# Patient Record
Sex: Female | Born: 1935 | Hispanic: No | Smoking: Former smoker
Health system: Southern US, Community
[De-identification: ages and names within clinical notes are randomized; demographics above are authoritative.]

## PROBLEM LIST (undated history)

## (undated) DIAGNOSIS — R410 Disorientation, unspecified: Secondary | ICD-10-CM

## (undated) DIAGNOSIS — I10 Essential (primary) hypertension: Secondary | ICD-10-CM

## (undated) DIAGNOSIS — R5383 Other fatigue: Secondary | ICD-10-CM

## (undated) DIAGNOSIS — R413 Other amnesia: Secondary | ICD-10-CM

## (undated) DIAGNOSIS — F0390 Unspecified dementia without behavioral disturbance: Secondary | ICD-10-CM

## (undated) DIAGNOSIS — K219 Gastro-esophageal reflux disease without esophagitis: Secondary | ICD-10-CM

## (undated) DIAGNOSIS — M722 Plantar fascial fibromatosis: Secondary | ICD-10-CM

## (undated) DIAGNOSIS — M7918 Myalgia, other site: Secondary | ICD-10-CM

## (undated) DIAGNOSIS — N6009 Solitary cyst of unspecified breast: Secondary | ICD-10-CM

## (undated) DIAGNOSIS — R739 Hyperglycemia, unspecified: Secondary | ICD-10-CM

## (undated) DIAGNOSIS — G43909 Migraine, unspecified, not intractable, without status migrainosus: Secondary | ICD-10-CM

## (undated) DIAGNOSIS — IMO0002 Reserved for concepts with insufficient information to code with codable children: Secondary | ICD-10-CM

## (undated) DIAGNOSIS — K635 Polyp of colon: Secondary | ICD-10-CM

## (undated) DIAGNOSIS — D649 Anemia, unspecified: Secondary | ICD-10-CM

## (undated) DIAGNOSIS — R58 Hemorrhage, not elsewhere classified: Secondary | ICD-10-CM

## (undated) DIAGNOSIS — R232 Flushing: Secondary | ICD-10-CM

## (undated) DIAGNOSIS — F32A Depression, unspecified: Secondary | ICD-10-CM

## (undated) DIAGNOSIS — Z853 Personal history of malignant neoplasm of breast: Secondary | ICD-10-CM

## (undated) DIAGNOSIS — K279 Peptic ulcer, site unspecified, unspecified as acute or chronic, without hemorrhage or perforation: Secondary | ICD-10-CM

## (undated) DIAGNOSIS — F329 Major depressive disorder, single episode, unspecified: Secondary | ICD-10-CM

## (undated) DIAGNOSIS — M8580 Other specified disorders of bone density and structure, unspecified site: Secondary | ICD-10-CM

## (undated) DIAGNOSIS — J302 Other seasonal allergic rhinitis: Secondary | ICD-10-CM

## (undated) DIAGNOSIS — N390 Urinary tract infection, site not specified: Secondary | ICD-10-CM

## (undated) HISTORY — PX: OTHER SURGICAL HISTORY: SHX169

## (undated) HISTORY — DX: Unspecified dementia, unspecified severity, without behavioral disturbance, psychotic disturbance, mood disturbance, and anxiety: F03.90

## (undated) HISTORY — DX: Gastro-esophageal reflux disease without esophagitis: K21.9

## (undated) HISTORY — DX: Essential (primary) hypertension: I10

## (undated) HISTORY — DX: Flushing: R23.2

## (undated) HISTORY — DX: Other specified disorders of bone density and structure, unspecified site: M85.80

## (undated) HISTORY — DX: Plantar fascial fibromatosis: M72.2

## (undated) HISTORY — DX: Anemia, unspecified: D64.9

## (undated) HISTORY — DX: Myalgia, other site: M79.18

## (undated) HISTORY — DX: Urinary tract infection, site not specified: N39.0

## (undated) HISTORY — DX: Migraine, unspecified, not intractable, without status migrainosus: G43.909

## (undated) HISTORY — DX: Hyperglycemia, unspecified: R73.9

## (undated) HISTORY — DX: Depression, unspecified: F32.A

## (undated) HISTORY — DX: Other seasonal allergic rhinitis: J30.2

## (undated) HISTORY — DX: Personal history of malignant neoplasm of breast: Z85.3

## (undated) HISTORY — DX: Reserved for concepts with insufficient information to code with codable children: IMO0002

## (undated) HISTORY — DX: Polyp of colon: K63.5

## (undated) HISTORY — DX: Peptic ulcer, site unspecified, unspecified as acute or chronic, without hemorrhage or perforation: K27.9

## (undated) HISTORY — DX: Major depressive disorder, single episode, unspecified: F32.9

## (undated) HISTORY — DX: Solitary cyst of unspecified breast: N60.09

## (undated) HISTORY — DX: Hemorrhage, not elsewhere classified: R58

## (undated) HISTORY — PX: DILATION AND CURETTAGE OF UTERUS: SHX78

## (undated) LAB — HEPATIC FUNCTION PANEL
ALT: 9 U/L
AST: 18 U/L
Albumin: 3.8 g/dL (ref 3.5–5.0)
Alkaline Phosphatase: 100 U/L
Total Bilirubin: 0.4 mg/dL (ref 0.1–1.4)
Total Protein: 6.8 g/dL (ref 6.4–8.2)

## (undated) LAB — CBC
Hematocrit: 38.3 % (ref 36–46)
Hemoglobin: 12.4 g/dL (ref 12.0–16.0)
MCV: 94.8 fL (ref 82.0–108.0)
Platelets: 218
RBC: 4.04 10*6/uL (ref 4.00–5.20)
WBC: 8.48 10^3/mL

## (undated) LAB — RENAL FUNCTION PANEL W/EGFR
Anion Gap: 12 mmol/L (ref ?–30)
BUN: 15 mg/dL (ref 4–21)
CO2: 24 mmol/L (ref 13–22)
Calcium: 9.4 mg/dL (ref 8.7–10.7)
Chloride: 101 mmol/L (ref 99–108)
Creatinine: 0.9 mg/dL (ref 0.5–1.1)
Potassium: 4.1 mmol/L (ref 3.4–5.3)
Sodium: 137 mmol/L (ref 137–147)
eGFR NONAA CKD-EPI: 58.9

## (undated) LAB — TSH: TSH: 2.06 u[IU]/mL (ref 0.41–5.90)

## (undated) LAB — GLUCOSE, RANDOM: Glucose: 130 mg/dL (ref 60–200)

---

## 1984-01-31 HISTORY — PX: DILATION AND CURETTAGE OF UTERUS: SHX78

## 1991-01-31 DIAGNOSIS — R58 Hemorrhage, not elsewhere classified: Secondary | ICD-10-CM

## 1991-01-31 HISTORY — DX: Hemorrhage, not elsewhere classified: R58

## 1992-08-30 DIAGNOSIS — K279 Peptic ulcer, site unspecified, unspecified as acute or chronic, without hemorrhage or perforation: Secondary | ICD-10-CM

## 1992-08-30 HISTORY — DX: Peptic ulcer, site unspecified, unspecified as acute or chronic, without hemorrhage or perforation: K27.9

## 1993-12-30 DIAGNOSIS — N6009 Solitary cyst of unspecified breast: Secondary | ICD-10-CM

## 1993-12-30 HISTORY — DX: Solitary cyst of unspecified breast: N60.09

## 1999-03-29 ENCOUNTER — Encounter: Payer: Self-pay | Admitting: *Deleted

## 1999-03-29 ENCOUNTER — Encounter: Admission: RE | Admit: 1999-03-29 | Discharge: 1999-03-29 | Payer: Self-pay | Admitting: *Deleted

## 2000-03-29 ENCOUNTER — Encounter: Payer: Self-pay | Admitting: *Deleted

## 2000-03-29 ENCOUNTER — Encounter: Admission: RE | Admit: 2000-03-29 | Discharge: 2000-03-29 | Payer: Self-pay | Admitting: *Deleted

## 2000-10-12 ENCOUNTER — Encounter: Admission: RE | Admit: 2000-10-12 | Discharge: 2000-10-12 | Payer: Self-pay | Admitting: *Deleted

## 2001-04-02 ENCOUNTER — Encounter: Payer: Self-pay | Admitting: *Deleted

## 2001-04-02 ENCOUNTER — Encounter: Admission: RE | Admit: 2001-04-02 | Discharge: 2001-04-02 | Payer: Self-pay | Admitting: *Deleted

## 2001-04-15 ENCOUNTER — Other Ambulatory Visit: Admission: RE | Admit: 2001-04-15 | Discharge: 2001-04-15 | Payer: Self-pay | Admitting: *Deleted

## 2002-04-04 ENCOUNTER — Encounter: Admission: RE | Admit: 2002-04-04 | Discharge: 2002-04-04 | Payer: Self-pay | Admitting: Internal Medicine

## 2002-04-04 ENCOUNTER — Encounter: Payer: Self-pay | Admitting: *Deleted

## 2002-04-18 ENCOUNTER — Other Ambulatory Visit: Admission: RE | Admit: 2002-04-18 | Discharge: 2002-04-18 | Payer: Self-pay | Admitting: *Deleted

## 2003-03-26 ENCOUNTER — Encounter (INDEPENDENT_AMBULATORY_CARE_PROVIDER_SITE_OTHER): Payer: Self-pay | Admitting: *Deleted

## 2003-03-26 ENCOUNTER — Ambulatory Visit (HOSPITAL_COMMUNITY): Admission: RE | Admit: 2003-03-26 | Discharge: 2003-03-26 | Payer: Self-pay | Admitting: Gastroenterology

## 2003-04-08 ENCOUNTER — Ambulatory Visit (HOSPITAL_COMMUNITY): Admission: RE | Admit: 2003-04-08 | Discharge: 2003-04-08 | Payer: Self-pay | Admitting: Internal Medicine

## 2003-04-20 ENCOUNTER — Other Ambulatory Visit: Admission: RE | Admit: 2003-04-20 | Discharge: 2003-04-20 | Payer: Self-pay | Admitting: *Deleted

## 2003-07-13 ENCOUNTER — Encounter: Admission: RE | Admit: 2003-07-13 | Discharge: 2003-07-13 | Payer: Self-pay | Admitting: Internal Medicine

## 2004-04-22 ENCOUNTER — Ambulatory Visit (HOSPITAL_COMMUNITY): Admission: RE | Admit: 2004-04-22 | Discharge: 2004-04-22 | Payer: Self-pay | Admitting: *Deleted

## 2004-04-28 ENCOUNTER — Other Ambulatory Visit: Admission: RE | Admit: 2004-04-28 | Discharge: 2004-04-28 | Payer: Self-pay | Admitting: *Deleted

## 2005-01-15 ENCOUNTER — Encounter: Admission: RE | Admit: 2005-01-15 | Discharge: 2005-01-15 | Payer: Self-pay | Admitting: Internal Medicine

## 2005-04-25 ENCOUNTER — Ambulatory Visit (HOSPITAL_COMMUNITY): Admission: RE | Admit: 2005-04-25 | Discharge: 2005-04-25 | Payer: Self-pay | Admitting: Obstetrics and Gynecology

## 2005-05-01 ENCOUNTER — Other Ambulatory Visit: Admission: RE | Admit: 2005-05-01 | Discharge: 2005-05-01 | Payer: Self-pay | Admitting: Obstetrics and Gynecology

## 2006-01-30 DIAGNOSIS — Z853 Personal history of malignant neoplasm of breast: Secondary | ICD-10-CM

## 2006-01-30 HISTORY — DX: Personal history of malignant neoplasm of breast: Z85.3

## 2006-01-30 HISTORY — PX: BREAST EXCISIONAL BIOPSY: SUR124

## 2006-04-27 ENCOUNTER — Ambulatory Visit (HOSPITAL_COMMUNITY): Admission: RE | Admit: 2006-04-27 | Discharge: 2006-04-27 | Payer: Self-pay | Admitting: Obstetrics & Gynecology

## 2006-05-08 ENCOUNTER — Encounter (INDEPENDENT_AMBULATORY_CARE_PROVIDER_SITE_OTHER): Payer: Self-pay | Admitting: Diagnostic Radiology

## 2006-05-08 ENCOUNTER — Encounter (INDEPENDENT_AMBULATORY_CARE_PROVIDER_SITE_OTHER): Payer: Self-pay | Admitting: Specialist

## 2006-05-08 ENCOUNTER — Encounter: Admission: RE | Admit: 2006-05-08 | Discharge: 2006-05-08 | Payer: Self-pay | Admitting: Obstetrics & Gynecology

## 2006-05-10 ENCOUNTER — Other Ambulatory Visit: Admission: RE | Admit: 2006-05-10 | Discharge: 2006-05-10 | Payer: Self-pay | Admitting: Obstetrics & Gynecology

## 2006-05-14 ENCOUNTER — Encounter: Admission: RE | Admit: 2006-05-14 | Discharge: 2006-05-14 | Payer: Self-pay | Admitting: Obstetrics & Gynecology

## 2006-06-04 ENCOUNTER — Encounter: Admission: RE | Admit: 2006-06-04 | Discharge: 2006-06-04 | Payer: Self-pay | Admitting: Surgery

## 2006-06-06 ENCOUNTER — Ambulatory Visit (HOSPITAL_BASED_OUTPATIENT_CLINIC_OR_DEPARTMENT_OTHER): Admission: RE | Admit: 2006-06-06 | Discharge: 2006-06-06 | Payer: Self-pay | Admitting: Surgery

## 2006-06-06 ENCOUNTER — Encounter (INDEPENDENT_AMBULATORY_CARE_PROVIDER_SITE_OTHER): Payer: Self-pay | Admitting: Specialist

## 2006-06-06 ENCOUNTER — Encounter: Payer: Self-pay | Admitting: Surgery

## 2006-06-06 ENCOUNTER — Encounter: Admission: RE | Admit: 2006-06-06 | Discharge: 2006-06-06 | Payer: Self-pay | Admitting: Surgery

## 2006-06-06 HISTORY — PX: BREAST LUMPECTOMY: SHX2

## 2006-06-08 ENCOUNTER — Ambulatory Visit: Payer: Self-pay | Admitting: Oncology

## 2006-06-11 ENCOUNTER — Ambulatory Visit: Admission: RE | Admit: 2006-06-11 | Discharge: 2006-08-15 | Payer: Self-pay | Admitting: Radiation Oncology

## 2006-10-05 ENCOUNTER — Encounter: Admission: RE | Admit: 2006-10-05 | Discharge: 2006-10-05 | Payer: Self-pay | Admitting: General Surgery

## 2006-10-08 ENCOUNTER — Ambulatory Visit: Payer: Self-pay | Admitting: Oncology

## 2007-04-29 ENCOUNTER — Encounter: Admission: RE | Admit: 2007-04-29 | Discharge: 2007-04-29 | Payer: Self-pay | Admitting: Internal Medicine

## 2007-06-12 ENCOUNTER — Other Ambulatory Visit: Admission: RE | Admit: 2007-06-12 | Discharge: 2007-06-12 | Payer: Self-pay | Admitting: Obstetrics & Gynecology

## 2007-09-30 ENCOUNTER — Ambulatory Visit: Payer: Self-pay | Admitting: Oncology

## 2007-10-03 LAB — CBC WITH DIFFERENTIAL/PLATELET
BASO%: 0.2 % (ref 0.0–2.0)
Eosinophils Absolute: 0.1 10*3/uL (ref 0.0–0.5)
MCHC: 34.4 g/dL (ref 32.0–36.0)
MONO#: 0.4 10*3/uL (ref 0.1–0.9)
NEUT#: 3.8 10*3/uL (ref 1.5–6.5)
RBC: 4.02 10*6/uL (ref 3.70–5.32)
WBC: 6.1 10*3/uL (ref 3.9–10.0)
lymph#: 1.8 10*3/uL (ref 0.9–3.3)

## 2007-10-04 LAB — COMPREHENSIVE METABOLIC PANEL
ALT: <8 U/L (ref 0–35)
Albumin: 4.1 g/dL (ref 3.5–5.2)
CO2: 29 mEq/L (ref 19–32)
Calcium: 9.3 mg/dL (ref 8.4–10.5)
Chloride: 99 mEq/L (ref 96–112)
Potassium: 3.8 mEq/L (ref 3.5–5.3)
Sodium: 137 mEq/L (ref 135–145)
Total Protein: 6.9 g/dL (ref 6.0–8.3)

## 2008-04-30 ENCOUNTER — Encounter: Admission: RE | Admit: 2008-04-30 | Discharge: 2008-04-30 | Payer: Self-pay | Admitting: Internal Medicine

## 2008-11-16 ENCOUNTER — Ambulatory Visit: Payer: Self-pay | Admitting: Oncology

## 2008-11-18 LAB — COMPREHENSIVE METABOLIC PANEL
Albumin: 3.7 g/dL (ref 3.5–5.2)
Alkaline Phosphatase: 83 U/L (ref 39–117)
BUN: 15 mg/dL (ref 6–23)
Calcium: 9.2 mg/dL (ref 8.4–10.5)
Creatinine, Ser: 1.04 mg/dL (ref 0.40–1.20)
Glucose, Bld: 90 mg/dL (ref 70–99)
Potassium: 3.6 mEq/L (ref 3.5–5.3)

## 2008-11-18 LAB — CBC WITH DIFFERENTIAL/PLATELET
Basophils Absolute: 0 10*3/uL (ref 0.0–0.1)
EOS%: 1.4 % (ref 0.0–7.0)
Eosinophils Absolute: 0.1 10*3/uL (ref 0.0–0.5)
HCT: 34.2 % — ABNORMAL LOW (ref 34.8–46.6)
HGB: 11.6 g/dL (ref 11.6–15.9)
MCH: 30.8 pg (ref 25.1–34.0)
MCV: 90.9 fL (ref 79.5–101.0)
MONO%: 13.1 % (ref 0.0–14.0)
NEUT#: 3.1 10*3/uL (ref 1.5–6.5)
NEUT%: 54.7 % (ref 38.4–76.8)
Platelets: 238 10*3/uL (ref 145–400)
RDW: 13.6 % (ref 11.2–14.5)

## 2008-11-19 LAB — VITAMIN D 25 HYDROXY (VIT D DEFICIENCY, FRACTURES): Vit D, 25-Hydroxy: 25 ng/mL — ABNORMAL LOW (ref 30–89)

## 2009-05-03 ENCOUNTER — Encounter: Admission: RE | Admit: 2009-05-03 | Discharge: 2009-05-03 | Payer: Self-pay | Admitting: Surgery

## 2010-03-10 ENCOUNTER — Other Ambulatory Visit: Payer: Self-pay | Admitting: Internal Medicine

## 2010-03-10 DIAGNOSIS — N6315 Unspecified lump in the right breast, overlapping quadrants: Secondary | ICD-10-CM

## 2010-03-15 ENCOUNTER — Other Ambulatory Visit: Payer: Self-pay

## 2010-03-17 ENCOUNTER — Ambulatory Visit
Admission: RE | Admit: 2010-03-17 | Discharge: 2010-03-17 | Disposition: A | Discharge status: Home / Self Care | Payer: Medicare Other | Source: Ambulatory Visit | Attending: Internal Medicine | Admitting: Internal Medicine

## 2010-03-17 ENCOUNTER — Other Ambulatory Visit: Payer: Self-pay | Admitting: Internal Medicine

## 2010-03-17 DIAGNOSIS — N6315 Unspecified lump in the right breast, overlapping quadrants: Secondary | ICD-10-CM

## 2010-04-21 ENCOUNTER — Other Ambulatory Visit: Payer: Self-pay | Admitting: Surgery

## 2010-04-21 DIAGNOSIS — Z9889 Other specified postprocedural states: Secondary | ICD-10-CM

## 2010-05-09 ENCOUNTER — Ambulatory Visit
Admission: RE | Admit: 2010-05-09 | Discharge: 2010-05-09 | Disposition: A | Discharge status: Home / Self Care | Payer: Medicare Other | Source: Ambulatory Visit | Attending: Surgery | Admitting: Surgery

## 2010-05-09 DIAGNOSIS — Z9889 Other specified postprocedural states: Secondary | ICD-10-CM

## 2010-06-14 NOTE — Op Note (Signed)
[name], [name]            ACCOUNT NO.:  [id number]   MEDICAL RECORD NO.:  [id number]          PATIENT TYPE:  AMB   LOCATION:  DSC                          FACILITY:  [facility]   PHYSICIAN:  Christine Maldonado, M.D.DATE OF BIRTH:  09-30-1928   DATE OF PROCEDURE:  06/06/2006  DATE OF DISCHARGE:                               OPERATIVE REPORT   OFFICE MEDICAL RECORD NUMBER [phone].   PREOPERATIVE DIAGNOSIS:  Carcinoma, right breast upper inner quadrant.   POSTOPERATIVE DIAGNOSIS:  Carcinoma, right breast upper inner quadrant.   OPERATION:  Needle-guided partial mastectomy with blue dye injection and  axillary sentinel lymph node biopsy (right side).   SURGEON:  Christine Maldonado, M.D.   ANESTHESIA:  General.   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  After a lengthy discussion with the  patient, she elected to proceed to needle-localized excision with plans  for sentinel lymph node evaluation.   DESCRIPTION OF PROCEDURE:  The patient was seen in the holding area and  she had no further questions.  She was already injected with her  radioisotope at the time I saw her.  Her guidewires had already been  placed and I reviewed the films.  Both guidewires entered fairly  medially and tracked towards the areola and were both about the 3  o'clock radial.  In reviewing the films, it looked like the area in  question was directly underneath the entry point of the more lateral of  the two guidewires.   The patient was then taken to the operating room and after satisfactory  general anesthesia had been obtained, the time-out occurred.  The breast  was prepped some alcohol and 5 mL of dilute methylene blue was injected  subareolarly and massaged in.   The breast was then prepped and draped.  I made a transverse elliptical  incision starting medially  at the guidewire entry site and then going  all the way to almost the areolar edge so that I was well beyond the  entry site of the more lateral of the two guidewires.  I raised a little  flap on the medial aspect of the incision superiorly inferiorly and  divided down to the chest wall so that I could start taking this area  out.  I then increased the length of the excision going from medial to  lateral down to the chest wall in all directions around the guidewire.  Once I had it freed up medially, superiorly and inferiorly, I was able  with some traction on it and divide the final-most more lateral or  subareolar aspects of this and I actually took tissue from underneath  the areola so that I was beyond or at the tip of the more lateral of the  two guidewires.  During the manipulation, this guidewire slid through  the tissue and it almost exited out the deep end.   Everything appeared to be dry.  I injected some Marcaine to help with  postop analgesia.  I placed a moist pack and turned my attention to the  lymph nodes.   Using the NeoProbe I identified a hot area and made a transverse  incision.  I placed a self-retaining retractor and divided some the  subcutaneous tissues and almost immediately identified a small blue  lymphatic, which I traced to a blue lymph node.  This was grasped with a  Babcock and excised and had counts going up to about 135.  Using the  NeoProbe, I saw another hot area that was just a little bit more  posterior to the first and a little dissection showed another little  blue lymphatic leading to what looked like a tiny blue lymph node.  I  grasped that with a Babcock and as I was manipulating that out, I could  see another blue lymphatic leading from this area further into the  higher axilla.  Using the NeoProbe, there was a hot area in that  vicinity so I then dissected a little further and found another blue  lymph node, which was grasped with a Babcock and  these second and third  nodes were then removed.  This was all done with cautery.   Upon removing the third node, all background counts dropped to about 0-  1.  There were no palpable lymph nodes that were enlarged and no other  blue lymph nodes or blue lymphatics noted.   I placed some Marcaine and I made sure everything was dry and closed in  layers with 3-0 Vicryl and 4-0 Monocryl subcuticular.   Attention was turned back to the breast incision.  It had remained dry  while we were working on the axilla, and it was closed in layers with  some 3-0 Vicryl to try to close the subcutaneous tissues and 4-0  Monocryl on the skin.  I tried to leave a cavity in case the patient was  a candidate for MammoSite radiation.   Pathology reported that the lymph nodes were negative for metastatic  disease, although the small second node was just a bit of lymphatic  tissue and it was not clear if this was actually a lymph node.  Radiology reported that the mass calcifications appeared to be in the  specimen mammogram.   The incisions were then covered with Dermabond.  Sterile dressings were  applied.  The patient tolerated the procedure well.  There were no  operative complications.  All counts were correct.      Christine Maldonado, M.D.  Electronically Signed     CJS/MEDQ  D:  06/06/2006  T:  06/06/2006  Job:  [id number]   cc:   Juline Patch, M.D.

## 2010-07-07 ENCOUNTER — Encounter (INDEPENDENT_AMBULATORY_CARE_PROVIDER_SITE_OTHER): Payer: Self-pay | Admitting: Surgery

## 2011-04-05 ENCOUNTER — Other Ambulatory Visit: Payer: Self-pay | Admitting: Obstetrics & Gynecology

## 2011-04-05 DIAGNOSIS — Z853 Personal history of malignant neoplasm of breast: Secondary | ICD-10-CM

## 2011-05-10 ENCOUNTER — Ambulatory Visit
Admission: RE | Admit: 2011-05-10 | Discharge: 2011-05-10 | Disposition: A | Discharge status: Home / Self Care | Payer: Medicare Other | Source: Ambulatory Visit | Attending: Obstetrics & Gynecology | Admitting: Obstetrics & Gynecology

## 2011-05-10 DIAGNOSIS — Z853 Personal history of malignant neoplasm of breast: Secondary | ICD-10-CM

## 2011-06-27 ENCOUNTER — Other Ambulatory Visit: Payer: Self-pay | Admitting: Internal Medicine

## 2011-06-27 ENCOUNTER — Encounter (INDEPENDENT_AMBULATORY_CARE_PROVIDER_SITE_OTHER): Payer: Self-pay | Admitting: General Surgery

## 2011-06-27 DIAGNOSIS — Z853 Personal history of malignant neoplasm of breast: Secondary | ICD-10-CM | POA: Insufficient documentation

## 2011-06-27 DIAGNOSIS — M7989 Other specified soft tissue disorders: Secondary | ICD-10-CM

## 2011-06-28 ENCOUNTER — Ambulatory Visit (INDEPENDENT_AMBULATORY_CARE_PROVIDER_SITE_OTHER): Payer: Self-pay | Admitting: Surgery

## 2011-06-28 ENCOUNTER — Ambulatory Visit (INDEPENDENT_AMBULATORY_CARE_PROVIDER_SITE_OTHER): Payer: Medicare Other | Admitting: Surgery

## 2011-06-28 ENCOUNTER — Encounter (INDEPENDENT_AMBULATORY_CARE_PROVIDER_SITE_OTHER): Payer: Self-pay | Admitting: Surgery

## 2011-06-28 VITALS — BP 130/76 | HR 100 | Resp 20 | Ht 68.0 in | Wt 200.0 lb

## 2011-06-28 DIAGNOSIS — Z853 Personal history of malignant neoplasm of breast: Secondary | ICD-10-CM

## 2011-06-28 NOTE — Progress Notes (Signed)
NAME: Christine Maldonado       DOB: 03-15-1928           DATE: 06/28/2011       MRN: [id number]   Christine Maldonado is a 78 y.o.Marland Kitchenfemale who presents for routine followup of her Right breast cancer, IDC, receptor +diagnosed in 2008 and treated with lumpectomy and tamoxifen. She stopped the tamoxifen due to side effects. She has no problems or concerns on either side.  PFSH: She has had no significant changes since the last visit here.  ROS: There have been no significant changes since the last visit here  EXAM: General: The patient is alert, oriented, generally healty appearing, NAD. Mood and affect are normal.  Breasts:  Mild loss of tissue at the lumpectomy site but no other abnormalities  Lymphatics: She has no axillary or supraclavicular adenopathy on either side.  Extremities: Full ROM of the surgical side with no lymphedema noted.  Data Reviewed: Mammogram in March was OK  Impression: Doing well, with no evidence of recurrent cancer or new cancer  Plan: RTC PRN

## 2011-06-29 ENCOUNTER — Ambulatory Visit
Admission: RE | Admit: 2011-06-29 | Discharge: 2011-06-29 | Disposition: A | Discharge status: Home / Self Care | Payer: Medicare Other | Source: Ambulatory Visit | Attending: Internal Medicine | Admitting: Internal Medicine

## 2011-06-29 DIAGNOSIS — M7989 Other specified soft tissue disorders: Secondary | ICD-10-CM

## 2011-09-13 ENCOUNTER — Other Ambulatory Visit: Payer: Self-pay | Admitting: Internal Medicine

## 2011-09-13 DIAGNOSIS — R519 Headache, unspecified: Secondary | ICD-10-CM

## 2011-09-16 ENCOUNTER — Ambulatory Visit
Admission: RE | Admit: 2011-09-16 | Discharge: 2011-09-16 | Disposition: A | Discharge status: Home / Self Care | Payer: Medicare Other | Source: Ambulatory Visit | Attending: Internal Medicine | Admitting: Internal Medicine

## 2011-09-16 DIAGNOSIS — R519 Headache, unspecified: Secondary | ICD-10-CM

## 2012-06-04 ENCOUNTER — Other Ambulatory Visit: Payer: Self-pay | Admitting: Registered Nurse

## 2012-06-25 ENCOUNTER — Other Ambulatory Visit: Payer: Self-pay | Admitting: Internal Medicine

## 2012-06-25 ENCOUNTER — Other Ambulatory Visit: Payer: Self-pay | Admitting: Obstetrics & Gynecology

## 2012-06-25 DIAGNOSIS — Z853 Personal history of malignant neoplasm of breast: Secondary | ICD-10-CM

## 2012-06-30 LAB — HM PAP SMEAR: HM Pap smear: NORMAL

## 2012-06-30 LAB — HM MAMMOGRAPHY: HM Mammogram: NORMAL

## 2012-07-19 ENCOUNTER — Ambulatory Visit
Admission: RE | Admit: 2012-07-19 | Discharge: 2012-07-19 | Disposition: A | Discharge status: Home / Self Care | Payer: Medicare Other | Source: Ambulatory Visit | Attending: Internal Medicine | Admitting: Internal Medicine

## 2012-07-19 DIAGNOSIS — Z853 Personal history of malignant neoplasm of breast: Secondary | ICD-10-CM

## 2012-11-11 ENCOUNTER — Encounter: Payer: Self-pay | Admitting: Obstetrics & Gynecology

## 2012-11-11 ENCOUNTER — Ambulatory Visit (INDEPENDENT_AMBULATORY_CARE_PROVIDER_SITE_OTHER): Payer: Medicare Other | Admitting: Obstetrics & Gynecology

## 2012-11-11 VITALS — BP 138/82 | HR 68 | Resp 12 | Ht 66.75 in | Wt 199.6 lb

## 2012-11-11 DIAGNOSIS — Z01419 Encounter for gynecological examination (general) (routine) without abnormal findings: Secondary | ICD-10-CM

## 2012-11-11 DIAGNOSIS — Z124 Encounter for screening for malignant neoplasm of cervix: Secondary | ICD-10-CM

## 2012-11-11 NOTE — Progress Notes (Signed)
79 y.o. G2P2 WidowedCaucasianF here for annual exam.  No vaginal bleeding.  Big issues for patient this past year are knee pain, piriformis spasms, and plantar fascitiis.  Exercising at the [facility] with machines.  Dr. Ricki  is PCP.  Sees every six months.    Patient's last menstrual period was 01/30/1993.          Sexually active: no  The current method of family planning is none.    Exercising: yes  walking and machines at [place] Smoker:  no  Health Maintenance: Pap:  08/18/10 WNL History of abnormal Pap:  no MMG:  07/19/12 diag in one year Colonoscopy:  2014-no repeat, Dr. Ewing Schlein BMD:   2006 Dr Ricki  (0.3/-0.7) TDaP:  7/07  Screening Labs: PCP, Hb today: PCP, Urine today: PCP   reports that she quit smoking about 47 years ago. She has never used smokeless tobacco. She reports that she does not drink alcohol or use illicit drugs.  Past Medical History  Diagnosis Date  . History of breast cancer 2008    DCIS/invasive ductal cancer right breast ER+/PR+  . Hypertension   . Anemia   . PUD (peptic ulcer disease) 8/94    Dr Magod-endoscopy  . Breast cyst 12/95    right breast  . Colon polyps   . Osteopenia   . Squamous cell carcinoma 6/11 7/11    on leg-removed  . Bleeding 1993    BTB on HRT-endo biopsy proe with focal breakdown  . Bleeding 1995    BTB on HRT-benign endo biopsy  . Muscle pain     in legs    Past Surgical History  Procedure Laterality Date  . Dilation and curettage of uterus  50+ years ago    for retained placenta  . Breast lumpectomy  06/06/2006    right and sentinel node biopsy  . Dilation and curettage of uterus  1986    Current Outpatient Prescriptions  Medication Sig Dispense Refill  . Ascorbic Acid (VITAMIN C PO) Take by mouth.        Marland Kitchen CALCIUM PO Take by mouth daily.       Marland Kitchen Fexofenadine HCl (ALLEGRA PO) Take by mouth. 1/2 tablet daily      . fish oil-omega-3 fatty acids 1000 MG capsule Take 2 g by mouth daily.      . hydrochlorothiazide 25 MG tablet Take 25  mg by mouth daily.        . meloxicam (MOBIC) 15 MG tablet 15 mg as needed.      . Multiple Vitamin (MULTIVITAMIN) capsule Take 1 capsule by mouth daily.        . Venlafaxine HCl (EFFEXOR PO) Take 75 mg by mouth 2 (two) times daily.       Marland Kitchen VITAMIN D, CHOLECALCIFEROL, PO Take 4,000 Int'l Units by mouth.        No current facility-administered medications for this visit.    Family History  Problem Relation Age of Onset  . Heart attack Brother   . Diabetes Other     granddaughter  . Kidney failure Sister     ROS:  Pertinent items are noted in HPI.  Otherwise, a comprehensive ROS was negative.  Exam:   BP 138/82  Pulse 68  Resp 12  Ht 5' 6.75" (1.695 m)  Wt 199 lb 9.6 oz (90.538 kg)  BMI 31.51 kg/m2  LMP 01/30/1993  Weight change: +1lb   Height: 5' 6.75" (169.5 cm)  Ht Readings from Last 3 Encounters:  11/11/12 5' 6.75" (1.695 m)  06/28/11 5\' 8"  (1.727 m)    General appearance: alert, cooperative and appears stated age Head: Normocephalic, without obvious abnormality, atraumatic Neck: no adenopathy, supple, symmetrical, trachea midline and thyroid normal to inspection and palpation Lungs: clear to auscultation bilaterally Breasts: normal appearance, no masses or tenderness Heart: regular rate and rhythm Abdomen: soft, non-tender; bowel sounds normal; no masses,  no organomegaly Extremities: extremities normal, atraumatic, no cyanosis or edema Skin: Skin color, texture, turgor normal. No rashes or lesions Lymph nodes: Cervical, supraclavicular, and axillary nodes normal. No abnormal inguinal nodes palpated Neurologic: Grossly normal   Pelvic: External genitalia:  no lesions              Urethra:  normal appearing urethra with no masses, tenderness or lesions              Bartholins and Skenes: normal                 Vagina: normal appearing vagina with normal color and discharge, no lesions              Cervix: no lesions              Pap taken: yes Bimanual Exam:   Uterus:  normal size, contour, position, consistency, mobility, non-tender              Adnexa: normal adnexa and no mass, fullness, tenderness               Rectovaginal: Confirms               Anus:  normal sphincter tone, no lesions  A:  Well Woman with normal exam PMP, no HRT H/o breast cancer 2008 s/p lumpectomy and neg sentinel node biopsy Squamous cell carcinoma, sees derm every six months  P:   Mammogram yearly.  Does diagnostic. pap smear today Dr. Ricki  checks all lab work Release of colonoscopy signed from Dr. Ewing Schlein return annually or prn  An After Visit Summary was printed and given to the patient.

## 2012-11-11 NOTE — Patient Instructions (Signed)

## 2012-11-18 ENCOUNTER — Encounter: Payer: Self-pay | Admitting: Obstetrics & Gynecology

## 2012-11-18 ENCOUNTER — Ambulatory Visit (INDEPENDENT_AMBULATORY_CARE_PROVIDER_SITE_OTHER): Payer: Medicare Other | Admitting: Obstetrics & Gynecology

## 2012-11-18 ENCOUNTER — Telehealth: Payer: Self-pay | Admitting: Obstetrics & Gynecology

## 2012-11-18 VITALS — BP 124/78 | HR 64 | Resp 16 | Ht 66.75 in | Wt 203.8 lb

## 2012-11-18 DIAGNOSIS — N390 Urinary tract infection, site not specified: Secondary | ICD-10-CM

## 2012-11-18 DIAGNOSIS — B373 Candidiasis of vulva and vagina: Secondary | ICD-10-CM

## 2012-11-18 LAB — POCT URINALYSIS DIPSTICK
Glucose, UA: NEGATIVE
Nitrite, UA: NEGATIVE
Protein, UA: NEGATIVE
Urobilinogen, UA: NEGATIVE

## 2012-11-18 MED ORDER — TERCONAZOLE 0.4 % VA CREA: 1.0000 | TOPICAL_CREAM | Freq: Every day | VAGINAL | Status: DC

## 2012-11-18 NOTE — Patient Instructions (Signed)
We will call with the urine culture results. 

## 2012-11-18 NOTE — Telephone Encounter (Signed)
Spoke with patient. She states that she is having strong smelling, cloudy urine with vaginal itching "way up inside" x 6 days. No vaginal discharge, no fevers, no flank pain. Patient agreeable to office visit today. Scheduled.

## 2012-11-18 NOTE — Telephone Encounter (Signed)
Patient came in last Monday for aex and ever since she feels like she amy have a yeast infection very itchy and urine is cloudy. No pain at all.

## 2012-11-18 NOTE — Progress Notes (Signed)
Subjective:     Patient ID: TOLA MEAS, female   DOB: 10/19/1928, 79 y.o.   MRN: [id number]  HPI 79 yo WF here due to vaginal irritation, mild vaginal discharge, and some urinary urgency.  This is not sever and she reports she is going on a trip within a week and was afraid to wait and see what happens.  No hematuria.  No fever.  Does use some olive oil for vaginal dryness and last used this yesterday.    Review of Systems  Constitutional: Negative for fever.  Genitourinary: Positive for urgency and frequency. Negative for dysuria, hematuria, flank pain, menstrual problem and pelvic pain.       Objective:   Physical Exam  Constitutional: She is oriented to person, place, and time. She appears well-developed and well-nourished.  Abdominal: Soft. Bowel sounds are normal. She exhibits no distension and no mass. There is no tenderness. There is no rebound and no guarding.  Genitourinary: Vaginal discharge found.  Neurological: She is alert and oriented to person, place, and time.  Skin: Skin is warm and dry.  Psychiatric: She has a normal mood and affect.   Wet smear: Ph 5.0, saline with atrohpic cells, koh with occasional yeast, negative trich, neg whiff    Assessment:     Yeast vaginitis  Possible early UTI   Plan:     Urine culture Terazol 7 one applicator full qhs x 7 days.

## 2012-11-20 ENCOUNTER — Telehealth: Payer: Self-pay | Admitting: Emergency Medicine

## 2012-11-20 MED ORDER — CIPROFLOXACIN HCL 500 MG PO TABS: 500.0000 mg | ORAL_TABLET | Freq: Two times a day (BID) | ORAL | Status: DC

## 2012-11-20 NOTE — Telephone Encounter (Signed)
Spoke with patient. Message from Dr. Hyacinth Meeker given.  Patient verbalized understanding. Will be out of town for one week. TOC appointment scheduled for 11/3. Patient agreeable to plan.

## 2012-11-20 NOTE — Addendum Note (Signed)
Addended by: Jerene Bears on: 11/20/2012 09:10 AM   Modules accepted: Orders

## 2012-11-20 NOTE — Telephone Encounter (Signed)
Message copied by Elwin Tsou, Marc Morgans on Wed Nov 20, 2012 10:00 AM ------      Message from: Jerene Bears      Created: Wed Nov 20, 2012  9:09 AM       Please call pt.  Urine culture positive.  Needs treatment.  Allergic to amoxicillin and erythromycin.  Ciprofloxin 500mg  BID x 7 days.  Needs TOC urine culture and recheck with me in about 10 days.  Thanks. ------

## 2012-11-21 LAB — URINE CULTURE

## 2012-12-02 ENCOUNTER — Encounter: Payer: Self-pay | Admitting: Obstetrics & Gynecology

## 2012-12-02 ENCOUNTER — Ambulatory Visit (INDEPENDENT_AMBULATORY_CARE_PROVIDER_SITE_OTHER): Payer: Medicare Other | Admitting: Obstetrics & Gynecology

## 2012-12-02 VITALS — BP 160/88 | HR 68 | Resp 16 | Ht 66.75 in | Wt 204.8 lb

## 2012-12-02 DIAGNOSIS — N390 Urinary tract infection, site not specified: Secondary | ICD-10-CM

## 2012-12-02 NOTE — Patient Instructions (Signed)
Please call if you have any changes to your symptoms.

## 2012-12-02 NOTE — Progress Notes (Signed)
Subjective:     Patient ID: JENNIGER FIGIEL, female   DOB: 10/06/1928, 79 y.o.   MRN: [id number]  HPI 79 yo WF here for f/u after being seen on 11/18/12 with vaginitis and urinary urgency symptoms.  Urine culture was positive for E. Coli.  Pt treated for 7 days with cipro 500mg  bid but pt didn't take but 5 full days as she ended up needing to go to [place] for a funeral.  Her symptoms were completely resolved at this point so she felt she didn't need to take all 7 days.  No fevers.  No back pain.  No vaginal discharge or bleeding.    Review of Systems  All other systems reviewed and are negative.       Objective:   Physical Exam  Constitutional: She appears well-developed and well-nourished.  Abdominal: Soft. Bowel sounds are normal. She exhibits no distension. There is no tenderness.       Assessment:     UTI     Plan:     Urine culture pending.  Pt finished 5 days of antibiotics.

## 2012-12-03 LAB — URINE CULTURE
Colony Count: NO GROWTH
Organism ID, Bacteria: NO GROWTH

## 2012-12-05 ENCOUNTER — Telehealth: Payer: Self-pay | Admitting: Emergency Medicine

## 2012-12-05 NOTE — Telephone Encounter (Signed)
Message copied by Joeseph Amor on Thu Dec 05, 2012 10:54 AM ------      Message from: Jerene Bears      Created: Wed Dec 04, 2012  5:58 AM       Please inform pt urine culture is negative.  Pt did not finish all of antibiotics but this is fine as culture is negative. ------

## 2012-12-05 NOTE — Telephone Encounter (Signed)
Message left to return call to [place] at [phone].   Detailed message left to advise of message from Dr. Hyacinth Meeker on voicemail with name.

## 2012-12-12 ENCOUNTER — Telehealth: Payer: Self-pay | Admitting: Obstetrics & Gynecology

## 2012-12-12 NOTE — Telephone Encounter (Signed)
Patient is calling to what all we will accept on [employer] she just has some questions about coverage.

## 2013-03-05 ENCOUNTER — Telehealth: Payer: Self-pay | Admitting: Family Medicine

## 2013-03-05 ENCOUNTER — Ambulatory Visit (INDEPENDENT_AMBULATORY_CARE_PROVIDER_SITE_OTHER): Payer: Medicare HMO | Admitting: Family Medicine

## 2013-03-05 ENCOUNTER — Encounter: Payer: Self-pay | Admitting: Family Medicine

## 2013-03-05 VITALS — BP 120/80 | Temp 97.8°F | Ht 67.0 in | Wt 204.0 lb

## 2013-03-05 DIAGNOSIS — I10 Essential (primary) hypertension: Secondary | ICD-10-CM

## 2013-03-05 DIAGNOSIS — F329 Major depressive disorder, single episode, unspecified: Secondary | ICD-10-CM

## 2013-03-05 DIAGNOSIS — F3289 Other specified depressive episodes: Secondary | ICD-10-CM

## 2013-03-05 DIAGNOSIS — Z85828 Personal history of other malignant neoplasm of skin: Secondary | ICD-10-CM | POA: Insufficient documentation

## 2013-03-05 DIAGNOSIS — F32A Depression, unspecified: Secondary | ICD-10-CM | POA: Insufficient documentation

## 2013-03-05 NOTE — Telephone Encounter (Signed)
Pt has been sch

## 2013-03-05 NOTE — Telephone Encounter (Signed)
Please call patient and schedule an appointment for a medicare wellness visit

## 2013-03-05 NOTE — Telephone Encounter (Signed)
Pt stated her last cpx was on 08-07-2011 with dr Minna Antis.

## 2013-03-05 NOTE — Patient Instructions (Signed)
We recommend the following healthy lifestyle measures: - eat a healthy diet consisting of lots of vegetables, fruits, beans, nuts, seeds, healthy meats such as white chicken and fish and whole grains.  - avoid fried foods, fast food, processed foods, sodas, red meet and other fattening foods.  - get a least 150 minutes of aerobic exercise per week.   -follow up for medicare wellness exam

## 2013-03-05 NOTE — Progress Notes (Signed)
Chief Complaint  Patient presents with  . Establish Care    HPI:  MIKKA KISSNER is here to establish care. Prior PCP stopped taking her insurance. Last PCP and physical: sees Contractor in gyn, still gets paps? Sees Dr. Margot Chimes for long term follow up of breast Ca  Has the following chronic problems and concerns today:  Patient Active Problem List   Diagnosis Date Noted  . Essential hypertension, benign 03/05/2013  . Hx of nonmelanoma skin cancer - followed by skin surgery center 03/05/2013  . Depression 03/05/2013  . hx: breast cancer, right IDC receptor + her 2 - 06/27/2011   HTN: -stable on HCTZ  Depression: -on many different meds in the past and seem effexor has worked well for her -stable  Health Maintenance: -unsure of vaccine  ROS: See pertinent positives and negatives per HPI.  Past Medical History  Diagnosis Date  . History of breast cancer 2008    DCIS/invasive ductal cancer right breast ER+/PR+  . Hypertension   . Anemia   . PUD (peptic ulcer disease) 8/94    Dr Magod-endoscopy  . Breast cyst 12/95    right breast  . Colon polyps   . Osteopenia   . Squamous cell carcinoma 6/11 7/11    on leg-removed  . Bleeding 1993    BTB on HRT-endo biopsy proe with focal breakdown  . Bleeding 1995    BTB on HRT-benign endo biopsy  . Piriformis muscle pain   . Plantar fasciitis   . Depression   . Ulcer   . Seasonal allergies   . UTI (urinary tract infection)   . Migraine     Family History  Problem Relation Age of Onset  . Heart attack Brother   . Diabetes Other     granddaughter  . Kidney failure Sister   . Arthritis Father   . Ovarian cancer Mother   . Hypertension Mother     History   Social History  . Marital Status: Widowed    Spouse Name: N/A    Number of Children: N/A  . Years of Education: N/A   Social History Main Topics  . Smoking status: Former Smoker    Quit date: 06/27/1965  . Smokeless tobacco: Never Used  . Alcohol  Use: No  . Drug Use: No  . Sexual Activity: No   Other Topics Concern  . 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 , Disp: , Rfl: ;  CALCIUM PO, Take 600 mg by mouth daily. , Disp: , Rfl: ;  Coenzyme Q10 (CO Q 10 PO), Take by mouth daily., Disp: , Rfl: ;  CRANBERRY PO, Take 84 mg by mouth daily. , Disp: , Rfl: ;  docusate sodium (COLACE) 100 MG capsule, Take 100 mg by mouth 2 (two) times daily., Disp: , Rfl:  Fexofenadine HCl (ALLEGRA PO), Take by mouth. 1/2 tablet daily, Disp: , Rfl: ;  fish oil-omega-3 fatty acids 1000 MG capsule, Take 2 g by mouth daily., Disp: , Rfl: ;  Garlic 2703 MG CAPS, Take 1,000 mg by mouth daily., Disp: , Rfl: ;  hydrochlorothiazide 25 MG tablet, Take 25 mg by mouth daily.  , Disp: , Rfl: ;  Multiple Vitamin (MULTIVITAMIN) capsule, Take 1 capsule by mouth daily.  , Disp: , Rfl:  Probiotic Product (PROBIOTIC DAILY PO), Take by mouth daily., Disp: , Rfl: ;  Venlafaxine HCl (EFFEXOR PO), Take 75 mg by mouth 2 (two) times daily. , Disp: , Rfl: ;  VITAMIN D, CHOLECALCIFEROL, PO, Take 4,000 Int'l Units by mouth. , Disp: , Rfl:   EXAM:  Filed Vitals:   03/05/13 1111  BP: 120/80  Temp: 97.8 F (36.6 C)    Body mass index is 31.94 kg/(m^2).  GENERAL: vitals reviewed and listed above, alert, oriented, appears well hydrated and in no acute distress  HEENT: atraumatic, conjunttiva clear, no obvious abnormalities on inspection of external nose and ears  NECK: no obvious masses on inspection  LUNGS: clear to auscultation bilaterally, no wheezes, rales or rhonchi, good air movement  CV: HRRR, no peripheral edema  MS: moves all extremities without noticeable abnormality  PSYCH:  pleasant and cooperative, no obvious depression or anxiety  ASSESSMENT AND PLAN:  Discussed the following assessment and plan:  Essential hypertension, benign  Hx of nonmelanoma skin cancer - followed by skin surgery center  Depression -We reviewed the PMH, PSH, FH, SH, Meds and Allergies. -We provided refills for any medications we will prescribe as needed. -We addressed current concerns per orders and patient instructions. -We have asked for records for pertinent exams, studies, vaccines and notes from previous providers. -We have advised patient to follow up per instructions below. -discussed risks with purity and contamination with supplements -follow up for medicare wellness exam   -Patient advised to return or notify a doctor immediately if symptoms worsen or persist or new concerns arise.  Patient Instructions  We recommend the following healthy lifestyle measures: - eat a healthy diet consisting of lots of vegetables, fruits, beans, nuts, seeds, healthy meats such as white chicken and fish and whole grains.  - avoid fried foods, fast food, processed foods, sodas, red meet and other fattening foods.  - get a least 150 minutes of aerobic exercise per week.   -follow up for medicare wellness exam     Lucretia Kern.

## 2013-03-07 ENCOUNTER — Telehealth: Payer: Self-pay | Admitting: Family Medicine

## 2013-03-07 NOTE — Telephone Encounter (Signed)
Relevant patient education assigned to patient using Emmi. ° °

## 2013-03-20 ENCOUNTER — Encounter: Payer: Self-pay | Admitting: Family Medicine

## 2013-03-25 ENCOUNTER — Encounter: Payer: Medicare HMO | Admitting: Family Medicine

## 2013-03-25 NOTE — Progress Notes (Signed)
error    This encounter was created in error - please disregard.

## 2013-04-17 ENCOUNTER — Encounter: Payer: Self-pay | Admitting: Family Medicine

## 2013-04-17 ENCOUNTER — Ambulatory Visit (INDEPENDENT_AMBULATORY_CARE_PROVIDER_SITE_OTHER): Payer: Medicare HMO | Admitting: Family Medicine

## 2013-04-17 ENCOUNTER — Ambulatory Visit: Payer: Medicare HMO

## 2013-04-17 VITALS — BP 130/78 | Temp 98.4°F | Wt 206.0 lb

## 2013-04-17 DIAGNOSIS — Z0000 Encounter for general adult medical examination without abnormal findings: Secondary | ICD-10-CM

## 2013-04-17 DIAGNOSIS — I10 Essential (primary) hypertension: Secondary | ICD-10-CM

## 2013-04-17 DIAGNOSIS — J309 Allergic rhinitis, unspecified: Secondary | ICD-10-CM

## 2013-04-17 DIAGNOSIS — F32A Depression, unspecified: Secondary | ICD-10-CM

## 2013-04-17 DIAGNOSIS — Z23 Encounter for immunization: Secondary | ICD-10-CM

## 2013-04-17 DIAGNOSIS — F3289 Other specified depressive episodes: Secondary | ICD-10-CM

## 2013-04-17 DIAGNOSIS — F329 Major depressive disorder, single episode, unspecified: Secondary | ICD-10-CM

## 2013-04-17 MED ORDER — FLUTICASONE PROPIONATE 50 MCG/ACT NA SUSP: 2.0000 | Freq: Every day | NASAL | Status: DC

## 2013-04-17 NOTE — Progress Notes (Addendum)
Medicare Annual Preventive Care Visit  (initial annual wellness or annual wellness exam)  PND: -worse in the spring -takes allegra -symptoms: PND, nasal congestion, drainage in throat -denies: fevers, SOB, wheezing, sinus pain, HA   1.) Patient-completed health risk assessment  - completed and reviewed, see scanned documentation  2.) Review of Medical History: -PMH, PSH, Family History and current specialty and care providers reviewed and updated and listed below  - see chart and below  3.) Review of functional ability and level of safety:  Any difficulty hearing? NO  History of falling? YES - seem scanned form  Any trouble with IADLs - using a phone, using transportation, grocery shopping, preparing meals, doing housework, doing laundry, taking medications and managing money? NO  Advance Directives? YES  See summary of recommendations in Patient Instructions below.  4.) Physical Exam Filed Vitals:   04/17/13 0832  BP: 130/78  Temp: 98.4 F (36.9 C)   Estimated body mass index is 32.26 kg/(m^2) as calculated from the following:   Height as of 03/05/13: 5\' 7"  (1.702 m).   Weight as of this encounter: 206 lb (93.441 kg).  HEENT: clear nasal congestion with PND, otherwise unremarkable  Mini Cog: 1. Patient instructed to listen carefully and repeat the following: Elmo  2. Clock drawing test was administered: NORMAL       3. Recall of three words 3/3  Scoring:  Number of words recalled?  Patient Score: NEG  POS  See patient instructions for recommendations.  4)The following written screening schedule of preventive measures were reviewed with assessment and plan made per below, orders and patient instructions:          Alcohol screening:neg     Obesity Screening and counseling:done     STI screening:declined     Tobacco Screening: neg       Pneumococcal (PPSV23 -one dose after 64, one before if risk factors), influenza yearly and hepatitis  B vaccines (if high risk - end stage renal disease, IV drugs, homosexual men, live in home for mentally retarded, hemophilia receiving factors) ASSESSMENT/PLAN: can't remember last pneumonia      Screening mammograph (yearly if >40) ASSESSMENT/PLAN: last mammogram 03/2013      Screening Pap smear/pelvic exam (q2 years) ASSESSMENT/PLAN: N/A       Colorectal cancer screening (FOBT yearly or flex sig q4y or colonoscopy q10y or barium enema q4y) ASSESSMENT/PLAN: done in 2014 and reports aged out      Bone mass measurements(covered q2y if indicated - estrogen def, osteoporosis, hyperparathyroid, vertebral abnormalities, osteoporosis or steroids) ASSESSMENT/PLAN: reports done in the past with osteopenia - she does not want to do treatment so declined, taking vit and and calcium      Screening for glaucoma(q1y if high risk - diabetes, FH, AA and > 50 or hispanic and > 65) ASSESSMENT/PLAN: sees eye doctor yearly      Cardiovascular screening blood tests (lipids q5y) ASSESSMENT/PLAN: done in 2014      Diabetes screening tests ASSESSMENT/PLAN: done in 2014   7.) Summary: -risk factors and conditions per above assessment were discussed and treatment, recommendations and referrals were offered per documentation above and orders and patient instructions.

## 2013-04-17 NOTE — Addendum Note (Signed)
Addended by: Colleen Can on: 04/17/2013 04:35 PM   Modules accepted: Orders

## 2013-04-17 NOTE — Patient Instructions (Addendum)
Please see a lawyer and/or go to this website to help you with advanced directives and designating a health care power of attorney so that your wishes will be followed should you become too ill to make your own medical decisions.  [email or web address]  Start the flonase 2 sprays daily for 1 month then 1 spray daily thereafter  Follow up yearly and as needed

## 2013-04-22 ENCOUNTER — Telehealth: Payer: Self-pay | Admitting: Family Medicine

## 2013-04-22 NOTE — Telephone Encounter (Signed)
Mychart updated

## 2013-04-22 NOTE — Telephone Encounter (Signed)
alisha please confirm that pt had flu shot last yr and an pneumonia vaccine this year. Pt my chart this request

## 2013-04-22 NOTE — Telephone Encounter (Signed)
Pt aware.

## 2013-05-13 ENCOUNTER — Telehealth: Payer: Self-pay | Admitting: Family Medicine

## 2013-05-13 ENCOUNTER — Encounter: Payer: Self-pay | Admitting: Family Medicine

## 2013-05-13 NOTE — Telephone Encounter (Signed)
Error/gd °

## 2013-05-20 ENCOUNTER — Telehealth: Payer: Self-pay | Admitting: Family Medicine

## 2013-05-20 NOTE — Telephone Encounter (Signed)
[facility] [place] is requesting re-fills on the following: Venlafaxine HCl (EFFEXOR PO) hydrochlorothiazide 25 MG tablet

## 2013-05-21 MED ORDER — HYDROCHLOROTHIAZIDE 25 MG PO TABS: 25.0000 mg | ORAL_TABLET | Freq: Every day | ORAL | Status: DC

## 2013-05-21 MED ORDER — VENLAFAXINE HCL 75 MG PO TABS: 75.0000 mg | ORAL_TABLET | Freq: Two times a day (BID) | ORAL | Status: DC

## 2013-05-21 NOTE — Telephone Encounter (Signed)
rx for effexor 75 mg bid qty 180 2 rf, and hydrochlorathiazide 25 mg qty 90 2 rf sent to [facility]

## 2013-06-18 ENCOUNTER — Other Ambulatory Visit: Payer: Self-pay | Admitting: Family Medicine

## 2013-06-18 DIAGNOSIS — Z853 Personal history of malignant neoplasm of breast: Secondary | ICD-10-CM

## 2013-07-23 ENCOUNTER — Ambulatory Visit
Admission: RE | Admit: 2013-07-23 | Discharge: 2013-07-23 | Disposition: A | Discharge status: Home / Self Care | Payer: Medicare HMO | Source: Ambulatory Visit | Attending: Family Medicine | Admitting: Family Medicine

## 2013-07-23 DIAGNOSIS — Z853 Personal history of malignant neoplasm of breast: Secondary | ICD-10-CM

## 2013-10-20 ENCOUNTER — Ambulatory Visit (INDEPENDENT_AMBULATORY_CARE_PROVIDER_SITE_OTHER): Payer: Medicare HMO | Admitting: *Deleted

## 2013-10-20 DIAGNOSIS — Z23 Encounter for immunization: Secondary | ICD-10-CM

## 2013-10-29 ENCOUNTER — Ambulatory Visit (INDEPENDENT_AMBULATORY_CARE_PROVIDER_SITE_OTHER): Payer: Medicare HMO | Admitting: Family Medicine

## 2013-10-29 ENCOUNTER — Encounter: Payer: Self-pay | Admitting: Family Medicine

## 2013-10-29 VITALS — BP 126/82 | HR 90 | Temp 97.8°F | Ht 67.0 in | Wt 198.5 lb

## 2013-10-29 DIAGNOSIS — F32A Depression, unspecified: Secondary | ICD-10-CM

## 2013-10-29 DIAGNOSIS — M545 Low back pain, unspecified: Secondary | ICD-10-CM

## 2013-10-29 DIAGNOSIS — F329 Major depressive disorder, single episode, unspecified: Secondary | ICD-10-CM

## 2013-10-29 DIAGNOSIS — F3289 Other specified depressive episodes: Secondary | ICD-10-CM

## 2013-10-29 MED ORDER — SERTRALINE HCL 50 MG PO TABS: 50.0000 mg | ORAL_TABLET | Freq: Every day | ORAL | Status: DC

## 2013-10-29 NOTE — Patient Instructions (Addendum)
-  call and get counseling set up  -start on a regular exercise program   -taper off of the Effexor: 75mg  daily for 4 days, then 1/2 dose daily for 4 days, then 1/2 dose every other day for 4 days, then stop  -in 2 weeks, after tapering off effexor - start the zoloft 50 mg daily  FOR BACK: Heat for 15 minutes twice daily Exercises provided at least 4 days per week Topical over the counter sports creams (with menthol or capsaicin), tylenol 500-1000mg  up to 3 times daily or naproxen twice daily as needed for pain  -follow up in 2 weeks

## 2013-10-29 NOTE — Progress Notes (Signed)
No chief complaint on file.   HPI:  Acute visit for:  1)Back Pain: -twisted wrong today and tweaked back -pain in R lower back -denies: weakness, numbness, radiation, fevers  2)Depression: -has struggled with this for a long time -son usually calls daily - but he is out of the country and  With the rainy weather the last 2 weeks she has been doing worse -reports she took lexapro in the past and it worked well -depressed mood daily Sleep disorder: yes, more sleep Interest deficit/anhedonia: doesn't have a lot of activities Guilt (worthlessness, hopelessness, regret): yes Energy deficit: yes Concentration deficit: no Appetite disorder: no Psychomotor retardation or agitation: yes, lethargic Suicidality: no  ROS: See pertinent positives and negatives per HPI.  Past Medical History  Diagnosis Date  . History of breast cancer 2008    DCIS/invasive ductal cancer right breast ER+/PR+  . Hypertension   . Anemia   . PUD (peptic ulcer disease) 8/94    Dr Magod-endoscopy  . Breast cyst 12/95    right breast  . Colon polyps   . Osteopenia   . Squamous cell carcinoma 6/11 7/11    on leg-removed  . Bleeding 1993    BTB on HRT-endo biopsy proe with focal breakdown  . Bleeding 1995    BTB on HRT-benign endo biopsy  . Piriformis muscle pain   . Plantar fasciitis   . Depression   . Ulcer   . Seasonal allergies   . UTI (urinary tract infection)   . Migraine     Past Surgical History  Procedure Laterality Date  . Dilation and curettage of uterus  50+ years ago    for retained placenta  . Breast lumpectomy  06/06/2006    right and sentinel node biopsy  . Dilation and curettage of uterus  1986    Family History  Problem Relation Age of Onset  . Heart attack Brother   . Diabetes Other     granddaughter  . Kidney failure Sister   . Arthritis Father   . Ovarian cancer Mother   . Hypertension Mother     History   Social History  . Marital Status: Widowed    Spouse  Name: N/A    Number of Children: N/A  . Years of Education: N/A   Social History Main Topics  . Smoking status: Former Smoker    Quit date: 06/27/1965  . Smokeless tobacco: Never Used  . Alcohol Use: No  . Drug Use: No  . Sexual Activity: No   Other Topics Concern  . 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 , Disp: , Rfl: ;  CALCIUM PO, Take 600 mg by mouth daily. , Disp: , Rfl: ;  Coenzyme Q10 (CO Q 10 PO), Take by mouth daily., Disp: , Rfl: ;  CRANBERRY PO, Take 84 mg by mouth daily. , Disp: , Rfl: ;  docusate sodium (COLACE) 100 MG capsule, Take 100 mg by mouth 2 (two) times daily., Disp: , Rfl:  Fexofenadine HCl (ALLEGRA PO), Take by mouth. 1/2 tablet daily, Disp: , Rfl: ;  fish oil-omega-3  fatty acids 1000 MG capsule, Take 2 g by mouth daily., Disp: , Rfl: ;  fluticasone (FLONASE) 50 MCG/ACT nasal spray, Place 2 sprays into both nostrils daily., Disp: 16 g, Rfl: 6;  Garlic 2263 MG CAPS, Take 1,000 mg by mouth daily., Disp: , Rfl:  hydrochlorothiazide (HYDRODIURIL) 25 MG tablet, Take 1 tablet (25 mg total) by mouth daily., Disp: 90 tablet, Rfl: 2;  Multiple Vitamin (MULTIVITAMIN) capsule, Take 1 capsule by mouth daily.  , Disp: , Rfl: ;  Probiotic Product (PROBIOTIC DAILY PO), Take by mouth daily., Disp: , Rfl: ;  venlafaxine (EFFEXOR) 75 MG tablet, Take 1 tablet (75 mg total) by mouth 2 (two) times daily., Disp: 180 tablet, Rfl: 2 VITAMIN D, CHOLECALCIFEROL, PO, Take 4,000 Int'l Units by mouth. , Disp: , Rfl: ;  sertraline (ZOLOFT) 50 MG tablet, Take 1 tablet (50 mg total) by mouth daily., Disp: 30 tablet, Rfl: 3  EXAM:  Filed Vitals:   10/29/13 1000  BP: 126/82  Pulse: 90  Temp: 97.8 F  (36.6 C)    Body mass index is 31.08 kg/(m^2).  GENERAL: vitals reviewed and listed above, alert, oriented, appears well hydrated and in no acute distress  HEENT: atraumatic, conjunttiva clear, no obvious abnormalities on inspection of external nose and ears  NECK: no obvious masses on inspection  LUNGS: clear to auscultation bilaterally, no wheezes, rales or rhonchi, good air movement  CV: HRRR, no peripheral edema  Normal Gait Normal inspection of back, no obvious scoliosis or leg length descrepancy No bony TTP Soft tissue TTP at: -/+ tests: neg trendelenburg,-facet loading, -SLRT, -CLRT, -FABER, -FADIR Normal muscle strength, sensation to light touch and DTRs in LEs bilaterally  MS: moves all extremities without noticeable abnormality  PSYCH: pleasant and cooperative, depressed mood  ASSESSMENT AND PLAN:  Discussed the following assessment and plan: >40 minutes spent face to face with this patient in cousnleing on conditions listed below, treatments, risks, exercise, activities, follow up  Depression - Plan: sertraline (ZOLOFT) 50 MG tablet -advised increased scheduled activities, gentle exercise, counseling  -she is afraid to try a counselor as is afraid wont like counselor -she wants to stop effexor and go back to zoloft which really helped in the past   Right-sided low back pain without sciatica -benign exam, likely muscle strain -tx per below  -Patient advised to return or notify a doctor immediately if symptoms worsen or persist or new concerns arise.  Patient Instructions  -call and get counseling set up  -start on a regular exercise program   -taper off of the Effexor: 75mg  daily for 4 days, then 1/2 dose daily for 4 days, then 1/2 dose every other day for 4 days, then stop  -in 2 weeks, after tapering off effexor - start the zoloft 50 mg daily  FOR BACK: Heat for 15 minutes twice daily Exercises provided at least 4 days per week Topical over the  counter sports creams (with menthol or capsaicin), tylenol 500-1000mg  up to 3 times daily or naproxen twice daily as needed for pain  -follow up in 2 weeks       Milyn Stapleton R.

## 2013-10-29 NOTE — Progress Notes (Signed)
Pre visit review using our clinic review tool, if applicable. No additional management support is needed unless otherwise documented below in the visit note. 

## 2013-11-12 ENCOUNTER — Ambulatory Visit: Payer: Medicare HMO | Admitting: Family Medicine

## 2013-11-12 ENCOUNTER — Encounter: Payer: Self-pay | Admitting: Family Medicine

## 2013-11-12 ENCOUNTER — Ambulatory Visit (INDEPENDENT_AMBULATORY_CARE_PROVIDER_SITE_OTHER): Payer: Medicare HMO | Admitting: Family Medicine

## 2013-11-12 VITALS — BP 124/80 | HR 88 | Temp 97.8°F | Ht 67.0 in | Wt 199.0 lb

## 2013-11-12 DIAGNOSIS — F32A Depression, unspecified: Secondary | ICD-10-CM

## 2013-11-12 DIAGNOSIS — F329 Major depressive disorder, single episode, unspecified: Secondary | ICD-10-CM

## 2013-11-12 DIAGNOSIS — M549 Dorsalgia, unspecified: Secondary | ICD-10-CM

## 2013-11-12 DIAGNOSIS — B353 Tinea pedis: Secondary | ICD-10-CM

## 2013-11-12 NOTE — Patient Instructions (Signed)
-  can hold off on starting zoloft if you wish - if you decide to start - take it every day, do not stop suddenly and follow up with me 1 month after starting  -for the feet use over the counter athlete foot cream twice daily for 1 month

## 2013-11-12 NOTE — Progress Notes (Unsigned)
No chief complaint on file.   HPI:  Follow up:  1)Back Pain:  -twisted wrong 10/30/23 and tweaked back and treated for R low back pain with HEP and conservative tx -reports: -denies: weakness, numbness, radiation, fevers   2)Depression:  -has struggled with this for a long time  -son usually calls daily - but he is out of the country  -10/29/13 she wanted to come off Effexor and do Zoloft which had worked well for her -reports:  Symptoms: -depressed mood daily  Sleep disorder: yes, more sleep  Interest deficit/anhedonia: doesn't have a lot of activities  Guilt (worthlessness, hopelessness, regret): yes  Energy deficit: yes  Concentration deficit: no  Appetite disorder: no  Psychomotor retardation or agitation: yes, lethargic  Suicidality: no  ROS: See pertinent positives and negatives per HPI.  Past Medical History  Diagnosis Date  . History of breast cancer 2008    DCIS/invasive ductal cancer right breast ER+/PR+  . Hypertension   . Anemia   . PUD (peptic ulcer disease) 8/94    Dr Magod-endoscopy  . Breast cyst 12/95    right breast  . Colon polyps   . Osteopenia   . Squamous cell carcinoma 6/11 7/11    on leg-removed  . Bleeding 1993    BTB on HRT-endo biopsy proe with focal breakdown  . Bleeding 1995    BTB on HRT-benign endo biopsy  . Piriformis muscle pain   . Plantar fasciitis   . Depression   . Ulcer   . Seasonal allergies   . UTI (urinary tract infection)   . Migraine     Past Surgical History  Procedure Laterality Date  . Dilation and curettage of uterus  50+ years ago    for retained placenta  . Breast lumpectomy  06/06/2006    right and sentinel node biopsy  . Dilation and curettage of uterus  1986    Family History  Problem Relation Age of Onset  . Heart attack Brother   . Diabetes Other     granddaughter  . Kidney failure Sister   . Arthritis Father   . Ovarian cancer Mother   . Hypertension Mother     History   Social History   . Marital Status: Widowed    Spouse Name: N/A    Number of Children: N/A  . Years of Education: N/A   Social History Main Topics  . Smoking status: Former Smoker    Quit date: 06/27/1965  . Smokeless tobacco: Never Used  . Alcohol Use: No  . Drug Use: No  . Sexual Activity: No   Other Topics Concern  . 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 , Disp: , Rfl: ;  CALCIUM PO, Take 600 mg by mouth daily. , Disp: , Rfl: ;  Coenzyme Q10 (CO Q 10 PO), Take by mouth daily., Disp: , Rfl: ;  CRANBERRY PO, Take 84 mg by mouth daily. , Disp: , Rfl: ;  docusate sodium (COLACE) 100 MG capsule, Take 100 mg by mouth 2 (two) times daily., Disp: , Rfl:  Fexofenadine HCl (  ALLEGRA PO), Take by mouth. 1/2 tablet daily, Disp: , Rfl: ;  fish oil-omega-3 fatty acids 1000 MG capsule, Take 2 g by mouth daily., Disp: , Rfl: ;  fluticasone (FLONASE) 50 MCG/ACT nasal spray, Place 2 sprays into both nostrils daily., Disp: 16 g, Rfl: 6;  Garlic 2035 MG CAPS, Take 1,000 mg by mouth daily., Disp: , Rfl:  hydrochlorothiazide (HYDRODIURIL) 25 MG tablet, Take 1 tablet (25 mg total) by mouth daily., Disp: 90 tablet, Rfl: 2;  Multiple Vitamin (MULTIVITAMIN) capsule, Take 1 capsule by mouth daily.  , Disp: , Rfl: ;  Probiotic Product (PROBIOTIC DAILY PO), Take by mouth daily., Disp: , Rfl: ;  sertraline (ZOLOFT) 50 MG tablet, Take 1 tablet (50 mg total) by mouth daily., Disp: 30 tablet, Rfl: 3 venlafaxine (EFFEXOR) 75 MG tablet, Take 1 tablet (75 mg total) by mouth 2 (two) times daily., Disp: 180 tablet, Rfl: 2;  VITAMIN D, CHOLECALCIFEROL, PO, Take 4,000 Int'l Units by mouth. , Disp: , Rfl:   EXAM:  There were no vitals filed for  this visit.  There is no weight on file to calculate BMI.  GENERAL: vitals reviewed and listed above, alert, oriented, appears well hydrated and in no acute distress  HEENT: atraumatic, conjunttiva clear, no obvious abnormalities on inspection of external nose and ears  NECK: no obvious masses on inspection  LUNGS: clear to auscultation bilaterally, no wheezes, rales or rhonchi, good air movement  CV: HRRR, no peripheral edema  MS: moves all extremities without noticeable abnormality  PSYCH: pleasant and cooperative, no obvious depression or anxiety  ASSESSMENT AND PLAN:  Discussed the following assessment and plan:  No diagnosis found.  -Patient advised to return or notify a doctor immediately if symptoms worsen or persist or new concerns arise.  There are no Patient Instructions on file for this visit.   Christine Benton R.

## 2013-11-12 NOTE — Progress Notes (Signed)
No chief complaint on file.   HPI:  Depression: -she has tapered off the effexor -wonders if needs to start the zoloft as the weather has improved, back has improved and she feels much better -denies: depressed mood, SI, thoughts of self harm -she may still start it as sometimes gets worse in the winter  Back Pain: -resolved  Itchy skin in toe creases for a few days, feels a little burning sensation   ROS: See pertinent positives and negatives per HPI.  Past Medical History  Diagnosis Date  . History of breast cancer 2008    DCIS/invasive ductal cancer right breast ER+/PR+  . Hypertension   . Anemia   . PUD (peptic ulcer disease) 8/94    Dr Magod-endoscopy  . Breast cyst 12/95    right breast  . Colon polyps   . Osteopenia   . Squamous cell carcinoma 6/11 7/11    on leg-removed  . Bleeding 1993    BTB on HRT-endo biopsy proe with focal breakdown  . Bleeding 1995    BTB on HRT-benign endo biopsy  . Piriformis muscle pain   . Plantar fasciitis   . Depression   . Ulcer   . Seasonal allergies   . UTI (urinary tract infection)   . Migraine     Past Surgical History  Procedure Laterality Date  . Dilation and curettage of uterus  50+ years ago    for retained placenta  . Breast lumpectomy  06/06/2006    right and sentinel node biopsy  . Dilation and curettage of uterus  1986    Family History  Problem Relation Age of Onset  . Heart attack Brother   . Diabetes Other     granddaughter  . Kidney failure Sister   . Arthritis Father   . Ovarian cancer Mother   . Hypertension Mother     History   Social History  . Marital Status: Widowed    Spouse Name: N/A    Number of Children: N/A  . Years of Education: N/A   Social History Main Topics  . Smoking status: Former Smoker    Quit date: 06/27/1965  . Smokeless tobacco: Never Used  . Alcohol Use: No  . Drug Use: No  . Sexual Activity: No   Other Topics Concern  . 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 , Disp: , Rfl: ;  CALCIUM PO, Take 600 mg by mouth daily. , Disp: , Rfl: ;  Coenzyme Q10 (CO Q 10 PO), Take by mouth daily., Disp: , Rfl: ;  CRANBERRY PO, Take 84 mg by mouth daily. , Disp: , Rfl: ;  docusate sodium (COLACE) 100 MG capsule, Take 100 mg by mouth 2 (two) times daily., Disp: , Rfl:  Fexofenadine HCl (ALLEGRA PO), Take by mouth. 1/2 tablet daily, Disp: , Rfl: ;  fish oil-omega-3 fatty acids 1000 MG capsule, Take 2 g by mouth daily., Disp: , Rfl: ;  fluticasone (FLONASE) 50 MCG/ACT nasal spray, Place 2 sprays into both nostrils daily., Disp: 16 g, Rfl: 6;  Garlic 4174 MG CAPS, Take 1,000 mg  by mouth daily., Disp: , Rfl:  hydrochlorothiazide (HYDRODIURIL) 25 MG tablet, Take 1 tablet (25 mg total) by mouth daily., Disp: 90 tablet, Rfl: 2;  Multiple Vitamin (MULTIVITAMIN) capsule, Take 1 capsule by mouth daily.  , Disp: , Rfl: ;  Probiotic Product (PROBIOTIC DAILY PO), Take by mouth daily., Disp: , Rfl: ;  sertraline (ZOLOFT) 50 MG tablet, Take 1 tablet (50 mg total) by mouth daily., Disp: 30 tablet, Rfl: 3 VITAMIN D, CHOLECALCIFEROL, PO, Take 4,000 Int'l Units by mouth. , Disp: , Rfl:   EXAM:  Filed Vitals:   11/12/13 0934  BP: 124/80  Pulse: 88  Temp: 97.8 F (36.6 C)    Body mass index is 31.16 kg/(m^2).  GENERAL: vitals reviewed and listed above, alert, oriented, appears well hydrated and in no acute distress  HEENT: atraumatic, conjunttiva clear, no obvious abnormalities on inspection of external nose and ears  NECK: no obvious masses on inspection  LUNGS: clear to auscultation bilaterally, no wheezes, rales or rhonchi, good air movement  CV: HRRR, no peripheral edema  MS:  moves all extremities without noticeable abnormality  PSYCH: pleasant and cooperative, no obvious depression or anxiety  ASSESSMENT AND PLAN:  Discussed the following assessment and plan:  Depression  Back pain, unspecified location  Tinea pedis, unspecified laterality  -exercise, counseling, fun activities +/- zoloft -back exercises to prevent reinjury -topical antifungal -follow up 1 month after starting, 3 months or if/when needed -Patient advised to return or notify a doctor immediately if symptoms worsen or persist or new concerns arise.  Patient Instructions  -can hold off on starting zoloft if you wish - if you decide to start - take it every day, do not stop suddenly and follow up with me 1 month after starting  -for the feet use over the counter athlete foot cream twice daily for 1 month     Audery Wassenaar R.

## 2013-11-12 NOTE — Progress Notes (Signed)
Pre visit review using our clinic review tool, if applicable. No additional management support is needed unless otherwise documented below in the visit note. 

## 2013-11-20 ENCOUNTER — Ambulatory Visit (INDEPENDENT_AMBULATORY_CARE_PROVIDER_SITE_OTHER): Payer: Medicare HMO | Admitting: Family Medicine

## 2013-11-20 ENCOUNTER — Encounter: Payer: Self-pay | Admitting: Family Medicine

## 2013-11-20 VITALS — BP 124/74 | HR 83 | Temp 97.3°F | Ht 67.0 in | Wt 200.0 lb

## 2013-11-20 DIAGNOSIS — N951 Menopausal and female climacteric states: Secondary | ICD-10-CM

## 2013-11-20 DIAGNOSIS — N760 Acute vaginitis: Secondary | ICD-10-CM

## 2013-11-20 DIAGNOSIS — F329 Major depressive disorder, single episode, unspecified: Secondary | ICD-10-CM

## 2013-11-20 DIAGNOSIS — F32A Depression, unspecified: Secondary | ICD-10-CM

## 2013-11-20 DIAGNOSIS — R232 Flushing: Secondary | ICD-10-CM

## 2013-11-20 MED ORDER — FLUCONAZOLE 150 MG PO TABS: 150.0000 mg | ORAL_TABLET | Freq: Once | ORAL | Status: DC

## 2013-11-20 NOTE — Progress Notes (Signed)
No chief complaint on file.   HPI:  Christine Maldonado scheduled an acute visit for:  Vaginitis: -has had this before -started a few days -volvovaginal pruritis -denies: discharge, pain, dysuria -she thinks is yeast, as has had before  Depression: -she had tapered off effexor because had been on it so many years and thought not likely needed for hot flashes and maybenot working for mood -but since taper off hot flashes recurred and depression not currently an issues -she has not started the zoloft -wants to restart effexor   ROS: See pertinent positives and negatives per HPI.  Past Medical History  Diagnosis Date  . History of breast cancer 2008    DCIS/invasive ductal cancer right breast ER+/PR+  . Hypertension   . Anemia   . PUD (peptic ulcer disease) 8/94    Dr Magod-endoscopy  . Breast cyst 12/95    right breast  . Colon polyps   . Osteopenia   . Squamous cell carcinoma 6/11 7/11    on leg-removed  . Bleeding 1993    BTB on HRT-endo biopsy proe with focal breakdown  . Bleeding 1995    BTB on HRT-benign endo biopsy  . Piriformis muscle pain   . Plantar fasciitis   . Depression   . Ulcer   . Seasonal allergies   . UTI (urinary tract infection)   . Migraine     Past Surgical History  Procedure Laterality Date  . Dilation and curettage of uterus  50+ years ago    for retained placenta  . Breast lumpectomy  06/06/2006    right and sentinel node biopsy  . Dilation and curettage of uterus  1986    Family History  Problem Relation Age of Onset  . Heart attack Brother   . Diabetes Other     granddaughter  . Kidney failure Sister   . Arthritis Father   . Ovarian cancer Mother   . Hypertension Mother     History   Social History  . Marital Status: Widowed    Spouse Name: N/A    Number of Children: N/A  . Years of Education: N/A   Social History Main Topics  . Smoking status: Former Smoker    Quit date: 06/27/1965  . Smokeless tobacco: Never Used   . Alcohol Use: No  . Drug Use: No  . Sexual Activity: No   Other Topics Concern  . 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 , Disp: , Rfl: ;  CALCIUM PO, Take 600 mg by mouth daily. , Disp: , Rfl: ;  Coenzyme Q10 (CO Q 10 PO), Take by mouth daily., Disp: , Rfl: ;  CRANBERRY PO, Take 84 mg by mouth daily. , Disp: , Rfl: ;  docusate sodium (COLACE) 100 MG capsule, Take 100 mg by mouth 2 (two) times daily., Disp: , Rfl:  Fexofenadine HCl (ALLEGRA PO), Take by mouth. 1/2 tablet daily, Disp: , Rfl: ;  fish oil-omega-3 fatty acids 1000 MG capsule, Take 2 g by mouth daily., Disp: , Rfl: ;  fluticasone (FLONASE) 50 MCG/ACT nasal spray, Place 2  sprays into both nostrils daily., Disp: 16 g, Rfl: 6;  Garlic 6433 MG CAPS, Take 1,000 mg by mouth daily., Disp: , Rfl:  hydrochlorothiazide (HYDRODIURIL) 25 MG tablet, Take 1 tablet (25 mg total) by mouth daily., Disp: 90 tablet, Rfl: 2;  Multiple Vitamin (MULTIVITAMIN) capsule, Take 1 capsule by mouth daily.  , Disp: , Rfl: ;  Probiotic Product (PROBIOTIC DAILY PO), Take by mouth daily., Disp: , Rfl: ;  VITAMIN D, CHOLECALCIFEROL, PO, Take 4,000 Int'l Units by mouth. , Disp: , Rfl:  fluconazole (DIFLUCAN) 150 MG tablet, Take 1 tablet (150 mg total) by mouth once., Disp: 1 tablet, Rfl: 1  EXAM:  Filed Vitals:   11/20/13 1354  BP: 124/74  Pulse: 83  Temp: 97.3 F (36.3 C)    Body mass index is 31.32 kg/(m^2).  GENERAL: vitals reviewed and listed above, alert, oriented, appears well hydrated and in no acute distress  HEENT: atraumatic, conjunttiva clear, no obvious abnormalities on inspection of external nose and ears  NECK: no obvious masses on  inspection  LUNGS: clear to auscultation bilaterally, no wheezes, rales or rhonchi, good air movement  CV: HRRR, no peripheral edema  GU: mild volvovag erythema/irritation  MS: moves all extremities without noticeable abnormality  PSYCH: pleasant and cooperative, no obvious depression or anxiety  ASSESSMENT AND PLAN:  Discussed the following assessment and plan:  Vaginitis and vulvovaginitis - Plan: fluconazole (DIFLUCAN) 150 MG tablet -follow up if persists  Depression Hot flashes - back to taper up on Effexor per her desires  -Patient advised to return or notify a doctor immediately if symptoms worsen or persist or new concerns arise.  Patient Instructions  effexor 75mg  daily  Diflucan once today then can repeat in 1 week  Follow up in 1 month     KIM, HANNAH R.

## 2013-11-20 NOTE — Patient Instructions (Signed)
effexor 75mg  daily  Diflucan once today then can repeat in 1 week  Follow up in 1 month

## 2013-11-20 NOTE — Progress Notes (Signed)
Pre visit review using our clinic review tool, if applicable. No additional management support is needed unless otherwise documented below in the visit note. 

## 2013-11-21 ENCOUNTER — Encounter: Payer: Self-pay | Admitting: Family Medicine

## 2013-11-21 ENCOUNTER — Ambulatory Visit (INDEPENDENT_AMBULATORY_CARE_PROVIDER_SITE_OTHER): Payer: Medicare HMO | Admitting: Family Medicine

## 2013-11-21 ENCOUNTER — Telehealth: Payer: Self-pay | Admitting: Obstetrics & Gynecology

## 2013-11-21 ENCOUNTER — Telehealth: Payer: Self-pay | Admitting: Family Medicine

## 2013-11-21 VITALS — BP 120/70 | HR 90 | Temp 97.5°F | Ht 67.0 in

## 2013-11-21 DIAGNOSIS — T50905A Adverse effect of unspecified drugs, medicaments and biological substances, initial encounter: Secondary | ICD-10-CM

## 2013-11-21 DIAGNOSIS — T887XXA Unspecified adverse effect of drug or medicament, initial encounter: Secondary | ICD-10-CM

## 2013-11-21 NOTE — Telephone Encounter (Signed)
Patient informed and per Dr Maudie Mercury the pt can come in today at 3:15pm.

## 2013-11-21 NOTE — Progress Notes (Signed)
No chief complaint on file.   HPI:  Drug Allergy: -broke out with hives on arms and legs after taking diflucan -reports now she remembers this happened one other time when this happened -denies: fevers, malaise, SOB, swelling of mouth, toungue or face, nausea, vomiting  ROS: See pertinent positives and negatives per HPI.  Past Medical History  Diagnosis Date  . History of breast cancer 2008    DCIS/invasive ductal cancer right breast ER+/PR+  . Hypertension   . Anemia   . PUD (peptic ulcer disease) 8/94    Dr Magod-endoscopy  . Breast cyst 12/95    right breast  . Colon polyps   . Osteopenia   . Squamous cell carcinoma 6/11 7/11    on leg-removed  . Bleeding 1993    BTB on HRT-endo biopsy proe with focal breakdown  . Bleeding 1995    BTB on HRT-benign endo biopsy  . Piriformis muscle pain   . Plantar fasciitis   . Depression   . Ulcer   . Seasonal allergies   . UTI (urinary tract infection)   . Migraine     Past Surgical History  Procedure Laterality Date  . Dilation and curettage of uterus  50+ years ago    for retained placenta  . Breast lumpectomy  06/06/2006    right and sentinel node biopsy  . Dilation and curettage of uterus  1986    Family History  Problem Relation Age of Onset  . Heart attack Brother   . Diabetes Other     granddaughter  . Kidney failure Sister   . Arthritis Father   . Ovarian cancer Mother   . Hypertension Mother     History   Social History  . Marital Status: Widowed    Spouse Name: N/A    Number of Children: N/A  . Years of Education: N/A   Social History Main Topics  . Smoking status: Former Smoker    Quit date: 06/27/1965  . Smokeless tobacco: Never Used  . Alcohol Use: No  . Drug Use: No  . Sexual Activity: No   Other Topics Concern  . 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 , Disp: , Rfl: ;  CALCIUM PO, Take 600 mg by mouth daily. , Disp: , Rfl: ;  Coenzyme Q10 (CO Q 10 PO), Take by mouth daily., Disp: , Rfl: ;  CRANBERRY PO, Take 84 mg by mouth daily. , Disp: , Rfl: ;  docusate sodium (COLACE) 100 MG capsule, Take 100 mg by mouth 2 (two) times daily., Disp: , Rfl:  Fexofenadine HCl (ALLEGRA PO), Take by mouth. 1/2 tablet daily, Disp: , Rfl: ;  fish oil-omega-3 fatty acids 1000 MG capsule, Take 2 g by mouth daily., Disp: , Rfl: ;  fluticasone (FLONASE) 50 MCG/ACT nasal spray, Place 2 sprays into both nostrils daily., Disp: 16 g, Rfl: 6;  Garlic 1610 MG CAPS, Take 1,000 mg by mouth daily., Disp: , Rfl:  hydrochlorothiazide (HYDRODIURIL) 25 MG tablet, Take 1 tablet (25 mg total) by mouth daily., Disp: 90 tablet, Rfl: 2;  Multiple Vitamin (  MULTIVITAMIN) capsule, Take 1 capsule by mouth daily.  , Disp: , Rfl: ;  Probiotic Product (PROBIOTIC DAILY PO), Take by mouth daily., Disp: , Rfl: ;  VITAMIN D, CHOLECALCIFEROL, PO, Take 4,000 Int'l Units by mouth. , Disp: , Rfl:   EXAM:  Filed Vitals:   11/21/13 1524  BP: 120/70  Pulse: 90  Temp: 97.5 F (36.4 C)    Body mass index is 0.00 kg/(m^2).  GENERAL: vitals reviewed and listed above, alert, oriented, appears well hydrated and in no acute distress  HEENT: atraumatic, conjunttiva clear, no obvious abnormalities on inspection of external nose and ears  NECK: no obvious masses on inspection  LUNGS: clear to auscultation bilaterally, no wheezes, rales or rhonchi, good air movement  CV: HRRR, no peripheral edema  SKIN: hives on legs and a few on arms  MS: moves all extremities without noticeable abnormality  PSYCH: pleasant and cooperative, no obvious depression or anxiety  ASSESSMENT AND PLAN:  Discussed the following assessment and plan:  Drug  reaction, initial encounter  -discussed treatment options - she does not want to take prednisone, opted for antihistamine and close observation -warned of signs of severe allergic reaction and advised emergency precautions  and of Saturday clinic if any mild concerns -Patient advised to return or notify a doctor immediately if symptoms worsen or persist or new concerns arise.  Patient Instructions  pepcid or zyrtec daily  Seek emergency care immediately if trouble breathing, swelling of face, neck or mouth, nausea and vomiting, getting worse   Drug Allergy Allergic reactions to medicines are common. Some allergic reactions are mild. A delayed type of drug allergy that occurs 1 week or more after exposure to a medicine or vaccine is called serum sickness. A life-threatening, sudden (acute) allergic reaction that involves the whole body is called anaphylaxis. CAUSES  "True" drug allergies occur when there is an allergic reaction to a medicine. This is caused by overactivity of the immune system. First, the body becomes sensitized. The immune system is triggered by your first exposure to the medicine. Following this first exposure, future exposure to the same medicine may be life-threatening. Almost any medicine can cause an allergic reaction. Common ones are:  Penicillin.  Sulfonamides (sulfa drugs).  Local anesthetics.  X-ray dyes that contain iodine. SYMPTOMS  Common symptoms of a minor allergic reaction are:  Swelling around the mouth.  An itchy red rash or hives.  Vomiting or diarrhea. Anaphylaxis can cause swelling of the mouth and throat. This makes it difficult to breathe and swallow. Severe reactions can be fatal within seconds, even after exposure to only a trace amount of the drug that causes the reaction. HOME CARE INSTRUCTIONS   If you are unsure of what caused your reaction, keep a diary of foods and medicines used. Include the symptoms that followed. Avoid anything that  causes reactions.  You may want to follow up with an allergy specialist after the reaction has cleared in order to be tested to confirm the allergy. It is important to confirm that your reaction is an allergy, not just a side effect to the medicine. If you have a true allergy to a medicine, this may prevent that medicine and related medicines from being given to you when you are very ill.  If you have hives or a rash:  Take medicines as directed by your caregiver.  You may use an over-the-counter antihistamine (diphenhydramine) as needed.  Apply cold compresses to the skin or take baths in cool  water. Avoid hot baths or showers.  If you are severely allergic:  Continuous observation after a severe reaction may be needed. Hospitalization is often required.  Wear a medical alert bracelet or necklace stating your allergy.  You and your family must learn how to use an anaphylaxis kit or give an epinephrine injection to temporarily treat an emergency allergic reaction. If you have had a severe reaction, always carry your epinephrine injection or anaphylaxis kit with you. This can be lifesaving if you have a severe reaction.  Do not drive or perform tasks after treatment until the medicines used to treat your reaction have worn off, or until your caregiver says it is okay. SEEK MEDICAL CARE IF:   You think you had an allergic reaction. Symptoms usually start within 30 minutes after exposure.  Symptoms are getting worse rather than better.  You develop new symptoms.  The symptoms that brought you to your caregiver return. SEEK IMMEDIATE MEDICAL CARE IF:   You have swelling of the mouth, difficulty breathing, or wheezing.  You have a tight feeling in your chest or throat.  You develop hives, swelling, or itching all over your body.  You develop severe vomiting or diarrhea.  You feel faint or pass out. This is an emergency. Use your epinephrine injection or anaphylaxis kit as you have  been instructed. Call for emergency medical help. Even if you improve after the injection, you need to be examined at a hospital emergency department. MAKE SURE YOU:   Understand these instructions.  Will watch your condition.  Will get help right away if you are not doing well or get worse. Document Released: 01/16/2005 Document Revised: 04/10/2011 Document Reviewed: 06/22/2010 [facility] Patient Information 2015 [place], [place]. This information is not intended to replace advice given to you by your health care provider. Make sure you discuss any questions you have with your health care provider.      Colin Benton R.

## 2013-11-21 NOTE — Telephone Encounter (Signed)
If this is new and significant - needs appt here of urgent care. Would not ever take that medication again.

## 2013-11-21 NOTE — Patient Instructions (Addendum)
pepcid or zyrtec daily  Seek emergency care and call 911 if any signs of severe reaction - immediately if trouble breathing, swelling of face, neck or mouth, nausea and vomiting, getting worse   Drug Allergy Allergic reactions to medicines are common. Some allergic reactions are mild. A delayed type of drug allergy that occurs 1 week or more after exposure to a medicine or vaccine is called serum sickness. A life-threatening, sudden (acute) allergic reaction that involves the whole body is called anaphylaxis. CAUSES  "True" drug allergies occur when there is an allergic reaction to a medicine. This is caused by overactivity of the immune system. First, the body becomes sensitized. The immune system is triggered by your first exposure to the medicine. Following this first exposure, future exposure to the same medicine may be life-threatening. Almost any medicine can cause an allergic reaction. Common ones are:  Penicillin.  Sulfonamides (sulfa drugs).  Local anesthetics.  X-ray dyes that contain iodine. SYMPTOMS  Common symptoms of a minor allergic reaction are:  Swelling around the mouth.  An itchy red rash or hives.  Vomiting or diarrhea. Anaphylaxis can cause swelling of the mouth and throat. This makes it difficult to breathe and swallow. Severe reactions can be fatal within seconds, even after exposure to only a trace amount of the drug that causes the reaction. HOME CARE INSTRUCTIONS   If you are unsure of what caused your reaction, keep a diary of foods and medicines used. Include the symptoms that followed. Avoid anything that causes reactions.  You may want to follow up with an allergy specialist after the reaction has cleared in order to be tested to confirm the allergy. It is important to confirm that your reaction is an allergy, not just a side effect to the medicine. If you have a true allergy to a medicine, this may prevent that medicine and related medicines from being  given to you when you are very ill.  If you have hives or a rash:  Take medicines as directed by your caregiver.  You may use an over-the-counter antihistamine (diphenhydramine) as needed.  Apply cold compresses to the skin or take baths in cool water. Avoid hot baths or showers.  If you are severely allergic:  Continuous observation after a severe reaction may be needed. Hospitalization is often required.  Wear a medical alert bracelet or necklace stating your allergy.  You and your family must learn how to use an anaphylaxis kit or give an epinephrine injection to temporarily treat an emergency allergic reaction. If you have had a severe reaction, always carry your epinephrine injection or anaphylaxis kit with you. This can be lifesaving if you have a severe reaction.  Do not drive or perform tasks after treatment until the medicines used to treat your reaction have worn off, or until your caregiver says it is okay. SEEK MEDICAL CARE IF:   You think you had an allergic reaction. Symptoms usually start within 30 minutes after exposure.  Symptoms are getting worse rather than better.  You develop new symptoms.  The symptoms that brought you to your caregiver return. SEEK IMMEDIATE MEDICAL CARE IF:   You have swelling of the mouth, difficulty breathing, or wheezing.  You have a tight feeling in your chest or throat.  You develop hives, swelling, or itching all over your body.  You develop severe vomiting or diarrhea.  You feel faint or pass out. This is an emergency. Use your epinephrine injection or anaphylaxis kit as you have  been instructed. Call for emergency medical help. Even if you improve after the injection, you need to be examined at a hospital emergency department. MAKE SURE YOU:   Understand these instructions.  Will watch your condition.  Will get help right away if you are not doing well or get worse. Document Released: 01/16/2005 Document Revised:  04/10/2011 Document Reviewed: 06/22/2010 [facility] Patient Information 2015 [place], [place]. This information is not intended to replace advice given to you by your health care provider. Make sure you discuss any questions you have with your health care provider.

## 2013-11-21 NOTE — Telephone Encounter (Signed)
Left message appointment has been canceled and needs to reschedule.

## 2013-11-21 NOTE — Telephone Encounter (Signed)
Pt called to say that she took fluconazole (DIFLUCAN) 150 MG tablet and this morning when she woke up she has red spots all over her legs and arms. She wanted Dr Maudie Mercury to know about this

## 2013-11-21 NOTE — Telephone Encounter (Signed)
No answer at the pts home number. 

## 2013-11-21 NOTE — Progress Notes (Signed)
Pre visit review using our clinic review tool, if applicable. No additional management support is needed unless otherwise documented below in the visit note. 

## 2013-11-24 NOTE — Telephone Encounter (Signed)
Patient left a message on answering machine over the weekend, aware appointment has been canceled. Patient will be out of town until 11/30/13 and will call to reschedule when she returns.

## 2013-12-01 ENCOUNTER — Encounter: Payer: Self-pay | Admitting: Family Medicine

## 2013-12-01 ENCOUNTER — Ambulatory Visit: Payer: Medicare Other | Admitting: Obstetrics & Gynecology

## 2013-12-22 ENCOUNTER — Encounter: Payer: Self-pay | Admitting: Nurse Practitioner

## 2013-12-22 ENCOUNTER — Ambulatory Visit (INDEPENDENT_AMBULATORY_CARE_PROVIDER_SITE_OTHER): Payer: Medicare HMO | Admitting: Nurse Practitioner

## 2013-12-22 VITALS — BP 132/80 | HR 64 | Ht 67.0 in | Wt 198.0 lb

## 2013-12-22 DIAGNOSIS — Z01419 Encounter for gynecological examination (general) (routine) without abnormal findings: Secondary | ICD-10-CM

## 2013-12-22 NOTE — Progress Notes (Signed)
Patient ID: Christine Maldonado, female   DOB: May 26, 1928, 80 y.o.   MRN: [id number] 80 y.o. G2P2 Widowed Caucasian Fe here for annual exam.  No new health problems.  She did have a yeast infection in august and took Diflucan X 1 dose and broke out with a rash lower extremities.  Patient's last menstrual period was 01/30/1993.          Sexually active: No.  The current method of family planning is abstinence.    Exercising: No.  The patient does not participate in regular exercise at present.  Pt is active daily.. Smoker:  no  Health Maintenance: Pap:  11/11/12, negative  MMG:  07/23/13, Bi-Rads 2:  Benign findings Colonoscopy:  2014, Dr. Watt Climes, no repeat BMD:   2006, Dr. Minna Antis (0.3/-0.7) TDaP:  2013  Labs: PCP     reports that she quit smoking about 48 years ago. She has never used smokeless tobacco. She reports that she does not drink alcohol or use illicit drugs.  Past Medical History  Diagnosis Date  . History of breast cancer 2008    DCIS/invasive ductal cancer right breast ER+/PR+  . Hypertension   . Anemia   . PUD (peptic ulcer disease) 8/94    Dr Magod-endoscopy  . Breast cyst 12/95    right breast  . Colon polyps   . Osteopenia   . Squamous cell carcinoma 6/11 7/11    on leg-removed  . Bleeding 1993    BTB on HRT-endo biopsy proe with focal breakdown  . Bleeding 1995    BTB on HRT-benign endo biopsy  . Piriformis muscle pain   . Plantar fasciitis   . Depression   . Ulcer   . Seasonal allergies   . UTI (urinary tract infection)   . Migraine     Past Surgical History  Procedure Laterality Date  . Dilation and curettage of uterus  50+ years ago    for retained placenta  . Breast lumpectomy  06/06/2006    right and sentinel node biopsy  . Dilation and curettage of uterus  1986    Current Outpatient Prescriptions  Medication Sig Dispense Refill  . Ascorbic Acid (VITAMIN C PO) Take 1,000 mg by mouth daily.     Marland Kitchen CALCIUM PO Take 600 mg by mouth daily.     .  Coenzyme Q10 (CO Q 10 PO) Take by mouth daily.    Marland Kitchen CRANBERRY PO Take 84 mg by mouth daily.     Marland Kitchen docusate sodium (COLACE) 100 MG capsule Take 100 mg by mouth 2 (two) times daily.    Marland Kitchen Fexofenadine HCl (ALLEGRA PO) Take 0.5 tablets by mouth 2 (two) times daily.     . fish oil-omega-3 fatty acids 1000 MG capsule Take 2 g by mouth daily.    . fluticasone (FLONASE) 50 MCG/ACT nasal spray Place 2 sprays into both nostrils daily. (Patient taking differently: Place 2 sprays into both nostrils daily as needed. ) 16 g 6  . Garlic 9163 MG CAPS Take 1,000 mg by mouth daily.    . hydrochlorothiazide (HYDRODIURIL) 25 MG tablet Take 1 tablet (25 mg total) by mouth daily. 90 tablet 2  . Multiple Vitamin (MULTIVITAMIN) capsule Take 1 capsule by mouth daily.      . Probiotic Product (PROBIOTIC DAILY PO) Take by mouth daily.    Marland Kitchen venlafaxine (EFFEXOR) 75 MG tablet Take 37.5 mg by mouth 2 (two) times daily.    Marland Kitchen VITAMIN D, CHOLECALCIFEROL, PO Take 4,000  Int'l Units by mouth.      No current facility-administered medications for this visit.    Family History  Problem Relation Age of Onset  . Heart attack Brother   . Diabetes Other     granddaughter  . Kidney failure Sister   . Arthritis Father   . Ovarian cancer Mother   . Hypertension Mother     ROS:  Pertinent items are noted in HPI.  Otherwise, a comprehensive ROS was negative.  Exam:   BP 132/80 mmHg  Pulse 64  Ht 5\' 7"  (1.702 m)  Wt 198 lb (89.812 kg)  BMI 31.00 kg/m2  LMP 01/30/1993 Height: 5\' 7"  (170.2 cm)  Ht Readings from Last 3 Encounters:  12/22/13 5\' 7"  (1.702 m)  11/21/13 5\' 7"  (1.702 m)  11/20/13 5\' 7"  (1.702 m)    General appearance: alert, cooperative and appears stated age Head: Normocephalic, without obvious abnormality, atraumatic Neck: no adenopathy, supple, symmetrical, trachea midline and thyroid normal to inspection and palpation Lungs: clear to auscultation bilaterally Breasts: normal appearance, no masses or  tenderness, surgical changes on the right Heart: regular rate and rhythm Abdomen: soft, non-tender; no masses,  no organomegaly Extremities: extremities normal, atraumatic, no cyanosis or edema Skin: Skin color, texture, turgor normal. No rashes or lesions Lymph nodes: Cervical, supraclavicular, and axillary nodes normal. No abnormal inguinal nodes palpated Neurologic: Grossly normal   Pelvic: External genitalia:  no lesions              Urethra:  normal appearing urethra with no masses, tenderness or lesions              Bartholin's and Skene's: normal                 Vagina: normal appearing vagina with normal color and discharge, no lesions              Cervix: anteverted              Pap taken: No. Bimanual Exam:  Uterus:  normal size, contour, position, consistency, mobility, non-tender              Adnexa: no mass, fullness, tenderness               Rectovaginal: Confirms               Anus:  normal sphincter tone, no lesions  A:  Well Woman with normal exam  Postmenopausal took  HRT 7/93- 05/2006  H/O Right breast cancer 2008 ER+,PR+, Her 2 neu neg.   S/P lumpectomy and neg sentinel node biopsy 05/2006  Squamous cell carcinoma, sees derm every six months  Osteopenia stable   P:   Reviewed health and wellness pertinent to exam  Pap smear not taken today  Mammogram is due 6/16  Counseled on breast self exam, mammography screening, adequate intake of calcium and vitamin D, diet and exercise return annually or prn  An After Visit Summary was printed and given to the patient.

## 2013-12-22 NOTE — Patient Instructions (Signed)

## 2013-12-24 NOTE — Progress Notes (Signed)
Reviewed personally.  M. Suzanne Kire Ferg, MD.  

## 2014-01-05 ENCOUNTER — Telehealth: Payer: Self-pay | Admitting: Family Medicine

## 2014-01-05 MED ORDER — HYDROCHLOROTHIAZIDE 25 MG PO TABS: 25.0000 mg | ORAL_TABLET | Freq: Every day | ORAL | Status: DC

## 2014-01-05 NOTE — Telephone Encounter (Signed)
ok 

## 2014-01-05 NOTE — Telephone Encounter (Signed)
Pt needs hctz 25 mg #90 w/refills sent to [facility]

## 2014-01-05 NOTE — Telephone Encounter (Signed)
Rx done. 

## 2014-01-30 HISTORY — PX: KNEE ARTHROSCOPY: SUR90

## 2014-02-09 ENCOUNTER — Ambulatory Visit (INDEPENDENT_AMBULATORY_CARE_PROVIDER_SITE_OTHER): Payer: Medicare HMO | Admitting: Family Medicine

## 2014-02-09 ENCOUNTER — Encounter: Payer: Self-pay | Admitting: Family Medicine

## 2014-02-09 VITALS — BP 120/74 | HR 89 | Temp 97.9°F | Ht 67.0 in | Wt 200.6 lb

## 2014-02-09 DIAGNOSIS — R232 Flushing: Secondary | ICD-10-CM

## 2014-02-09 DIAGNOSIS — J309 Allergic rhinitis, unspecified: Secondary | ICD-10-CM

## 2014-02-09 DIAGNOSIS — E669 Obesity, unspecified: Secondary | ICD-10-CM

## 2014-02-09 DIAGNOSIS — R739 Hyperglycemia, unspecified: Secondary | ICD-10-CM

## 2014-02-09 DIAGNOSIS — R300 Dysuria: Secondary | ICD-10-CM

## 2014-02-09 DIAGNOSIS — F329 Major depressive disorder, single episode, unspecified: Secondary | ICD-10-CM

## 2014-02-09 DIAGNOSIS — M25562 Pain in left knee: Secondary | ICD-10-CM

## 2014-02-09 DIAGNOSIS — I10 Essential (primary) hypertension: Secondary | ICD-10-CM

## 2014-02-09 DIAGNOSIS — F32A Depression, unspecified: Secondary | ICD-10-CM

## 2014-02-09 DIAGNOSIS — N951 Menopausal and female climacteric states: Secondary | ICD-10-CM

## 2014-02-09 HISTORY — DX: Flushing: R23.2

## 2014-02-09 HISTORY — DX: Hyperglycemia, unspecified: R73.9

## 2014-02-09 LAB — POCT URINALYSIS DIPSTICK
BILIRUBIN UA: NEGATIVE
GLUCOSE UA: NEGATIVE
Ketones, UA: NEGATIVE
NITRITE UA: NEGATIVE
Protein, UA: NEGATIVE
Spec Grav, UA: 1.015
UROBILINOGEN UA: 0.2
pH, UA: 6

## 2014-02-09 NOTE — Addendum Note (Signed)
Addended by: Agnes Lawrence on: 02/09/2014 01:46 PM   Modules accepted: Orders

## 2014-02-09 NOTE — Progress Notes (Signed)
Pre visit review using our clinic review tool, if applicable. No additional management support is needed unless otherwise documented below in the visit note. 

## 2014-02-09 NOTE — Progress Notes (Signed)
HPI:  Follow up:  Depression/hot flashes: -she had tapered off effexor in 2015 as thought not needed then with worsening hot flashes -sees gyn for women's health and breast healthy -reports:  Doing ok -denies: SI worsening depression  HTN/Prediabetes: -on hctz 25 mg daily  -denies: CP, SOB, swelling, palpitations -[facility] - reviewed  AR: -meds: flonase and allegra prn -reports: stable  Dysuria: -mild starting a few weeks ago -cloudy urine occ, mild dysuria, mild vulvovag infection -denies: hematuria, rash, nausea, vomiting, fevers  L knee Pain: -reports had acute pain after twisting a few years ago -reports saw orthopedic doctor (Dr. Rhona Raider at [facility]) then and injection done -reports mild discomfort sometimes and swelling after a lot of walking/steps on recent travels -denies: weakness, clicking popping   ROS: See pertinent positives and negatives per HPI.  Past Medical History  Diagnosis Date  . History of breast cancer 2008    DCIS/invasive ductal cancer right breast ER+/PR+  . Hypertension   . Anemia   . PUD (peptic ulcer disease) 8/94    Dr Magod-endoscopy  . Breast cyst 12/95    right breast  . Colon polyps   . Osteopenia   . Squamous cell carcinoma 6/11 7/11    on leg-removed  . Bleeding 1993    BTB on HRT-endo biopsy proe with focal breakdown  . Bleeding 1995    BTB on HRT-benign endo biopsy  . Piriformis muscle pain   . Plantar fasciitis   . Depression   . Ulcer   . Seasonal allergies   . UTI (urinary tract infection)   . Migraine   . Hot flashes 02/09/2014  . Hyperglycemia 02/09/2014    Pred[facility] labs 03/2013 at [facility]     Past Surgical History  Procedure Laterality Date  . Dilation and curettage of uterus  50+ years ago    for retained placenta  . Breast lumpectomy  06/06/2006    right and sentinel node biopsy  . Dilation and curettage of uterus  1986    Family History  Problem Relation Age of Onset  . Heart  attack Brother   . Diabetes Other     granddaughter  . Kidney failure Sister   . Arthritis Father   . Ovarian cancer Mother   . Hypertension Mother     History   Social History  . Marital Status: Widowed    Spouse Name: N/A    Number of Children: N/A  . Years of Education: N/A   Social History Main Topics  . Smoking status: Former Smoker    Quit date: 06/27/1965  . Smokeless tobacco: Never Used  . Alcohol Use: No  . Drug Use: No  . Sexual Activity: No   Other Topics Concern  . 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 , Disp: , Rfl: ;  CALCIUM PO, Take 600 mg by mouth daily. , Disp: , Rfl: ;  Coenzyme Q10 (CO Q 10 PO), Take by mouth daily., Disp: , Rfl: ;  CRANBERRY PO, Take 84 mg by mouth daily. , Disp: , Rfl: ;  docusate sodium (COLACE) 100 MG capsule, Take 100 mg by mouth 2 (two) times daily., Disp: , Rfl:  Fexofenadine HCl (ALLEGRA PO), Take 0.5 tablets by mouth 2 (two) times daily. , Disp: , Rfl: ;  fish oil-omega-3 fatty acids 1000 MG capsule, Take 2 g by mouth daily., Disp: , Rfl: ;  Garlic 4782 MG CAPS, Take 1,000 mg by mouth daily., Disp: , Rfl: ;  hydrochlorothiazide (HYDRODIURIL) 25 MG tablet, Take 1 tablet (25 mg total) by mouth daily., Disp: 90 tablet, Rfl: 0 Multiple Vitamin (MULTIVITAMIN) capsule, Take 1 capsule by mouth daily.  , Disp: , Rfl: ;  Probiotic Product (PROBIOTIC DAILY PO), Take by mouth daily., Disp: , Rfl: ;  venlafaxine (EFFEXOR) 75 MG tablet, Take 37.5 mg by mouth 2 (two) times daily., Disp: , Rfl: ;  VITAMIN D, CHOLECALCIFEROL, PO, Take 4,000 Int'l Units by mouth. , Disp: , Rfl:   EXAM:  Filed Vitals:   02/09/14 1259  BP: 120/74  Pulse: 89  Temp: 97.9 F  (36.6 C)    Body mass index is 31.41 kg/(m^2).  GENERAL: vitals reviewed and listed above, alert, oriented, appears well hydrated and in no acute distress  HEENT: atraumatic, conjunttiva clear, no obvious abnormalities on inspection of external nose and ears  NECK: no obvious masses on inspection  LUNGS: clear to auscultation bilaterally, no wheezes, rales or rhonchi, good air movement  CV: HRRR, no peripheral edema  MS: moves all extremities without noticeable abnormality -normal gait -mild effusion on inspection L knee -, neg val/var stress, no pat crepitus, neg ant/post drawer, neg lachman, Some pain lat jt line with mcmurry, no j sign or sif vmo atrophy  -NV intact distal PSYCH: pleasant and cooperative, no obvious depression or anxiety  ASSESSMENT AND PLAN:  Discussed the following assessment and plan:  Hyperglycemia - Plan: Hemoglobin A1c  Allergic rhinitis, unspecified allergic rhinitis type  Hot flashes - stbale  Left knee pain - discussed potential etiologies, she prefers to observe and see her orthopedic doc if persist - very little pain anymore  Depression - cont current tx  Essential hypertension, benign - Plan: Basic metabolic panel  Obesity - Plan: Lipid Panel  Dysuria: udip and cx, advised if neg and persist see gyn as she is concerned for another yeast infection and had sig rx to diflucan   -Patient advised to return or notify a doctor immediately if symptoms worsen or persist or new concerns arise.  There are no Patient Instructions on file for this visit.   Colin Benton R.

## 2014-02-09 NOTE — Patient Instructions (Signed)
BEFORE YOU LEAVE: -urine dip and culture -schedule fasting lab appointment -schedule follow up with me in 3 months  We recommend the following healthy lifestyle measures: - eat a healthy diet consisting of lots of vegetables, fruits, beans, nuts, seeds, healthy meats such as white chicken and fish and whole grains.  - avoid fried foods, fast food, processed foods, sodas, red meet and other fattening foods.  - get a least 150 minutes of aerobic exercise per week.

## 2014-02-10 ENCOUNTER — Other Ambulatory Visit (INDEPENDENT_AMBULATORY_CARE_PROVIDER_SITE_OTHER): Payer: Medicare HMO

## 2014-02-10 DIAGNOSIS — R739 Hyperglycemia, unspecified: Secondary | ICD-10-CM

## 2014-02-10 DIAGNOSIS — E669 Obesity, unspecified: Secondary | ICD-10-CM

## 2014-02-10 DIAGNOSIS — I10 Essential (primary) hypertension: Secondary | ICD-10-CM

## 2014-02-10 LAB — LIPID PANEL
Cholesterol: 203 mg/dL — ABNORMAL HIGH (ref 0–200)
HDL: 69.6 mg/dL (ref >39.00)
LDL Cholesterol: 114 mg/dL — ABNORMAL HIGH (ref 0–99)
NonHDL: 133.4
TRIGLYCERIDES: 98 mg/dL (ref 0.0–149.0)
Total CHOL/HDL Ratio: 3
VLDL: 19.6 mg/dL (ref 0.0–40.0)

## 2014-02-10 LAB — BASIC METABOLIC PANEL
BUN: 21 mg/dL (ref 6–23)
CO2: 34 mEq/L — ABNORMAL HIGH (ref 19–32)
Calcium: 9.4 mg/dL (ref 8.4–10.5)
Chloride: 97 mEq/L (ref 96–112)
Creatinine, Ser: 0.8 mg/dL (ref 0.4–1.2)
GFR: 68.74 mL/min (ref >60.00)
GLUCOSE: 93 mg/dL (ref 70–99)
POTASSIUM: 3.8 meq/L (ref 3.5–5.1)
Sodium: 135 mEq/L (ref 135–145)

## 2014-02-10 LAB — HEMOGLOBIN A1C: Hgb A1c MFr Bld: 6.3 % (ref 4.6–6.5)

## 2014-02-11 ENCOUNTER — Telehealth: Payer: Self-pay | Admitting: Family Medicine

## 2014-02-11 ENCOUNTER — Encounter: Payer: Self-pay | Admitting: *Deleted

## 2014-02-11 ENCOUNTER — Other Ambulatory Visit: Payer: Self-pay | Admitting: Family Medicine

## 2014-02-11 LAB — URINE CULTURE: Colony Count: >=100000

## 2014-02-11 MED ORDER — NITROFURANTOIN MONOHYD MACRO 100 MG PO CAPS: 100.0000 mg | ORAL_CAPSULE | Freq: Two times a day (BID) | ORAL | Status: DC

## 2014-02-11 NOTE — Telephone Encounter (Signed)
I left a message for the pt to return my call. 

## 2014-02-11 NOTE — Telephone Encounter (Signed)
Pt would like a print out of her test results from Monday and tues mailed to her home.  Pt states mychart too complicated to figure out and she wants something she can look at.  Also results of urine lab done as well. Verified pt's address.

## 2014-02-11 NOTE — Telephone Encounter (Signed)
Patient informed and letters were mailed to her home address with the results.

## 2014-02-13 ENCOUNTER — Telehealth: Payer: Self-pay | Admitting: *Deleted

## 2014-02-13 NOTE — Telephone Encounter (Signed)
Patient called stating she is very anxious about her lab test results and questioned what she can and cannot eat and was awaiting information from me.  I advised the pt I mailed this 2 days ago and it may not have went out that day so she should get this tomorrow or Monday.  I advised her again per Dr Maudie Mercury the labs reveal mild findings and in the meantime I put some paperwork together from our patient info board on carbs, ideas for carbs counting and 1200-1800 calorie diet information for her and this was left at the front desk for her to pick up.

## 2014-03-16 ENCOUNTER — Telehealth: Payer: Self-pay | Admitting: Family Medicine

## 2014-03-16 NOTE — Telephone Encounter (Signed)
Pt is having arthroscopic surgery on left knee  2/25 . Just wanted you to know.  Dr Latanya Maudlin is doing surgery

## 2014-03-16 NOTE — Telephone Encounter (Signed)
Ok. Tell her I will be thinking about her and hope it goes well! If needs preop exam needs to schedule this week...but does not look like she requested that so per haps her ortho office does this. Thanks.

## 2014-03-17 NOTE — Telephone Encounter (Signed)
Patient informed and states she does not need a pre-op exam.

## 2014-04-18 ENCOUNTER — Other Ambulatory Visit: Payer: Self-pay | Admitting: Family Medicine

## 2014-04-20 ENCOUNTER — Telehealth: Payer: Self-pay | Admitting: Family Medicine

## 2014-04-20 NOTE — Telephone Encounter (Signed)
Pt request refill of the following: venlafaxine (EFFEXOR) 75 MG tablet, hydrochlorothiazide (HYDRODIURIL) 25 MG tablet  90 day supply Pt is asking for a 1 year rx    Phamacy:  [facility]

## 2014-04-21 MED ORDER — HYDROCHLOROTHIAZIDE 25 MG PO TABS: 25.0000 mg | ORAL_TABLET | Freq: Every day | ORAL | Status: DC

## 2014-04-21 MED ORDER — VENLAFAXINE HCL 75 MG PO TABS: 37.5000 mg | ORAL_TABLET | Freq: Two times a day (BID) | ORAL | Status: DC

## 2014-05-11 ENCOUNTER — Ambulatory Visit: Payer: Medicare HMO | Admitting: Family Medicine

## 2014-05-18 ENCOUNTER — Ambulatory Visit (INDEPENDENT_AMBULATORY_CARE_PROVIDER_SITE_OTHER): Payer: Medicare HMO | Admitting: Family Medicine

## 2014-05-18 ENCOUNTER — Encounter: Payer: Self-pay | Admitting: Family Medicine

## 2014-05-18 VITALS — BP 124/72 | HR 97 | Temp 98.0°F | Ht 67.0 in | Wt 196.0 lb

## 2014-05-18 DIAGNOSIS — Z23 Encounter for immunization: Secondary | ICD-10-CM | POA: Diagnosis not present

## 2014-05-18 DIAGNOSIS — F329 Major depressive disorder, single episode, unspecified: Secondary | ICD-10-CM | POA: Diagnosis not present

## 2014-05-18 DIAGNOSIS — I10 Essential (primary) hypertension: Secondary | ICD-10-CM | POA: Diagnosis not present

## 2014-05-18 DIAGNOSIS — E2839 Other primary ovarian failure: Secondary | ICD-10-CM

## 2014-05-18 DIAGNOSIS — R232 Flushing: Secondary | ICD-10-CM

## 2014-05-18 DIAGNOSIS — F32A Depression, unspecified: Secondary | ICD-10-CM

## 2014-05-18 DIAGNOSIS — R739 Hyperglycemia, unspecified: Secondary | ICD-10-CM

## 2014-05-18 DIAGNOSIS — N951 Menopausal and female climacteric states: Secondary | ICD-10-CM | POA: Diagnosis not present

## 2014-05-18 MED ORDER — VENLAFAXINE HCL 75 MG PO TABS: 75.0000 mg | ORAL_TABLET | Freq: Two times a day (BID) | ORAL | Status: DC

## 2014-05-18 NOTE — Patient Instructions (Addendum)
BEFORE YOU LEAVE: -pneumococcal 23 vaccine -schedule follow up in 3-4 months and come fasting so that we can do your labs then  See if you can take one half tab of the effexor twice daily. If this works for you - please let us know so that we can change to a lower dose for you.  -We placed a referral for you as discussed for the bone density test. It usually takes about 1-2 weeks to process and schedule this referral. If you have not heard from [place] regarding this appointment in 2 weeks please contact our office.  We recommend the following healthy lifestyle measures: - eat a healthy diet consisting of lots of vegetables, fruits, beans, nuts, seeds, healthy meats such as white chicken and fish - avoid fried foods, fast food, processed foods, sodas, red meet and other fattening foods.  - get a least 150 minutes of aerobic exercise per week.

## 2014-05-18 NOTE — Addendum Note (Signed)
Addended by: Agnes Lawrence on: 05/18/2014 04:51 PM   Modules accepted: Orders

## 2014-05-18 NOTE — Progress Notes (Signed)
Pre visit review using our clinic review tool, if applicable. No additional management support is needed unless otherwise documented below in the visit note. 

## 2014-05-18 NOTE — Progress Notes (Signed)
HPI:  Depression/hot flashes: -she had tapered off effexor in 2015 as thought not needed then with worsening hot flashes she restarted and is take 75 mg  Bid (her rx said 37.5 bid) -sees gyn for women's health and breast health -reports: Doing ok -denies: SI worsening depression  HTN/Prediabetes/Mild Hyperlipidemia: -on hctz 25 mg daily and lifestyle recs advised -denies: CP, SOB, swelling, palpitations  AR: -meds: flonase and allegra prn -reports: stable  Knee Pain: -seeing Dr. Latanya Maudlin for this - s/p arthroscopic knee surgery 03/2014 -reports:  HM: -dexa- sees gyn -PPSV23  ROS: See pertinent positives and negatives per HPI.  Past Medical History  Diagnosis Date  . History of breast cancer 2008    DCIS/invasive ductal cancer right breast ER+/PR+  . Hypertension   . Anemia   . PUD (peptic ulcer disease) 8/94    Dr Magod-endoscopy  . Breast cyst 12/95    right breast  . Colon polyps   . Osteopenia   . Squamous cell carcinoma 6/11 7/11    on leg-removed  . Bleeding 1993    BTB on HRT-endo biopsy proe with focal breakdown  . Bleeding 1995    BTB on HRT-benign endo biopsy  . Piriformis muscle pain   . Plantar fasciitis   . Depression   . Ulcer   . Seasonal allergies   . UTI (urinary tract infection)   . Migraine   . Hot flashes 02/09/2014  . Hyperglycemia 02/09/2014    Prediabetes labs 03/2013 at [facility]     Past Surgical History  Procedure Laterality Date  . Dilation and curettage of uterus  50+ years ago    for retained placenta  . Breast lumpectomy  06/06/2006    right and sentinel node biopsy  . Dilation and curettage of uterus  1986    Family History  Problem Relation Age of Onset  . Heart attack Brother   . Diabetes Other     granddaughter  . Kidney failure Sister   . Arthritis Father   . Ovarian cancer Mother   . Hypertension Mother     History   Social History  . Marital Status: Widowed    Spouse Name: N/A  . Number of Children: N/A  .  Years of Education: N/A   Social History Main Topics  . Smoking status: Former Smoker    Quit date: 06/27/1965  . Smokeless tobacco: Never Used  . Alcohol Use: No  . Drug Use: No  . Sexual Activity: No   Other Topics Concern  . 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  Ascorbic Acid (VITAMIN C PO), Take 1,000 mg by mouth daily. , Disp: , Rfl:  .  BIOTIN PO, Take by mouth., Disp: , Rfl:  .  CALCIUM PO, Take 600 mg by mouth daily. , Disp: , Rfl:  .  Coenzyme Q10 (CO Q 10 PO), Take by mouth daily., Disp: , Rfl:  .  CRANBERRY PO, Take 84 mg by mouth daily. , Disp: , Rfl:  .  docusate sodium (COLACE) 100 MG capsule, Take 100 mg by mouth 2 (two) times daily., Disp: , Rfl:  .  Fexofenadine HCl (ALLEGRA PO), Take 0.5 tablets by mouth 2 (two) times daily. , Disp: , Rfl:  .  fish oil-omega-3 fatty acids 1000 MG capsule, Take 2 g by mouth daily., Disp: , Rfl:  .  Garlic 9038 MG CAPS, Take 1,000 mg by mouth daily., Disp: , Rfl:  .  hydrochlorothiazide (HYDRODIURIL) 25 MG tablet, Take 1 tablet (25 mg total) by mouth daily., Disp: 90 tablet, Rfl: 3 .  Multiple Vitamin (MULTIVITAMIN) capsule, Take 1 capsule by mouth daily.  , Disp: , Rfl:  .  Probiotic Product (PROBIOTIC DAILY PO), Take by mouth daily., Disp: , Rfl:  .  venlafaxine (EFFEXOR) 75 MG tablet, Take 1 tablet (75 mg total) by mouth 2 (two) times daily with a meal., Disp: 180 tablet, Rfl: 3 .  VITAMIN D, CHOLECALCIFEROL, PO, Take 4,000 Int'l Units by mouth. , Disp: , Rfl:   EXAM:  Filed Vitals:   05/18/14 1612  BP: 124/72  Pulse: 97  Temp: 98 F (36.7 C)    Body mass index is 30.69 kg/(m^2).  GENERAL: vitals reviewed and listed above, alert, oriented, appears well hydrated and in no acute  distress  HEENT: atraumatic, conjunttiva clear, no obvious abnormalities on inspection of external nose and ears  NECK: no obvious masses on inspection  LUNGS: clear to auscultation bilaterally, no wheezes, rales or rhonchi, good air movement  CV: HRRR, no peripheral edema  MS: moves all extremities without noticeable abnormality  PSYCH: pleasant and cooperative, no obvious depression or anxiety  ASSESSMENT AND PLAN:  Discussed the following assessment and plan:  Depression Hot flashes -she is going to try half dose effexor bid and see if this works  Essential hypertension, benign -stable  Hyperglycemia -lifestyle recs, does not want to do labs today, wants to do neck time  Estrogen deficiency - Plan: DG Bone Density   -Patient advised to return or notify a doctor immediately if symptoms worsen or persist or new concerns arise.  Patient Instructions  BEFORE YOU LEAVE: -pneumococcal 23 vaccine -schedule follow up in 3-4 months and come fasting so that we can do your labs then  See if you can take one half tab of the effexor twice daily. If this works for you - please let us know so that we can change to a lower dose for you.  -We placed a referral for you as discussed for the bone density test. It usually takes about 1-2 weeks to process and schedule this referral. If you have not heard from [place] regarding this appointment in 2 weeks please contact our office.  We recommend the following healthy lifestyle measures: - eat a healthy diet consisting of lots of vegetables, fruits, beans, nuts, seeds, healthy meats such as white chicken and fish - avoid fried foods, fast food, processed foods, sodas, red meet and other fattening foods.  - get a least 150 minutes of aerobic exercise per week.       Colin Benton R.

## 2014-05-25 ENCOUNTER — Ambulatory Visit (INDEPENDENT_AMBULATORY_CARE_PROVIDER_SITE_OTHER)
Admission: RE | Admit: 2014-05-25 | Discharge: 2014-05-25 | Disposition: A | Discharge status: Home / Self Care | Payer: Medicare HMO | Source: Ambulatory Visit | Attending: Family Medicine | Admitting: Family Medicine

## 2014-05-25 DIAGNOSIS — E2839 Other primary ovarian failure: Secondary | ICD-10-CM | POA: Diagnosis not present

## 2014-06-02 ENCOUNTER — Telehealth: Payer: Self-pay | Admitting: *Deleted

## 2014-06-02 NOTE — Telephone Encounter (Signed)
Patient was informed of the results and states she is already taking 4000IU of vitamin D-what should she do? States she is taking 600mg  oc calcium and will increase this to 1200mg .  Message forwarded to Dr Maudie Mercury.

## 2014-06-02 NOTE — Telephone Encounter (Signed)
Advise continuing this and recheck in 2 years. Thanks.

## 2014-06-02 NOTE — Telephone Encounter (Signed)
-----   Message from Lucretia Kern, DO sent at 06/01/2014 12:06 PM EDT ----- Dexa shows low bone density borderline - advise Vit D [phone] IU daily in a supplement and adequate dietary intake of calcium (1200mg  daily) along with regular weight bearing exercise. Repeat in 2 years.

## 2014-06-02 NOTE — Telephone Encounter (Signed)
Patient informed. 

## 2014-06-05 ENCOUNTER — Encounter: Payer: Self-pay | Admitting: Family Medicine

## 2014-08-19 ENCOUNTER — Other Ambulatory Visit: Payer: Self-pay

## 2014-08-19 DIAGNOSIS — Z9889 Other specified postprocedural states: Secondary | ICD-10-CM

## 2014-08-19 DIAGNOSIS — Z853 Personal history of malignant neoplasm of breast: Secondary | ICD-10-CM

## 2014-08-19 DIAGNOSIS — Z1231 Encounter for screening mammogram for malignant neoplasm of breast: Secondary | ICD-10-CM

## 2014-08-24 ENCOUNTER — Encounter: Payer: Self-pay | Admitting: Family Medicine

## 2014-08-24 ENCOUNTER — Ambulatory Visit (INDEPENDENT_AMBULATORY_CARE_PROVIDER_SITE_OTHER): Payer: Medicare HMO | Admitting: Family Medicine

## 2014-08-24 VITALS — BP 128/80 | HR 86 | Temp 97.6°F | Ht 67.0 in | Wt 190.4 lb

## 2014-08-24 DIAGNOSIS — R739 Hyperglycemia, unspecified: Secondary | ICD-10-CM | POA: Diagnosis not present

## 2014-08-24 DIAGNOSIS — E785 Hyperlipidemia, unspecified: Secondary | ICD-10-CM | POA: Diagnosis not present

## 2014-08-24 DIAGNOSIS — F39 Unspecified mood [affective] disorder: Secondary | ICD-10-CM

## 2014-08-24 DIAGNOSIS — I10 Essential (primary) hypertension: Secondary | ICD-10-CM | POA: Diagnosis not present

## 2014-08-24 DIAGNOSIS — N951 Menopausal and female climacteric states: Secondary | ICD-10-CM

## 2014-08-24 DIAGNOSIS — R232 Flushing: Secondary | ICD-10-CM

## 2014-08-24 LAB — LIPID PANEL
Cholesterol: 206 mg/dL — ABNORMAL HIGH (ref 0–200)
HDL: 74.5 mg/dL (ref >39.00)
LDL CALC: 112 mg/dL — AB (ref 0–99)
NonHDL: 131.5
Total CHOL/HDL Ratio: 3
Triglycerides: 96 mg/dL (ref 0.0–149.0)
VLDL: 19.2 mg/dL (ref 0.0–40.0)

## 2014-08-24 LAB — BASIC METABOLIC PANEL
BUN: 17 mg/dL (ref 6–23)
CO2: 32 meq/L (ref 19–32)
Calcium: 9.5 mg/dL (ref 8.4–10.5)
Chloride: 102 mEq/L (ref 96–112)
Creatinine, Ser: 0.78 mg/dL (ref 0.40–1.20)
GFR: 74.78 mL/min (ref >60.00)
Glucose, Bld: 89 mg/dL (ref 70–99)
Potassium: 4.3 mEq/L (ref 3.5–5.1)
Sodium: 141 mEq/L (ref 135–145)

## 2014-08-24 LAB — HEMOGLOBIN A1C: Hgb A1c MFr Bld: 5.6 % (ref 4.6–6.5)

## 2014-08-24 NOTE — Patient Instructions (Signed)
Please set up you annual exam in abut 4-6 months  We recommend the following healthy lifestyle measures: - eat a healthy diet consisting of lots of vegetables, fruits, beans, nuts, seeds, healthy meats such as white chicken and fish and whole grains.  - avoid fried foods, fast food, processed foods, sodas, red meet and other fattening foods.  - get a least 150 minutes of aerobic exercise per week.   Please get you mammogram

## 2014-08-24 NOTE — Progress Notes (Signed)
Pre visit review using our clinic review tool, if applicable. No additional management support is needed unless otherwise documented below in the visit note. 

## 2014-08-24 NOTE — Progress Notes (Signed)
HPI:   Depression/hot flashes: -she had tapered off effexor in 2015 as thought not needed then with worsening hot flashes she restarted and is taing 75 mgin the am and 37.5 in the pm -sees gyn for women's health and breast health -reports: Doing ok -denies: SI worsening depression  HTN/Prediabetes/Mild Hyperlipidemia: -on hctz 25 mg daily and lifestyle recs advised -working on diet -denies: CP, SOB, swelling, palpitations  AR: -meds: flonase and allegra prn -reports: stable  Knee Pain: -seeing Dr. Latanya Maudlin for this - s/p arthroscopic knee surgery 03/2014  HM: -mammo: she has already scheduled this  ROS: See pertinent positives and negatives per HPI.  Past Medical History  Diagnosis Date  . History of breast cancer 2008    DCIS/invasive ductal cancer right breast ER+/PR+  . Hypertension   . Anemia   . PUD (peptic ulcer disease) 8/94    Dr Magod-endoscopy  . Breast cyst 12/95    right breast  . Colon polyps   . Osteopenia   . Squamous cell carcinoma 6/11 7/11    on leg-removed  . Bleeding 1993    BTB on HRT-endo biopsy proe with focal breakdown  . Bleeding 1995    BTB on HRT-benign endo biopsy  . Piriformis muscle pain   . Plantar fasciitis   . Depression   . Ulcer   . Seasonal allergies   . UTI (urinary tract infection)   . Migraine   . Hot flashes 02/09/2014  . Hyperglycemia 02/09/2014    Prediabetes labs 03/2013 at [facility]     Past Surgical History  Procedure Laterality Date  . Dilation and curettage of uterus  50+ years ago    for retained placenta  . Breast lumpectomy  06/06/2006    right and sentinel node biopsy  . Dilation and curettage of uterus  1986    Family History  Problem Relation Age of Onset  . Heart attack Brother   . Diabetes Other     granddaughter  . Kidney failure Sister   . Arthritis Father   . Ovarian cancer Mother   . Hypertension Mother     History   Social History  . Marital Status: Widowed    Spouse Name: N/A  . Number  of Children: N/A  . Years of Education: N/A   Social History Main Topics  . Smoking status: Former Smoker    Quit date: 06/27/1965  . Smokeless tobacco: Never Used  . Alcohol Use: No  . Drug Use: No  . Sexual Activity: No   Other Topics Concern  . 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  Ascorbic Acid (VITAMIN C PO), Take 1,000 mg by mouth daily. , Disp: , Rfl:  .  CALCIUM PO, Take 600 mg by mouth daily. , Disp: , Rfl:  .  Coenzyme Q10 (CO Q 10 PO), Take by mouth daily., Disp: , Rfl:  .  CRANBERRY PO, Take 84 mg by mouth daily. , Disp: , Rfl:  .  docusate sodium (COLACE) 100 MG capsule, Take 100 mg by mouth 2 (two) times daily., Disp: , Rfl:  .  Fexofenadine HCl (ALLEGRA PO), Take  0.5 tablets by mouth 2 (two) times daily. , Disp: , Rfl:  .  fish oil-omega-3 fatty acids 1000 MG capsule, Take 2 g by mouth daily., Disp: , Rfl:  .  Garlic 4982 MG CAPS, Take 1,000 mg by mouth daily., Disp: , Rfl:  .  hydrochlorothiazide (HYDRODIURIL) 25 MG tablet, Take 1 tablet (25 mg total) by mouth daily., Disp: 90 tablet, Rfl: 3 .  Multiple Vitamin (MULTIVITAMIN) capsule, Take 1 capsule by mouth daily.  , Disp: , Rfl:  .  NON FORMULARY, Tumeric, Disp: , Rfl:  .  Probiotic Product (PROBIOTIC DAILY PO), Take by mouth daily., Disp: , Rfl:  .  venlafaxine (EFFEXOR) 75 MG tablet, Take 1 tablet (75 mg total) by mouth 2 (two) times daily with a meal. (Patient taking differently: Take 75 mg by mouth 2 (two) times daily with a meal. Takes 1 in the morning and 1/2 in the evening), Disp: 180 tablet, Rfl: 3 .  VITAMIN D, CHOLECALCIFEROL, PO, Take 4,000 Int'l Units by mouth. , Disp: , Rfl:   EXAM:  Filed Vitals:   08/24/14 0759  BP: 128/80  Pulse: 86  Temp: 97.6 F (36.4  C)    Body mass index is 29.81 kg/(m^2).  GENERAL: vitals reviewed and listed above, alert, oriented, appears well hydrated and in no acute distress  HEENT: atraumatic, conjunttiva clear, no obvious abnormalities on inspection of external nose and ears  NECK: no obvious masses on inspection  LUNGS: clear to auscultation bilaterally, no wheezes, rales or rhonchi, good air movement  CV: HRRR, no peripheral edema  MS: moves all extremities without noticeable abnormality  PSYCH: pleasant and cooperative, no obvious depression or anxiety  ASSESSMENT AND PLAN:  Discussed the following assessment and plan:  Essential hypertension, benign - Plan: Basic metabolic panel  Hyperglycemia - Plan: Hemoglobin A1c  Hot flashes  Depression  Hyperlipemia - Plan: Lipid panel  -Patient advised to return or notify a doctor immediately if symptoms worsen or persist or new concerns arise.  Patient Instructions  Please set up you annual exam in abut 4-6 months  We recommend the following healthy lifestyle measures: - eat a healthy diet consisting of lots of vegetables, fruits, beans, nuts, seeds, healthy meats such as white chicken and fish and whole grains.  - avoid fried foods, fast food, processed foods, sodas, red meet and other fattening foods.  - get a least 150 minutes of aerobic exercise per week.   Please get you mammogram     Colin Benton R.

## 2014-10-07 ENCOUNTER — Ambulatory Visit
Admission: RE | Admit: 2014-10-07 | Discharge: 2014-10-07 | Disposition: A | Discharge status: Home / Self Care | Payer: Medicare HMO | Source: Ambulatory Visit

## 2014-10-07 DIAGNOSIS — Z9889 Other specified postprocedural states: Secondary | ICD-10-CM

## 2014-10-07 DIAGNOSIS — Z1231 Encounter for screening mammogram for malignant neoplasm of breast: Secondary | ICD-10-CM

## 2014-10-07 DIAGNOSIS — Z853 Personal history of malignant neoplasm of breast: Secondary | ICD-10-CM

## 2014-10-21 ENCOUNTER — Telehealth: Payer: Self-pay | Admitting: *Deleted

## 2014-10-21 ENCOUNTER — Ambulatory Visit (INDEPENDENT_AMBULATORY_CARE_PROVIDER_SITE_OTHER): Payer: Medicare HMO | Admitting: Family Medicine

## 2014-10-21 ENCOUNTER — Encounter: Payer: Self-pay | Admitting: Family Medicine

## 2014-10-21 VITALS — BP 140/78 | HR 92 | Temp 98.0°F | Ht 67.0 in | Wt 189.5 lb

## 2014-10-21 DIAGNOSIS — Z23 Encounter for immunization: Secondary | ICD-10-CM | POA: Diagnosis not present

## 2014-10-21 DIAGNOSIS — R300 Dysuria: Secondary | ICD-10-CM | POA: Diagnosis not present

## 2014-10-21 LAB — POCT URINALYSIS DIPSTICK
Bilirubin, UA: NEGATIVE
Blood, UA: POSITIVE
GLUCOSE UA: NEGATIVE
Ketones, UA: NEGATIVE
NITRITE UA: NEGATIVE
Protein, UA: NEGATIVE
Spec Grav, UA: 1.02
UROBILINOGEN UA: 0.2
pH, UA: 6

## 2014-10-21 LAB — URINALYSIS, MICROSCOPIC ONLY

## 2014-10-21 MED ORDER — NITROFURANTOIN MACROCRYSTAL 100 MG PO CAPS: 100.0000 mg | ORAL_CAPSULE | Freq: Four times a day (QID) | ORAL | Status: DC

## 2014-10-21 NOTE — Progress Notes (Signed)
HPI:   Acute visit for:  Dysuria: -started 1-2 weeks ago -symptoms: mild dysuria, and frequency -denies: flank pain, NVD, hematuria, fevers -hx of UTI and feels may be getting one  ROS: See pertinent positives and negatives per HPI.  Past Medical History  Diagnosis Date  . History of breast cancer 2008    DCIS/invasive ductal cancer right breast ER+/PR+  . Hypertension   . Anemia   . PUD (peptic ulcer disease) 8/94    Dr Magod-endoscopy  . Breast cyst 12/95    right breast  . Colon polyps   . Osteopenia   . Squamous cell carcinoma 6/11 7/11    on leg-removed  . Bleeding 1993    BTB on HRT-endo biopsy proe with focal breakdown  . Bleeding 1995    BTB on HRT-benign endo biopsy  . Piriformis muscle pain   . Plantar fasciitis   . Depression   . Ulcer   . Seasonal allergies   . UTI (urinary tract infection)   . Migraine   . Hot flashes 02/09/2014  . Hyperglycemia 02/09/2014    Prediabetes labs 03/2013 at [facility]     Past Surgical History  Procedure Laterality Date  . Dilation and curettage of uterus  50+ years ago    for retained placenta  . Breast lumpectomy  06/06/2006    right and sentinel node biopsy  . Dilation and curettage of uterus  1986    Family History  Problem Relation Age of Onset  . Heart attack Brother   . Diabetes Other     granddaughter  . Kidney failure Sister   . Arthritis Father   . Ovarian cancer Mother   . Hypertension Mother     Social History   Social History  . Marital Status: Widowed    Spouse Name: N/A  . Number of Children: N/A  . Years of Education: N/A   Social History Main Topics  . Smoking status: Former Smoker    Quit date: 06/27/1965  . Smokeless tobacco: Never Used  . Alcohol Use: No  . Drug Use: No  . Sexual Activity: No   Other Topics Concern  . None   Social History Narrative   Work or School: retired, [employer]chant [employer] officer Situation: lives alone      Spiritual Beliefs:       Lifestyle: no regular  exercise - is going to get back into silver sneakers; diet is good              Current outpatient prescriptions:  .  Ascorbic Acid (VITAMIN C PO), Take 1,000 mg by mouth daily. , Disp: , Rfl:  .  CALCIUM PO, Take 600 mg by mouth daily. , Disp: , Rfl:  .  Coenzyme Q10 (CO Q 10 PO), Take by mouth daily., Disp: , Rfl:  .  CRANBERRY PO, Take 84 mg by mouth daily. , Disp: , Rfl:  .  docusate sodium (COLACE) 100 MG capsule, Take 100 mg by mouth 2 (two) times daily., Disp: , Rfl:  .  Fexofenadine HCl (ALLEGRA PO), Take 0.5 tablets by mouth 2 (two) times daily. , Disp: , Rfl:  .  fish oil-omega-3 fatty acids 1000 MG capsule, Take 2 g by mouth daily., Disp: , Rfl:  .  Garlic 4128 MG CAPS, Take 1,000 mg by mouth daily., Disp: , Rfl:  .  hydrochlorothiazide (HYDRODIURIL) 25 MG tablet, Take 1 tablet (25 mg total) by mouth daily., Disp: 90 tablet, Rfl:  3 .  Multiple Vitamin (MULTIVITAMIN) capsule, Take 1 capsule by mouth daily.  , Disp: , Rfl:  .  NON FORMULARY, Tumeric, Disp: , Rfl:  .  Probiotic Product (PROBIOTIC DAILY PO), Take by mouth daily., Disp: , Rfl:  .  venlafaxine (EFFEXOR) 75 MG tablet, Take 1 tablet (75 mg total) by mouth 2 (two) times daily with a meal. (Patient taking differently: Take 75 mg by mouth 2 (two) times daily with a meal. Takes 1 in the morning and 1/2 in the evening), Disp: 180 tablet, Rfl: 3 .  VITAMIN D, CHOLECALCIFEROL, PO, Take 4,000 Int'l Units by mouth. , Disp: , Rfl:  .  nitrofurantoin (MACRODANTIN) 100 MG capsule, Take 1 capsule (100 mg total) by mouth 4 (four) times daily., Disp: 14 capsule, Rfl: 0  EXAM:  Filed Vitals:   10/21/14 1117  BP: 140/78  Pulse: 92  Temp: 98 F (36.7 C)    Body mass index is 29.67 kg/(m^2).  GENERAL: vitals reviewed and listed above, alert, oriented, appears well hydrated and in no acute distress  HEENT: atraumatic, conjunttiva clear, no obvious abnormalities on inspection of external nose and ears  NECK: no obvious masses  on inspection  LUNGS: clear to auscultation bilaterally, no wheezes, rales or rhonchi, good air movement  CV: HRRR, no peripheral edema  ABD: BS+, soft, NTTP, no CVA TTP  MS: moves all extremities without noticeable abnormality  PSYCH: pleasant and cooperative, no obvious depression or anxiety  ASSESSMENT AND PLAN:  Discussed the following assessment and plan:  Dysuria - Plan: POC Urinalysis Dipstick, Urine Microscopic Only, Urine culture  -udip with leuks and blood, micro and culture pending -discussed empiric abx and she opted to take script of nitrofurantoin to use if worsens while awaiting results -plenty of fluids, cranberry in interim -Patient advised to return or notify a doctor immediately if symptoms worsen or persist or new concerns arise.  There are no Patient Instructions on file for this visit.   Colin Benton R.

## 2014-10-21 NOTE — Progress Notes (Signed)
Pre visit review using our clinic review tool, if applicable. No additional management support is needed unless otherwise documented below in the visit note. 

## 2014-10-21 NOTE — Telephone Encounter (Signed)
Patient wanted to ask Dr Maudie Mercury two questions she forgot to mention during her appt: 1) at her last visit she discussed the dose of Venlafaxine and wanted to know if she could take 2 tablets instead of 1.5 and this was not sent to [facility] [address]   2)also wanted Dr Maudie Mercury to know she is aware she is diabetic but has had hypogylcemia before and experiences a lot of the symptoms now such as: feeling shaky, sweaty, weak and tired for the past few years especially if she does anything extreme or yard work and questioned what to do?

## 2014-10-21 NOTE — Telephone Encounter (Signed)
Left a message for the pt to return my call.  

## 2014-10-21 NOTE — Telephone Encounter (Signed)
1)the dose on the rx is 1 tablet twice daily, she reported taking 1 in the morning and 1/2 at night. Ok to take 75mg  bid if she wishes. Please refill if refills needed for 1 year. 2)She should address this at a visit. She is not taking any medications for her diabetes. In the interim, she should stay hydrated when working outside and avoid overheating. Also, eating small regular meals and avoiding sweets and white starches can help keep BS levels more even.

## 2014-10-22 NOTE — Telephone Encounter (Signed)
Left a message for the pt to return my call.  

## 2014-10-23 NOTE — Telephone Encounter (Signed)
I left a detailed message with the information below at the pts home number. 

## 2014-10-24 LAB — URINE CULTURE: Colony Count: >=100000

## 2014-12-07 ENCOUNTER — Encounter: Payer: Self-pay | Admitting: Family Medicine

## 2014-12-07 ENCOUNTER — Ambulatory Visit (INDEPENDENT_AMBULATORY_CARE_PROVIDER_SITE_OTHER): Payer: Medicare HMO | Admitting: Family Medicine

## 2014-12-07 VITALS — BP 138/82 | HR 89 | Temp 98.3°F | Ht 67.0 in | Wt 190.1 lb

## 2014-12-07 DIAGNOSIS — F3342 Major depressive disorder, recurrent, in full remission: Secondary | ICD-10-CM | POA: Diagnosis not present

## 2014-12-07 DIAGNOSIS — Z853 Personal history of malignant neoplasm of breast: Secondary | ICD-10-CM

## 2014-12-07 DIAGNOSIS — Z111 Encounter for screening for respiratory tuberculosis: Secondary | ICD-10-CM

## 2014-12-07 DIAGNOSIS — R739 Hyperglycemia, unspecified: Secondary | ICD-10-CM | POA: Diagnosis not present

## 2014-12-07 DIAGNOSIS — J309 Allergic rhinitis, unspecified: Secondary | ICD-10-CM | POA: Diagnosis not present

## 2014-12-07 DIAGNOSIS — I10 Essential (primary) hypertension: Secondary | ICD-10-CM

## 2014-12-07 DIAGNOSIS — R69 Illness, unspecified: Secondary | ICD-10-CM | POA: Diagnosis not present

## 2014-12-07 NOTE — Progress Notes (Signed)
Pre visit review using our clinic review tool, if applicable. No additional management support is needed unless otherwise documented below in the visit note. 

## 2014-12-07 NOTE — Progress Notes (Signed)
HPI:  Christine Maldonado is a pleasant 81 yo F with a PMH significant for HTN, seasonal allergies, depression and breast ca, here for a check up and to complete a form for retirement community application. She reports she is feeling well without any complaints. She denies depressed mood, any falls, trouble getting around or taking her medications.  ROS: See pertinent positives and negatives per HPI.  Past Medical History  Diagnosis Date  . History of breast cancer 2008    DCIS/invasive ductal cancer right breast ER+/PR+  . Hypertension   . Anemia   . PUD (peptic ulcer disease) 8/94    Dr Magod-endoscopy  . Breast cyst 12/95    right breast  . Colon polyps   . Osteopenia   . Squamous cell carcinoma ([place]) 6/11 7/11    on leg-removed  . Bleeding 1993    BTB on HRT-endo biopsy proe with focal breakdown  . Bleeding 1995    BTB on HRT-benign endo biopsy  . Piriformis muscle pain   . Plantar fasciitis   . Depression   . Ulcer   . Seasonal allergies   . UTI (urinary tract infection)   . Migraine   . Hot flashes 02/09/2014  . Hyperglycemia 02/09/2014    Prediabetes labs 03/2013 at [facility]     Past Surgical History  Procedure Laterality Date  . Dilation and curettage of uterus  50+ years ago    for retained placenta  . Breast lumpectomy  06/06/2006    right and sentinel node biopsy  . Dilation and curettage of uterus  1986    Family History  Problem Relation Age of Onset  . Heart attack Brother   . Diabetes Other     granddaughter  . Kidney failure Sister   . Arthritis Father   . Ovarian cancer Mother   . Hypertension Mother     Social History   Social History  . Marital Status: Widowed    Spouse Name: N/A  . Number of Children: N/A  . Years of Education: N/A   Social History Main Topics  . Smoking status: Former Smoker    Quit date: 06/27/1965  . Smokeless tobacco: Never Used  . Alcohol Use: No  . Drug Use: No  . Sexual Activity: No   Other Topics Concern  .  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  Ascorbic Acid (VITAMIN C PO), Take 1,000 mg by mouth daily. , Disp: , Rfl:  .  CALCIUM PO, Take 600 mg by mouth daily. , Disp: , Rfl:  .  Coenzyme Q10 (CO Q 10 PO), Take by mouth daily., Disp: , Rfl:  .  CRANBERRY PO, Take 84 mg by mouth daily. , Disp: , Rfl:  .  docusate sodium (COLACE) 100 MG capsule, Take 100 mg by mouth 2 (two) times daily., Disp: , Rfl:  .  Fexofenadine HCl (ALLEGRA PO), Take 0.5 tablets by mouth 2 (two) times daily. , Disp: , Rfl:  .  fish oil-omega-3 fatty acids 1000 MG capsule, Take 2 g by mouth daily., Disp: , Rfl:  .  Garlic 0109 MG CAPS, Take 1,000  mg by mouth daily., Disp: , Rfl:  .  hydrochlorothiazide (HYDRODIURIL) 25 MG tablet, Take 1 tablet (25 mg total) by mouth daily., Disp: 90 tablet, Rfl: 3 .  Multiple Vitamin (MULTIVITAMIN) capsule, Take 1 capsule by mouth daily.  , Disp: , Rfl:  .  nitrofurantoin (MACRODANTIN) 100 MG capsule, Take 1 capsule (100 mg total) by mouth 4 (four) times daily., Disp: 14 capsule, Rfl: 0 .  NON FORMULARY, Tumeric, Disp: , Rfl:  .  Probiotic Product (PROBIOTIC DAILY PO), Take by mouth daily., Disp: , Rfl:  .  venlafaxine (EFFEXOR) 75 MG tablet, Take 1 tablet (75 mg total) by mouth 2 (two) times daily with a meal. (Patient taking differently: Take 75 mg by mouth 2 (two) times daily with a meal. Takes 1 in the morning and 1/2 in the evening), Disp: 180 tablet, Rfl: 3 .  VITAMIN D, CHOLECALCIFEROL, PO, Take 4,000 Int'l Units by mouth. , Disp: , Rfl:   EXAM:  Filed Vitals:   12/07/14 0901  BP: 138/82  Pulse: 89  Temp: 98.3 F (36.8 C)    Body mass index is 29.77 kg/(m^2).  GENERAL: vitals reviewed and listed above, alert, oriented, appears well  hydrated and in no acute distress  HEENT: atraumatic, conjunttiva clear, no obvious abnormalities on inspection of external nose and ears  NECK: no obvious masses on inspection  LUNGS: clear to auscultation bilaterally, no wheezes, rales or rhonchi, good air movement  CV: HRRR, no peripheral edema  MS: moves all extremities without noticeable abnormality  PSYCH: pleasant and cooperative, no obvious depression or anxiety  ASSESSMENT AND PLAN:  Discussed the following assessment and plan:  Essential hypertension, benign  Recurrent major depressive disorder, in full remission ([place])  Hyperglycemia  Allergic rhinitis, unspecified allergic rhinitis type  hx: breast cancer, right IDC receptor + her 2 -  -form completed -TB skin test per application form -follow up in 3-4 months -Patient advised to return or notify a doctor immediately if symptoms worsen or persist or new concerns arise.  Patient Instructions  Before You Leave: -TB skin test -cancel 2 week appt and set up follow up appt. In 3-4 months  We recommend the following healthy lifestyle measures: - eat a healthy whole foods diet consisting of regular small meals composed of vegetables, fruits, beans, nuts, seeds, healthy meats such as white chicken and fish and whole grains.  - avoid sweets, white starchy foods, fried foods, fast food, processed foods, sodas, red meet and other fattening foods.  - get a least 150-300 minutes of aerobic exercise per week.       Colin Benton R.

## 2014-12-07 NOTE — Patient Instructions (Signed)
Before You Leave: -TB skin test -cancel 2 week appt and set up follow up appt. In 3-4 months  We recommend the following healthy lifestyle measures: - eat a healthy whole foods diet consisting of regular small meals composed of vegetables, fruits, beans, nuts, seeds, healthy meats such as white chicken and fish and whole grains.  - avoid sweets, white starchy foods, fried foods, fast food, processed foods, sodas, red meet and other fattening foods.  - get a least 150-300 minutes of aerobic exercise per week.

## 2014-12-07 NOTE — Addendum Note (Signed)
Addended by: Agnes Lawrence on: 12/07/2014 09:46 AM   Modules accepted: Orders

## 2014-12-09 LAB — TB SKIN TEST
INDURATION: 0 mm
TB SKIN TEST: NEGATIVE

## 2014-12-21 ENCOUNTER — Ambulatory Visit: Payer: Medicare HMO | Admitting: Family Medicine

## 2015-02-23 ENCOUNTER — Encounter: Payer: Self-pay | Admitting: Obstetrics & Gynecology

## 2015-02-23 ENCOUNTER — Ambulatory Visit (INDEPENDENT_AMBULATORY_CARE_PROVIDER_SITE_OTHER): Payer: Medicare HMO | Admitting: Obstetrics & Gynecology

## 2015-02-23 VITALS — BP 122/78 | HR 78 | Resp 16 | Ht 67.0 in | Wt 192.0 lb

## 2015-02-23 DIAGNOSIS — Z01419 Encounter for gynecological examination (general) (routine) without abnormal findings: Secondary | ICD-10-CM

## 2015-02-23 NOTE — Progress Notes (Signed)
82 y.o. G2P2 WidowedCaucasianF here for annual exam.  Doing well.  Denies vaginal bleeding.  Reports she had an arthroscopy last year.  Says after her surgery she had no pain.    Moving into [facility].  PCP:  Dr. Maudie Mercury  Patient's last menstrual period was 01/30/1993.          Sexually active: No.  The current method of family planning is post menopausal status.    Exercising: Yes.    active Smoker:  no  Health Maintenance: Pap:  11/11/12 Neg History of abnormal Pap:  no MMG:  10/07/14 BIRADS1:neg Colonoscopy:  05/08/12 hemorrhoids  BMD:  05/31/14, -1.6 femur  TDaP:  2013  Pneumonia vaccines and shingles vaccines completed Screening Labs: PCP, Hb today: PCP, Urine today: PCP   reports that she quit smoking about 49 years ago. She has never used smokeless tobacco. She reports that she does not drink alcohol or use illicit drugs.  Past Medical History  Diagnosis Date  . History of breast cancer 2008    DCIS/invasive ductal cancer right breast ER+/PR+  . Hypertension   . Anemia   . PUD (peptic ulcer disease) 8/94    Dr Magod-endoscopy  . Breast cyst 12/95    right breast  . Colon polyps   . Osteopenia   . Squamous cell carcinoma ([place]) 6/11 7/11    on leg-removed  . Bleeding 1993    BTB on HRT-endo biopsy proe with focal breakdown  . Bleeding 1995    BTB on HRT-benign endo biopsy  . Piriformis muscle pain   . Plantar fasciitis   . Depression   . Ulcer   . Seasonal allergies   . UTI (urinary tract infection)   . Migraine   . Hot flashes 02/09/2014  . Hyperglycemia 02/09/2014    Prediabetes labs 03/2013 at [facility]     Past Surgical History  Procedure Laterality Date  . Dilation and curettage of uterus  50+ years ago    for retained placenta  . Breast lumpectomy  06/06/2006    right and sentinel node biopsy  . Dilation and curettage of uterus  1986    Current Outpatient Prescriptions  Medication Sig Dispense Refill  . Ascorbic Acid (VITAMIN C PO) Take 1,000 mg by mouth  daily.     Marland Kitchen CALCIUM PO Take 600 mg by mouth daily.     . Coenzyme Q10 (CO Q 10 PO) Take by mouth daily.    Marland Kitchen CRANBERRY PO Take 84 mg by mouth daily.     Marland Kitchen docusate sodium (COLACE) 100 MG capsule Take 100 mg by mouth 2 (two) times daily.    Marland Kitchen Fexofenadine HCl (ALLEGRA PO) Take 0.5 tablets by mouth 2 (two) times daily.     . fish oil-omega-3 fatty acids 1000 MG capsule Take 2 g by mouth daily.    . Garlic 123XX123 MG CAPS Take 1,000 mg by mouth daily.    . hydrochlorothiazide (HYDRODIURIL) 25 MG tablet Take 1 tablet (25 mg total) by mouth daily. 90 tablet 3  . Multiple Vitamin (MULTIVITAMIN) capsule Take 1 capsule by mouth daily.      . NON FORMULARY Tumeric    . Probiotic Product (PROBIOTIC DAILY PO) Take by mouth daily.    Marland Kitchen venlafaxine (EFFEXOR) 75 MG tablet Take 1 tablet (75 mg total) by mouth 2 (two) times daily with a meal. (Patient taking differently: Take 75 mg by mouth 2 (two) times daily with a meal. Takes 1 in the  morning and 1/2 in the evening) 180 tablet 3  . VITAMIN D, CHOLECALCIFEROL, PO Take 4,000 Int'l Units by mouth.      No current facility-administered medications for this visit.    Family History  Problem Relation Age of Onset  . Heart attack Brother   . Diabetes Other     granddaughter  . Kidney failure Sister   . Arthritis Father   . Ovarian cancer Mother   . Hypertension Mother     ROS:  Pertinent items are noted in HPI.  Otherwise, a comprehensive ROS was negative.  Exam:   BP 122/78 mmHg  Pulse 78  Resp 16  Ht 5\' 7"  (1.702 m)  Wt 192 lb (87.091 kg)  BMI 30.06 kg/m2  LMP 01/30/1993  Weight change: -6#   Height: 5\' 7"  (170.2 cm)  Ht Readings from Last 3 Encounters:  02/23/15 5\' 7"  (1.702 m)  12/07/14 5\' 7"  (1.702 m)  10/21/14 5\' 7"  (1.702 m)    General appearance: alert, cooperative and appears stated age Head: Normocephalic, without obvious abnormality, atraumatic Neck: no adenopathy, supple, symmetrical, trachea midline and thyroid normal to  inspection and palpation Lungs: clear to auscultation bilaterally Breasts: normal appearance, no masses or tenderness, well healed scars on right breast Heart: regular rate and rhythm Abdomen: soft, non-tender; bowel sounds normal; no masses,  no organomegaly Extremities: extremities normal, atraumatic, no cyanosis or edema Skin: Skin color, texture, turgor normal. No rashes or lesions Lymph nodes: Cervical, supraclavicular, and axillary nodes normal. No abnormal inguinal nodes palpated Neurologic: Grossly normal   Pelvic: External genitalia:  no lesions              Urethra:  normal appearing urethra with no masses, tenderness or lesions              Bartholins and Skenes: normal                 Vagina: normal appearing vagina with normal color and discharge, no lesions              Cervix: no lesions              Pap taken: No. Bimanual Exam:  Uterus:  normal size, contour, position, consistency, mobility, non-tender              Adnexa: normal adnexa and no mass, fullness, tenderness               Rectovaginal: Confirms               Anus:  normal sphincter tone, no lesions  Chaperone was present for exam.  A:  Well Woman with normal exam Postmenopausal, off HRT.  (Took HRT 7/93- 05/2006) H/O Right breast cancer 2008 ER+,PR+, Her 2 neu -, s/p lumpectomy and neg sentinel node biopsy 05/2006 Squamous cell carcinoma, sees derm every six months Osteopenia  P:  Mammogram screening discussed No pap today Labs with Dr. Maudie Mercury.  Vaccines UTD. AEX or follow up prn 1 year

## 2015-02-24 DIAGNOSIS — L821 Other seborrheic keratosis: Secondary | ICD-10-CM | POA: Diagnosis not present

## 2015-02-24 DIAGNOSIS — Z23 Encounter for immunization: Secondary | ICD-10-CM | POA: Diagnosis not present

## 2015-02-24 DIAGNOSIS — L570 Actinic keratosis: Secondary | ICD-10-CM | POA: Diagnosis not present

## 2015-02-24 DIAGNOSIS — L409 Psoriasis, unspecified: Secondary | ICD-10-CM | POA: Diagnosis not present

## 2015-02-24 DIAGNOSIS — Z85828 Personal history of other malignant neoplasm of skin: Secondary | ICD-10-CM | POA: Diagnosis not present

## 2015-02-24 DIAGNOSIS — R69 Illness, unspecified: Secondary | ICD-10-CM | POA: Diagnosis not present

## 2015-02-24 DIAGNOSIS — L814 Other melanin hyperpigmentation: Secondary | ICD-10-CM | POA: Diagnosis not present

## 2015-02-24 DIAGNOSIS — D225 Melanocytic nevi of trunk: Secondary | ICD-10-CM | POA: Diagnosis not present

## 2015-02-24 DIAGNOSIS — D1801 Hemangioma of skin and subcutaneous tissue: Secondary | ICD-10-CM | POA: Diagnosis not present

## 2015-03-13 DIAGNOSIS — R41841 Cognitive communication deficit: Secondary | ICD-10-CM | POA: Diagnosis not present

## 2015-03-15 ENCOUNTER — Ambulatory Visit: Payer: Medicare HMO | Admitting: Family Medicine

## 2015-03-29 ENCOUNTER — Encounter: Payer: Self-pay | Admitting: Family Medicine

## 2015-03-29 ENCOUNTER — Ambulatory Visit (INDEPENDENT_AMBULATORY_CARE_PROVIDER_SITE_OTHER): Payer: Medicare HMO | Admitting: Family Medicine

## 2015-03-29 VITALS — BP 120/76 | HR 88 | Temp 98.3°F | Ht 67.0 in | Wt 188.2 lb

## 2015-03-29 DIAGNOSIS — R69 Illness, unspecified: Secondary | ICD-10-CM | POA: Diagnosis not present

## 2015-03-29 DIAGNOSIS — F329 Major depressive disorder, single episode, unspecified: Secondary | ICD-10-CM | POA: Insufficient documentation

## 2015-03-29 DIAGNOSIS — R739 Hyperglycemia, unspecified: Secondary | ICD-10-CM

## 2015-03-29 DIAGNOSIS — F3342 Major depressive disorder, recurrent, in full remission: Secondary | ICD-10-CM | POA: Diagnosis not present

## 2015-03-29 DIAGNOSIS — F4329 Adjustment disorder with other symptoms: Secondary | ICD-10-CM | POA: Diagnosis not present

## 2015-03-29 DIAGNOSIS — I10 Essential (primary) hypertension: Secondary | ICD-10-CM

## 2015-03-29 LAB — BASIC METABOLIC PANEL
BUN: 16 mg/dL (ref 6–23)
CO2: 32 meq/L (ref 19–32)
Calcium: 9.6 mg/dL (ref 8.4–10.5)
Chloride: 97 mEq/L (ref 96–112)
Creatinine, Ser: 0.79 mg/dL (ref 0.40–1.20)
GFR: 73.58 mL/min (ref >60.00)
GLUCOSE: 88 mg/dL (ref 70–99)
POTASSIUM: 4.7 meq/L (ref 3.5–5.1)
Sodium: 135 mEq/L (ref 135–145)

## 2015-03-29 NOTE — Patient Instructions (Signed)
Before you leave: Schedule your annual Medicare wellness visit in about 3-4 months Labs  Please consider calling Dr. Glennon Hamilton for help with the stress. Hang in there!   We recommend the following healthy lifestyle measures: - eat a healthy whole foods diet consisting of regular small meals composed of vegetables, fruits, beans, nuts, seeds, healthy meats such as white chicken and fish and whole grains.  - avoid sweets, white starchy foods, fried foods, fast food, processed foods, sodas, red meet and other fattening foods.  - get a least 150-300 minutes of aerobic exercise per week.

## 2015-03-29 NOTE — Progress Notes (Signed)
HPI:  Christine Maldonado is a pleasant 82 year old with a past medical history of hypertension, seasonal allergies, depression and diet-controlled prediabetes here for follow-up today. She takes hydrochlorothiazide for her blood pressure and reports she has been doing well without chest pain, trouble breathing or swelling. She takes Effexor for her for her depression and some hot flashes and reports she has been doing well without any worsening depression or thoughts of self-harm. However, she has recently moved into a friend's home, and the mood has been quite stressful for her. She does not do well with disorder and has been stressed about the impacting processed. She has taken  Xanax the past and wonders if this would be helpful. She has not had any panic attacks, psychotic thoughts or worsening depression. Despite the early spring, her allergies have been okay. She saw her gynecologist, Dr. Sabra Heck, recently had her annual exam.  ROS: See pertinent positives and negatives per HPI.  Past Medical History  Diagnosis Date  . History of breast cancer 2008    DCIS/invasive ductal cancer right breast ER+/PR+  . Hypertension   . Anemia   . PUD (peptic ulcer disease) 8/94    Dr Magod-endoscopy  . Breast cyst 12/95    right breast  . Colon polyps   . Osteopenia   . Squamous cell carcinoma ([place]) 6/11 7/11    on leg-removed  . Bleeding 1993    BTB on HRT-endo biopsy proe with focal breakdown  . Bleeding 1995    BTB on HRT-benign endo biopsy  . Piriformis muscle pain   . Plantar fasciitis   . Depression   . Ulcer   . Seasonal allergies   . UTI (urinary tract infection)   . Migraine   . Hot flashes 02/09/2014  . Hyperglycemia 02/09/2014    Prediabetes labs 03/2013 at [facility]     Past Surgical History  Procedure Laterality Date  . Dilation and curettage of uterus  50+ years ago    for retained placenta  . Breast lumpectomy  06/06/2006    right and sentinel node biopsy  . Dilation and curettage of uterus   1986  . Knee arthroscopy  2016    Dr. Novella Olive    Family History  Problem Relation Age of Onset  . Heart attack Brother   . Diabetes Other     granddaughter  . Kidney failure Sister   . Arthritis Father   . Ovarian cancer Mother   . Hypertension Mother     Social History   Social History  . Marital Status: Widowed    Spouse Name: N/A  . Number of Children: N/A  . Years of Education: N/A   Social History Main Topics  . Smoking status: Former Smoker    Quit date: 06/27/1965  . Smokeless tobacco: Never Used  . Alcohol Use: No  . Drug Use: No  . Sexual Activity: No   Other Topics Concern  . 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  Ascorbic Acid (VITAMIN C PO), Take 1,000 mg by mouth daily. , Disp: , Rfl:  .  CALCIUM PO, Take 600 mg by mouth daily. ,  Disp: , Rfl:  .  Coenzyme Q10 (CO Q 10 PO), Take by mouth daily., Disp: , Rfl:  .  CRANBERRY PO, Take 84 mg by mouth daily. , Disp: , Rfl:  .  docusate sodium (COLACE) 100 MG capsule, Take 200 mg by mouth at bedtime. , Disp: , Rfl:  .  Fexofenadine HCl (ALLEGRA PO), Take 0.5 tablets by mouth 2 (two) times daily. , Disp: , Rfl:  .  fish oil-omega-3 fatty acids 1000 MG capsule, Take 1 g by mouth daily. , Disp: , Rfl:  .  Garlic 123XX123 MG CAPS, Take 1,000 mg by mouth daily., Disp: , Rfl:  .  hydrochlorothiazide (HYDRODIURIL) 25 MG tablet, Take 1 tablet (25 mg total) by mouth daily., Disp: 90 tablet, Rfl: 3 .  Multiple Vitamin (MULTIVITAMIN) capsule, Take 1 capsule by mouth daily.  , Disp: , Rfl:  .  NON FORMULARY, Tumeric, Disp: , Rfl:  .  Probiotic Product (PROBIOTIC DAILY PO), Take by mouth daily., Disp: , Rfl:  .  venlafaxine (EFFEXOR) 75 MG tablet, Take 1 tablet (75 mg total) by mouth 2  (two) times daily with a meal. (Patient taking differently: Take 75 mg by mouth 2 (two) times daily with a meal. Takes 1 in the morning and 1/2 in the evening), Disp: 180 tablet, Rfl: 3 .  VITAMIN D, CHOLECALCIFEROL, PO, Take 4,000 Int'l Units by mouth. , Disp: , Rfl:   EXAM:  Filed Vitals:   03/29/15 1108  BP: 120/76  Pulse: 88  Temp: 98.3 F (36.8 C)    Body mass index is 29.47 kg/(m^2).  GENERAL: vitals reviewed and listed above, alert, oriented, appears well hydrated and in no acute distress  HEENT: atraumatic, conjunttiva clear, no obvious abnormalities on inspection of external nose and ears  NECK: no obvious masses on inspection  LUNGS: clear to auscultation bilaterally, no wheezes, rales or rhonchi, good air movement  CV: HRRR, no peripheral edema  MS: moves all extremities without noticeable abnormality  PSYCH: pleasant and cooperative, no obvious depression or anxiety  ASSESSMENT AND PLAN:  Discussed the following assessment and plan:  Essential hypertension, benign - Plan: Basic metabolic panel -BMP -Continue current medication, blood pressure is reasonably controlled despite the stress  Hyperglycemia -BMP  Recurrent major depressive disorder, in full remission (HCC) Stress and adjustment reaction -Continue Effexor -Discussed treatment options and risk for the treatment of stress and anxiety -Advised against benzos to risks months significant panic disorder develops, and advised CBT  -Patient advised to return or notify a doctor immediately if symptoms worsen or persist or new concerns arise.  Patient Instructions  Before you leave: Schedule your annual Medicare wellness visit in about 3-4 months Labs  Please consider calling Dr. Glennon Hamilton for help with the stress. Hang in there!   We recommend the following healthy lifestyle measures: - eat a healthy whole foods diet consisting of regular small meals composed of vegetables, fruits, beans, nuts, seeds,  healthy meats such as white chicken and fish and whole grains.  - avoid sweets, white starchy foods, fried foods, fast food, processed foods, sodas, red meet and other fattening foods.  - get a least 150-300 minutes of aerobic exercise per week.       Colin Benton R.

## 2015-03-29 NOTE — Progress Notes (Signed)
Pre visit review using our clinic review tool, if applicable. No additional management support is needed unless otherwise documented below in the visit note. 

## 2015-04-01 ENCOUNTER — Other Ambulatory Visit: Payer: Self-pay | Admitting: Family Medicine

## 2015-04-01 MED ORDER — HYDROCHLOROTHIAZIDE 25 MG PO TABS: 25.0000 mg | ORAL_TABLET | Freq: Every day | ORAL | Status: DC

## 2015-06-09 DIAGNOSIS — H524 Presbyopia: Secondary | ICD-10-CM | POA: Diagnosis not present

## 2015-06-09 DIAGNOSIS — Z961 Presence of intraocular lens: Secondary | ICD-10-CM | POA: Diagnosis not present

## 2015-06-09 DIAGNOSIS — H26491 Other secondary cataract, right eye: Secondary | ICD-10-CM | POA: Diagnosis not present

## 2015-06-17 ENCOUNTER — Telehealth: Payer: Self-pay | Admitting: Family Medicine

## 2015-06-17 MED ORDER — VENLAFAXINE HCL 75 MG PO TABS: 75.0000 mg | ORAL_TABLET | Freq: Two times a day (BID) | ORAL | Status: DC

## 2015-06-17 NOTE — Telephone Encounter (Signed)
Rx done. 

## 2015-06-17 NOTE — Telephone Encounter (Signed)
Pt request refill of the following:   venlafaxine (EFFEXOR) 75 MG tablet   Phamacy:  [facility]

## 2015-06-21 ENCOUNTER — Other Ambulatory Visit: Payer: Self-pay

## 2015-06-21 MED ORDER — VENLAFAXINE HCL 75 MG PO TABS: 75.0000 mg | ORAL_TABLET | Freq: Two times a day (BID) | ORAL | Status: DC

## 2015-07-12 DIAGNOSIS — M7542 Impingement syndrome of left shoulder: Secondary | ICD-10-CM | POA: Diagnosis not present

## 2015-07-27 ENCOUNTER — Ambulatory Visit (INDEPENDENT_AMBULATORY_CARE_PROVIDER_SITE_OTHER): Payer: Medicare HMO | Admitting: Family Medicine

## 2015-07-27 ENCOUNTER — Encounter: Payer: Self-pay | Admitting: Family Medicine

## 2015-07-27 ENCOUNTER — Ambulatory Visit: Payer: Medicare HMO | Admitting: Family Medicine

## 2015-07-27 VITALS — BP 120/80 | HR 85 | Temp 97.8°F | Ht 67.0 in | Wt 188.2 lb

## 2015-07-27 DIAGNOSIS — Z0000 Encounter for general adult medical examination without abnormal findings: Secondary | ICD-10-CM

## 2015-07-27 DIAGNOSIS — I10 Essential (primary) hypertension: Secondary | ICD-10-CM

## 2015-07-27 DIAGNOSIS — F330 Major depressive disorder, recurrent, mild: Secondary | ICD-10-CM

## 2015-07-27 DIAGNOSIS — E785 Hyperlipidemia, unspecified: Secondary | ICD-10-CM | POA: Diagnosis not present

## 2015-07-27 DIAGNOSIS — R69 Illness, unspecified: Secondary | ICD-10-CM | POA: Diagnosis not present

## 2015-07-27 DIAGNOSIS — R739 Hyperglycemia, unspecified: Secondary | ICD-10-CM

## 2015-07-27 LAB — CBC WITH DIFFERENTIAL/PLATELET
BASOS ABS: 0 10*3/uL (ref 0.0–0.1)
Basophils Relative: 0.5 % (ref 0.0–3.0)
EOS ABS: 0.1 10*3/uL (ref 0.0–0.7)
Eosinophils Relative: 1.8 % (ref 0.0–5.0)
HCT: 41.8 % (ref 36.0–46.0)
Hemoglobin: 13.9 g/dL (ref 12.0–15.0)
LYMPHS ABS: 1.7 10*3/uL (ref 0.7–4.0)
Lymphocytes Relative: 31 % (ref 12.0–46.0)
MCHC: 33.1 g/dL (ref 30.0–36.0)
MCV: 90.9 fl (ref 78.0–100.0)
MONO ABS: 0.8 10*3/uL (ref 0.1–1.0)
Monocytes Relative: 15.1 % — ABNORMAL HIGH (ref 3.0–12.0)
NEUTROS ABS: 2.8 10*3/uL (ref 1.4–7.7)
NEUTROS PCT: 51.6 % (ref 43.0–77.0)
PLATELETS: 264 10*3/uL (ref 150.0–400.0)
RBC: 4.6 Mil/uL (ref 3.87–5.11)
RDW: 13.9 % (ref 11.5–15.5)
WBC: 5.5 10*3/uL (ref 4.0–10.5)

## 2015-07-27 LAB — BASIC METABOLIC PANEL
BUN: 16 mg/dL (ref 6–23)
CO2: 35 meq/L — AB (ref 19–32)
CREATININE: 0.8 mg/dL (ref 0.40–1.20)
Calcium: 9.3 mg/dL (ref 8.4–10.5)
Chloride: 98 mEq/L (ref 96–112)
GFR: 72.46 mL/min (ref >60.00)
GLUCOSE: 86 mg/dL (ref 70–99)
Potassium: 4.6 mEq/L (ref 3.5–5.1)
Sodium: 138 mEq/L (ref 135–145)

## 2015-07-27 LAB — LIPID PANEL
CHOLESTEROL: 239 mg/dL — AB (ref 0–200)
HDL: 87.2 mg/dL (ref >39.00)
LDL CALC: 136 mg/dL — AB (ref 0–99)
NonHDL: 152.23
TRIGLYCERIDES: 82 mg/dL (ref 0.0–149.0)
Total CHOL/HDL Ratio: 3
VLDL: 16.4 mg/dL (ref 0.0–40.0)

## 2015-07-27 LAB — HEMOGLOBIN A1C: Hgb A1c MFr Bld: 6 % (ref 4.6–6.5)

## 2015-07-27 NOTE — Patient Instructions (Signed)
BEFORE YOU LEAVE: -labs -follow up in 4-6 months  Please bring a copy of your advanced directives to our office.  Mammogram in the fall  Bone density not due until next year  We recommend the following healthy lifestyle measures: - eat a healthy whole foods diet consisting of regular small meals composed of vegetables, fruits, beans, nuts, seeds, healthy meats such as white chicken and fish and whole grains.  - avoid sweets, white starchy foods, fried foods, fast food, processed foods, sodas, red meet and other fattening foods.  - get a least 150-300 minutes of aerobic exercise per week.   We have ordered labs or studies at this visit. It can take up to 1-2 weeks for results and processing. IF results require follow up or explanation, we will call you with instructions. Clinically stable results will be released to your [facility]. If you have not heard from [place] or cannot find your results in [facility] in 2 weeks please contact our office at [phone].  If you are not yet signed up for [facility], please consider signing up.

## 2015-07-27 NOTE — Progress Notes (Signed)
Pre visit review using our clinic review tool, if applicable. No additional management support is needed unless otherwise documented below in the visit note. 

## 2015-07-27 NOTE — Progress Notes (Signed)
Medicare Annual Preventive Care Visit  (initial annual wellness or annual wellness exam)  Concerns and/or follow up today:  Christine Maldonado is a pleasant 82 year old with a past medical history of hypertension, seasonal allergies, depression and diet-controlled prediabetes here for follow-up today. She takes hydrochlorothiazide for her blood pressure and reports she has been doing well without chest pain, trouble breathing or swelling. She takes Effexor for her for her depression and some hot flashes and reports she has been doing well without any worsening depression or thoughts of self-harm. She has not had any panic attacks, psychotic thoughts or worsening depression.  She saw her gynecologist, Dr. Sabra Heck, in January and had her annual exam. Will be having surgery on hammer toe with Dr. Latanya Maudlin. Sees a dermatologist for a skin exam.  ROS: negative for report of fevers, unintentional weight loss, vision changes, vision loss, hearing loss or change, chest pain, sob, hemoptysis, melena, hematochezia, hematuria, genital discharge or lesions, falls, bleeding or bruising, loc, thoughts of suicide or self harm, memory loss  1.) Patient-completed health risk assessment  - completed and reviewed, see scanned documentation  2.) Review of Medical History: -PMH, PSH, Family History and current specialty and care providers reviewed and updated and listed below  - see scanned in document in chart and below  Past Medical History  Diagnosis Date  . History of breast cancer 2008    DCIS/invasive ductal cancer right breast ER+/PR+  . Hypertension   . Anemia   . PUD (peptic ulcer disease) 8/94    Dr Magod-endoscopy  . Breast cyst 12/95    right breast  . Colon polyps   . Osteopenia   . Squamous cell carcinoma ([place]) 6/11 7/11    on leg-removed  . Bleeding 1993    BTB on HRT-endo biopsy proe with focal breakdown  . Bleeding 1995    BTB on HRT-benign endo biopsy  . Piriformis muscle pain   . Plantar  fasciitis   . Depression   . Ulcer   . Seasonal allergies   . UTI (urinary tract infection)   . Migraine   . Hot flashes 02/09/2014  . Hyperglycemia 02/09/2014    Pred[facility]abetes labs 03/2013 at [facility]     Past Surgical History  Procedure Laterality Date  . Dilation and curettage of uterus  50+ years ago    for retained placenta  . Breast lumpectomy  06/06/2006    right and sentinel node biopsy  . Dilation and curettage of uterus  1986  . Knee arthroscopy  2016    Dr. Novella Olive    Social History   Social History  . Marital Status: Widowed    Spouse Name: N/A  . Number of Children: N/A  . Years of Education: N/A   Occupational History  . Not on file.   Social History Main Topics  . Smoking status: Former Smoker    Quit date: 06/27/1965  . Smokeless tobacco: Never Used  . Alcohol Use: No  . Drug Use: No  . Sexual Activity: No   Other Topics Concern  . 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  Heart attack Brother   . Diabetes Other     granddaughter  . Kidney failure Sister   . Arthritis Father   . Ovarian cancer Mother   . Hypertension Mother     Current Outpatient Prescriptions on File Prior to Visit  Medication Sig Dispense Refill  . Ascorbic Acid (VITAMIN C PO) Take 1,000 mg by mouth daily.     Marland Kitchen CALCIUM PO Take 600 mg by mouth daily.     . Coenzyme Q10 (CO Q 10 PO) Take by mouth daily.    Marland Kitchen CRANBERRY PO Take 84 mg by mouth daily.     Marland Kitchen docusate sodium (COLACE) 100 MG capsule Take 200 mg by mouth at bedtime.     Marland Kitchen Fexofenadine HCl (ALLEGRA PO) Take 0.5 tablets by mouth 2 (two) times daily.     . fish oil-omega-3 fatty acids 1000 MG capsule Take 1 g by mouth daily.     . Garlic 123XX123 MG CAPS Take 1,000 mg by  mouth daily.    . hydrochlorothiazide (HYDRODIURIL) 25 MG tablet Take 1 tablet (25 mg total) by mouth daily. 90 tablet 3  . Multiple Vitamin (MULTIVITAMIN) capsule Take 1 capsule by mouth daily.      . NON FORMULARY Tumeric    . Probiotic Product (PROBIOTIC DAILY PO) Take by mouth daily.    Marland Kitchen venlafaxine (EFFEXOR) 75 MG tablet Take 1 tablet (75 mg total) by mouth 2 (two) times daily with a meal. 180 tablet 1  . VITAMIN D, CHOLECALCIFEROL, PO Take 4,000 Int'l Units by mouth.      No current facility-administered medications on file prior to visit.     3.) Review of functional ability and level of safety:  Any difficulty hearing?  NO  History of falling?  NO  Any trouble with IADLs - using a phone, using transportation, grocery shopping, preparing meals, doing housework, doing laundry, taking medications and managing money? NO  Advance Directives? YES - she agrees to bring copy  See summary of recommendations in Patient Instructions below.  4.) Physical Exam Filed Vitals:   07/27/15 0835  BP: 120/80  Pulse: 85  Temp: 97.8 F (36.6 C)   Estimated body mass index is 29.47 kg/(m^2) as calculated from the following:   Height as of this encounter: 5\' 7"  (1.702 m).   Weight as of this encounter: 188 lb 3.2 oz (85.367 kg).  EKG (optional): deferred  General: alert, appear well hydrated and in no acute distress  HEENT: visual acuity grossly intact  CV: HRRR  Lungs: CTA bilaterally  Psych: pleasant and cooperative, no obvious depression or anxiety  Cognitive function grossly intact  See patient instructions for recommendations.  Education and counseling regarding the above review of health provided with a plan for the following: -see scanned patient completed form for further details -fall prevention strategies discussed  -healthy lifestyle discussed -importance and resources for completing advanced directives discussed -see patient instructions below for any other  recommendations provided  4)The following written screening schedule of preventive measures were reviewed with assessment and plan made per below, orders and patient instructions:      AAA screening done if applicable     Alcohol screening done     Obesity Screening and counseling done     STI screening (Hep C if born 1943-65) declined     Tobacco Screening done        Pneumococcal (PPSV23 -one dose after 64, one before if risk factors), influenza yearly and hepatitis  B vaccines (if high risk - end stage renal disease, IV drugs, homosexual men, live in home for mentally retarded, hemophilia receiving factors) ASSESSMENT/PLAN: done       Screening mammograph (yearly if >40) ASSESSMENT/PLAN: utd, done 10/2014, sees gyn      Screening Pap smear/pelvic exam (q2 years) ASSESSMENT/PLAN: n/a, declined, sees gyn      Colorectal cancer screening (FOBT yearly or flex sig q4y or colonoscopy q10y or barium enema q4y) ASSESSMENT/PLAN: had colonoscopy in 2014      Diabetes outpatient self-management training services ASSESSMENT/PLAN: utd or done      Bone mass measurements(covered q2y if indicated - estrogen def, osteoporosis, hyperparathyroid, vertebral abnormalities, osteoporosis or steroids) ASSESSMENT/PLAN: utd, done 05/2014, 2 year repeat      Screening for glaucoma(q1y if high risk - diabetes, FH, AA and > 50 or hispanic and > 65) ASSESSMENT/PLAN: utd or advised      Medical nutritional therapy for individuals with diabetes or renal disease ASSESSMENT/PLAN: see orders      Cardiovascular screening blood tests (lipids q5y) ASSESSMENT/PLAN: see orders and labs      Diabetes screening tests ASSESSMENT/PLAN: see orders and labs   7.) Summary:  Medicare annual wellness visit, subsequent -risk factors and conditions per above assessment were discussed and treatment, recommendations and referrals were offered per documentation above and orders and patient instructions.  Hyperglycemia -  Plan: Hemoglobin A1c -lifestyle recs  Essential hypertension, benign - Plan: Basic metabolic panel, CBC with Differential/Platelets -cont current treatment -lifestyle recs -labs  Hyperlipemia - Plan: Lipid Panel -labs, lifestyle recs  MDD (major depressive disorder), recurrent episode, mild (HCC) -stable, cont current tx  Patient Instructions  BEFORE YOU LEAVE: -labs -follow up in 4-6 months  Please bring a copy of your advanced directives to our office.  Mammogram in the fall  Bone density not due until next year  We recommend the following healthy lifestyle measures: - eat a healthy whole foods diet consisting of regular small meals composed of vegetables, fruits, beans, nuts, seeds, healthy meats such as white chicken and fish and whole grains.  - avoid sweets, white starchy foods, fried foods, fast food, processed foods, sodas, red meet and other fattening foods.  - get a least 150-300 minutes of aerobic exercise per week.   We have ordered labs or studies at this visit. It can take up to 1-2 weeks for results and processing. IF results require follow up or explanation, we will call you with instructions. Clinically stable results will be released to your [facility]. If you have not heard from [place] or cannot find your results in [facility] in 2 weeks please contact our office at [phone].  If you are not yet signed up for [facility], please consider signing up.

## 2015-07-28 ENCOUNTER — Telehealth: Payer: Self-pay | Admitting: Family Medicine

## 2015-07-28 NOTE — Telephone Encounter (Signed)
See result note.  

## 2015-07-28 NOTE — Telephone Encounter (Signed)
Left message for patient to return phone call.  

## 2015-07-28 NOTE — Telephone Encounter (Signed)
Pt is returning Christine Maldonado call concerning blood work results ?

## 2015-08-12 DIAGNOSIS — M2042 Other hammer toe(s) (acquired), left foot: Secondary | ICD-10-CM | POA: Diagnosis not present

## 2015-08-12 DIAGNOSIS — G8918 Other acute postprocedural pain: Secondary | ICD-10-CM | POA: Diagnosis not present

## 2015-08-23 DIAGNOSIS — M2042 Other hammer toe(s) (acquired), left foot: Secondary | ICD-10-CM | POA: Diagnosis not present

## 2015-09-08 DIAGNOSIS — M2042 Other hammer toe(s) (acquired), left foot: Secondary | ICD-10-CM | POA: Diagnosis not present

## 2015-10-06 DIAGNOSIS — Z9889 Other specified postprocedural states: Secondary | ICD-10-CM | POA: Diagnosis not present

## 2015-10-14 ENCOUNTER — Other Ambulatory Visit: Payer: Self-pay | Admitting: Family Medicine

## 2015-10-14 DIAGNOSIS — Z1231 Encounter for screening mammogram for malignant neoplasm of breast: Secondary | ICD-10-CM

## 2015-10-21 ENCOUNTER — Ambulatory Visit
Admission: RE | Admit: 2015-10-21 | Discharge: 2015-10-21 | Disposition: A | Discharge status: Home / Self Care | Payer: Medicare HMO | Source: Ambulatory Visit | Attending: Family Medicine | Admitting: Family Medicine

## 2015-10-21 DIAGNOSIS — Z1231 Encounter for screening mammogram for malignant neoplasm of breast: Secondary | ICD-10-CM | POA: Diagnosis not present

## 2015-11-08 ENCOUNTER — Telehealth: Payer: Self-pay

## 2015-11-08 NOTE — Telephone Encounter (Signed)
I called her and left a voicemail asking her to call to ensure she was followed up on and had treatment.   Patient Name: Christine Maldonado Gender: Female DOB: 19-Dec-1928 Age: 82 Y 2 M 25 D Return Phone Number: [phone] (Primary) Address: City/State/Zip: [place] Client [place] - Client Client Site [place] - Night Physician Colin Benton- MD Contact Type Call Who Is Calling Patient / Member / Family / Caregiver Call Type Triage / Clinical Relationship To Patient Self Return Phone Number [phone] (Primary) Chief Complaint Urination Pain Reason for Call Symptomatic / Request for [place] states having UTI symptoms, asking forabx, bad pain when urinating PreDisposition Did not know what to do Translation No Nurse Assessment Nurse: Luther Parody, RN, Malachy Mood Date/Time (Eastern Time): 11/06/2015 12:24:06 PM Confirm and document reason for call. If symptomatic, describe symptoms. You must click the next button to save text entered. ---Caller states that she began having pain with urination and  abx, bad pain when urinating PreDisposition Did not know what to do Translation No Nurse Assessment Nurse: Luther Parody, RN, Malachy Mood Date/Time (Eastern Time): 11/06/2015 12:24:06 PM Confirm and document reason for call. If symptomatic, describe symptoms. You must click the next button to save text entered. ---Caller states that she began having pain with urination and abx, bad pain when urinating PreDisposition Did not know what to do Translation No Nurse Assessment Nurse: Luther Parody, RN, Malachy Mood Date/Time (Eastern Time): 11/06/2015 12:24:06 PM Confirm and document reason for call. If symptomatic, describe symptoms. You must click the next button to save text entered. ---Caller states that she began having pain with urination and frequency this am. Denies fever. Has the patient traveled out of the country within the last  30 days? ---Not Applicable Does the patient have any new or worsening symptoms? ---Yes Will a triage be completed? ---Yes Related visit to physician within the last 2 weeks? ---No Does the PT have any chronic conditions? (i.e. diabetes, asthma, etc.) ---Yes List chronic conditions. ---depression, htn Is this a behavioral health or substance abuse call? ---No Guidelines Guideline Title Affirmed Question Affirmed Notes Nurse Date/Time (Eastern Time) Urination Pain - Female [1] SEVERE pain with urination (e.g., excruciating) AND [2] not improved after 2 hours of pain medicine and Sitz bath Luther Parody, RN, Malachy Mood 11/06/2015 12:25:59 PM Disp. Time Eilene Ghazi Time) Disposition Final User 11/06/2015 12:32:19 PM See Physician within 4 Hours (or PCP triage) Yes Luther Parody, RN, Erskine Speed Understands: Yes Disagree/Comply: Comply Care Advice Given Per Guideline SEE PHYSICIAN WITHIN 4 HOURS (or PCP triage): CARE ADVICE given per Urination Pain - Female (Adult) guideline. * You become worse. CALL BACK IF: Referrals GO TO FACILITY UNDECIDED

## 2015-11-09 ENCOUNTER — Other Ambulatory Visit (HOSPITAL_COMMUNITY)
Admission: RE | Admit: 2015-11-09 | Discharge: 2015-11-09 | Disposition: A | Discharge status: Home / Self Care | Payer: Medicare HMO | Source: Ambulatory Visit | Attending: Family Medicine | Admitting: Family Medicine

## 2015-11-09 ENCOUNTER — Ambulatory Visit (INDEPENDENT_AMBULATORY_CARE_PROVIDER_SITE_OTHER): Payer: Medicare HMO | Admitting: Family Medicine

## 2015-11-09 ENCOUNTER — Encounter: Payer: Self-pay | Admitting: Family Medicine

## 2015-11-09 VITALS — BP 124/70 | HR 91 | Temp 98.1°F | Ht 67.0 in

## 2015-11-09 DIAGNOSIS — N760 Acute vaginitis: Secondary | ICD-10-CM | POA: Diagnosis not present

## 2015-11-09 DIAGNOSIS — Z23 Encounter for immunization: Secondary | ICD-10-CM

## 2015-11-09 DIAGNOSIS — I10 Essential (primary) hypertension: Secondary | ICD-10-CM

## 2015-11-09 DIAGNOSIS — R300 Dysuria: Secondary | ICD-10-CM | POA: Diagnosis not present

## 2015-11-09 DIAGNOSIS — N898 Other specified noninflammatory disorders of vagina: Secondary | ICD-10-CM

## 2015-11-09 LAB — POCT URINALYSIS DIPSTICK
Bilirubin, UA: NEGATIVE
Glucose, UA: NEGATIVE
Ketones, UA: NEGATIVE
NITRITE UA: NEGATIVE
PROTEIN UA: NEGATIVE
Spec Grav, UA: <=1.005
Urobilinogen, UA: 0.2
pH, UA: 5

## 2015-11-09 NOTE — Addendum Note (Signed)
Addended by: Agnes Lawrence on: 11/09/2015 04:56 PM   Modules accepted: Orders

## 2015-11-09 NOTE — Progress Notes (Signed)
HPI:  Christine Maldonado is a pleasant 82 year old here for an acute visit for dysuria. She had mild bladder discomfort, urgency and dysuria 3 days ago. She drank a lot of water and the symptoms resolved. Today she has some mild vulvovaginal itching. Denies any other symptoms today. Denies fever, malaise, flank pain, abdominal or pelvic pain, hematuria, nausea, vomiting or constipation. She also has questions about her blood pressure. Her blood pressure is well controlled on hydrochlorothiazide which she has taken for some time. She has no side effects. However, she has read information about hydrochlorothiazide and wonders if she can just stop this medicine since her blood pressure is good. Denies chest pain, shortness of breath, low blood pressure, dizziness, headaches or swelling.  ROS: See pertinent positives and negatives per HPI.  Past Medical History:  Diagnosis Date  . Anemia   . Bleeding 1993   BTB on HRT-endo biopsy proe with focal breakdown  . Bleeding 1995   BTB on HRT-benign endo biopsy  . Breast cyst 12/95   right breast  . Colon polyps   . Depression   . History of breast cancer 2008   DCIS/invasive ductal cancer right breast ER+/PR+  . Hot flashes 02/09/2014  . Hyperglycemia 02/09/2014   Prediabetes labs 03/2013 at [facility]   . Hypertension   . Migraine   . Osteopenia   . Piriformis muscle pain   . Plantar fasciitis   . PUD (peptic ulcer disease) 8/94   Dr Magod-endoscopy  . Seasonal allergies   . Squamous cell carcinoma 6/11 7/11   on leg-removed  . Ulcer ([place])   . UTI (urinary tract infection)     Past Surgical History:  Procedure Laterality Date  . BREAST LUMPECTOMY  06/06/2006   right and sentinel node biopsy  . DILATION AND CURETTAGE OF UTERUS  50+ years ago   for retained placenta  . DILATION AND CURETTAGE OF UTERUS  1986  . KNEE ARTHROSCOPY  2016   Dr. Novella Olive    Family History  Problem Relation Age of Onset  . Heart attack Brother   . Diabetes Other      granddaughter  . Kidney failure Sister   . Arthritis Father   . Ovarian cancer Mother   . Hypertension Mother     Social History   Social History  . Marital status: Widowed    Spouse name: N/A  . Number of children: N/A  . Years of education: N/A   Social History Main Topics  . Smoking status: Former Smoker    Quit date: 06/27/1965  . Smokeless tobacco: Never Used  . Alcohol use No  . Drug use: No  . Sexual activity: No   Other Topics Concern  . 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  Ascorbic Acid (VITAMIN C PO), Take 1,000 mg by mouth daily. , Disp: , Rfl:  .  CALCIUM PO, Take 600 mg by mouth daily. , Disp: , Rfl:  .  Coenzyme Q10 (CO Q 10 PO), Take by mouth daily., Disp: , Rfl:  .  CRANBERRY PO, Take 84 mg by mouth daily. , Disp: , Rfl:  .  docusate sodium (COLACE) 100 MG capsule, Take 200 mg by mouth at bedtime. , Disp: , Rfl:  .  Fexofenadine HCl (ALLEGRA PO), Take 0.5 tablets by mouth 2 (two) times daily. , Disp: , Rfl:  .  fish oil-omega-3 fatty acids 1000 MG capsule, Take 1 g by mouth daily. , Disp: , Rfl:  .  Garlic 123XX123 MG CAPS, Take 1,000 mg by mouth daily., Disp: , Rfl:  .  hydrochlorothiazide (HYDRODIURIL) 25 MG tablet, Take 1 tablet (25 mg total) by mouth daily., Disp: 90 tablet, Rfl: 3 .  Multiple Vitamin (MULTIVITAMIN) capsule, Take 1 capsule by mouth daily.  , Disp: , Rfl:  .  NON FORMULARY, Tumeric, Disp: , Rfl:  .  Probiotic Product (PROBIOTIC DAILY PO), Take by mouth daily., Disp: , Rfl:  .  venlafaxine (EFFEXOR) 75 MG tablet, Take 1 tablet (75 mg total) by mouth 2 (two) times daily with a meal., Disp: 180 tablet, Rfl: 1 .  VITAMIN D, CHOLECALCIFEROL, PO, Take 4,000 Int'l Units by mouth. , Disp: , Rfl:    EXAM:  Vitals:   11/09/15 1626  BP: 124/70  Pulse: 91  Temp: 98.1 F (36.7 C)    There is no height or weight on file to calculate BMI.  GENERAL: vitals reviewed and listed above, alert, oriented, appears well hydrated and in no acute distress  HEENT: atraumatic, conjunttiva clear, no obvious abnormalities on inspection of external nose and ears  NECK: no obvious masses on inspection  LUNGS: clear to auscultation bilaterally, no wheezes, rales or rhonchi, good air movement  CV: HRRR, no peripheral edema  ABD: soft, NTTP, no CVA TTP  GU: mildly erythematous irritated labial skin, no sig discharge, speculum exam not performed.  MS: moves all extremities without noticeable abnormality  PSYCH: pleasant and cooperative, no obvious depression or anxiety  ASSESSMENT AND PLAN:  Discussed the following assessment and plan:  Vaginal irritation - Plan: Cervicovaginal ancillary only -Likely yeast -Yeast and BV testing pending -Advised topical over-the-counter yeast treatment in the interim and follow-up as needed if symptoms persist or worsen or if new concerns arise  Dysuria -Resolved we will check UA and culture if needed - suspect there will be some abnormalities due to skin irritation   Essential hypertension, benign -Discussed risk and benefits of blood pressure treatments  -Opted to continue current medication for now   -Patient advised to return or notify a doctor immediately if symptoms worsen or persist or new concerns arise.  Patient Instructions  BEFORE YOU LEAVE: -udip - reflex culture if needed  We have ordered labs or studies at this visit. It can take up to 1-2 weeks for results and processing. IF results require follow up or explanation, we will call you with instructions. Clinically stable results will be released to your [facility]. If you have not heard from [place] or cannot find your results in [facility] in 2 weeks please contact our office at [phone].   If you are not yet signed up for [facility], please consider signing up.  Please try a topical over the counter yeast treatment such as monistat or clotrimazole while waiting on test results.  Follow up if symptoms worsen, persist or new concern arise and for you regular follow up.           Colin Benton R., DO

## 2015-11-09 NOTE — Addendum Note (Signed)
Addended by: Agnes Lawrence on: 11/09/2015 05:01 PM   Modules accepted: Orders

## 2015-11-09 NOTE — Progress Notes (Signed)
Pre visit review using our clinic review tool, if applicable. No additional management support is needed unless otherwise documented below in the visit note. Patient declined weight measurement today. 

## 2015-11-09 NOTE — Telephone Encounter (Signed)
Patient called back today and I offered her an appt for today and she declined as she is feeling better.  States this started on Saturday morning and a friend of hers told her to drink more water and it helped and she was concerned as she could not reach any of her doctors-not [place] or her OB/GYN.  I advised the pt in the future to try an urgent care center as they could see her and treat the problem.

## 2015-11-09 NOTE — Patient Instructions (Signed)
BEFORE YOU LEAVE: -udip - reflex culture if needed  We have ordered labs or studies at this visit. It can take up to 1-2 weeks for results and processing. IF results require follow up or explanation, we will call you with instructions. Clinically stable results will be released to your [facility]. If you have not heard from [place] or cannot find your results in [facility] in 2 weeks please contact our office at [phone].  If you are not yet signed up for [facility], please consider signing up.  Please try a topical over the counter yeast treatment such as monistat or clotrimazole while waiting on test results.  Follow up if symptoms worsen, persist or new concern arise and for you regular follow up.

## 2015-11-11 LAB — URINE CULTURE

## 2015-11-12 ENCOUNTER — Telehealth: Payer: Self-pay | Admitting: Family Medicine

## 2015-11-12 ENCOUNTER — Other Ambulatory Visit: Payer: Self-pay | Admitting: Emergency Medicine

## 2015-11-12 MED ORDER — NITROFURANTOIN MONOHYD MACRO 100 MG PO CAPS: 100.0000 mg | ORAL_CAPSULE | Freq: Two times a day (BID) | ORAL | 0 refills | Status: DC

## 2015-11-12 NOTE — Telephone Encounter (Signed)
Called and spoke with pt informing her of results. Pt verbalized understanding.

## 2015-11-12 NOTE — Telephone Encounter (Signed)
[place] pt is returning your call.

## 2015-11-15 LAB — CERVICOVAGINAL ANCILLARY ONLY
BACTERIAL VAGINITIS: NEGATIVE
Candida vaginitis: NEGATIVE

## 2015-11-17 ENCOUNTER — Telehealth: Payer: Self-pay
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
## 2015-11-17 NOTE — Telephone Encounter (Signed)
Call to Christine Maldonado to cancel her apt for AWV tomorrow as she had one in June 2017. Stated that she has broken out in "red dots" all over. Was taking nitrofurantoin for UTI  Is not sick; just rash; made apt for DR. Kim to [employer].

## 2015-11-18 ENCOUNTER — Ambulatory Visit (INDEPENDENT_AMBULATORY_CARE_PROVIDER_SITE_OTHER): Payer: Medicare HMO | Admitting: Family Medicine

## 2015-11-18 ENCOUNTER — Ambulatory Visit: Payer: Medicare HMO

## 2015-11-18 VITALS — BP 122/80 | HR 88 | Resp 16

## 2015-11-18 DIAGNOSIS — L270 Generalized skin eruption due to drugs and medicaments taken internally: Secondary | ICD-10-CM

## 2015-11-18 NOTE — Progress Notes (Signed)
HPI:  Christine Maldonado is pleasant 44 here for an acute visit for rash. She developed a mildly itchy rash on both legs after starting Macrobid. She feels is related to the medication. She has taken 6/7 days and urinary symptoms are completely resolved. Denies: dysuria, malaise, fevers, insect or tick bites, sob, facial or throat swelling or GI complaints.   ROS: See pertinent positives and negatives per HPI.  Past Medical History:  Diagnosis Date  . Anemia   . Bleeding 1993   BTB on HRT-endo biopsy proe with focal breakdown  . Bleeding 1995   BTB on HRT-benign endo biopsy  . Breast cyst 12/95   right breast  . Colon polyps   . Depression   . History of breast cancer 2008   DCIS/invasive ductal cancer right breast ER+/PR+  . Hot flashes 02/09/2014  . Hyperglycemia 02/09/2014   Prediabetes labs 03/2013 at [facility]   . Hypertension   . Migraine   . Osteopenia   . Piriformis muscle pain   . Plantar fasciitis   . PUD (peptic ulcer disease) 8/94   Dr Magod-endoscopy  . Seasonal allergies   . Squamous cell carcinoma 6/11 7/11   on leg-removed  . Ulcer ([place])   . UTI (urinary tract infection)     Past Surgical History:  Procedure Laterality Date  . BREAST LUMPECTOMY  06/06/2006   right and sentinel node biopsy  . DILATION AND CURETTAGE OF UTERUS  50+ years ago   for retained placenta  . DILATION AND CURETTAGE OF UTERUS  1986  . KNEE ARTHROSCOPY  2016   Dr. Novella Olive    Family History  Problem Relation Age of Onset  . Ovarian cancer Mother   . Hypertension Mother   . Arthritis Father   . Heart attack Brother   . Diabetes Other     granddaughter  . Kidney failure Sister     Social History   Social History  . Marital status: Widowed    Spouse name: N/A  . Number of children: N/A  . Years of education: N/A   Social History Main Topics  . Smoking status: Former Smoker    Quit date: 06/27/1965  . Smokeless tobacco: Never Used  . Alcohol use No  . Drug use: No  .  Sexual activity: No   Other Topics Concern  . 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  Ascorbic Acid (VITAMIN C PO), Take 1,000 mg by mouth daily. , Disp: , Rfl:  .  CALCIUM PO, Take 600 mg by mouth daily. , Disp: , Rfl:  .  Coenzyme Q10 (CO Q 10 PO), Take by mouth daily., Disp: , Rfl:  .  CRANBERRY PO, Take 84 mg by mouth daily. , Disp: , Rfl:  .  docusate sodium (COLACE) 100 MG capsule, Take 200 mg by mouth at bedtime. , Disp: , Rfl:  .  Fexofenadine HCl (ALLEGRA PO), Take 0.5 tablets by mouth 2 (two) times daily. , Disp: , Rfl:  .  fish oil-omega-3 fatty acids 1000 MG capsule, Take 1 g by mouth daily. , Disp: , Rfl:  .  Garlic 123XX123 MG CAPS, Take 1,000 mg by mouth daily., Disp: , Rfl:  .  hydrochlorothiazide (HYDRODIURIL) 25 MG tablet, Take 1 tablet (25 mg total) by mouth daily., Disp: 90 tablet, Rfl: 3 .  Multiple Vitamin (MULTIVITAMIN) capsule, Take 1 capsule by mouth daily.  , Disp: , Rfl:  .  nitrofurantoin, macrocrystal-monohydrate, (MACROBID) 100 MG capsule, Take 1 capsule (100 mg total) by mouth 2 (two) times daily., Disp: 14 capsule, Rfl: 0 .  NON FORMULARY, Tumeric, Disp: , Rfl:  .  Probiotic Product (PROBIOTIC DAILY PO), Take by mouth daily., Disp: , Rfl:  .  venlafaxine (EFFEXOR) 75 MG tablet, Take 1 tablet (75 mg total) by mouth 2 (two) times daily with a meal., Disp: 180 tablet, Rfl: 1 .  VITAMIN D, CHOLECALCIFEROL, PO, Take 4,000 Int'l Units by mouth. , Disp: , Rfl:   EXAM:  Vitals:   11/18/15 1014  BP: 122/80  Pulse: 88  Resp: 16    There is no height or weight on file to calculate BMI.  GENERAL: vitals reviewed and listed above, alert, oriented, appears well hydrated and in no acute distress  HEENT: atraumatic,  conjunttiva clear, no obvious abnormalities on inspection of external nose and ears  NECK: no obvious masses on inspection  LUNGS: clear to auscultation bilaterally, no wheezes, rales or rhonchi, good air movement  CV: HRRR, no peripheral edema  MS: moves all extremities without noticeable abnormality  SKIN: erythematous maculo papular rash mostly on the bilat lower lower extremities, ? Mild rash on trunk  PSYCH: pleasant and cooperative, no obvious depression or anxiety  ASSESSMENT AND PLAN:  Discussed the following assessment and plan:  Drug eruption  -we discussed possible serious and likely etiologies, workup and treatment, treatment risks and return precautions - presumed mild drug rash -allergy list updated -after this discussion, Christine Maldonado opted for stopping macrobid an dmonitoring -of course, we advised Christine Maldonado  to return or notify a doctor immediately if symptoms worsen or persist or new concerns arise.   Patient Instructions  STOP the Macrobid.  Follow up if worsening or persistent rash or new concerns arise.   Christine Maldonado R., DO

## 2015-11-18 NOTE — Patient Instructions (Addendum)
STOP the Macrobid.  Follow up if worsening or persistent rash or new concerns arise.

## 2015-12-20 ENCOUNTER — Telehealth: Payer: Self-pay | Admitting: Family Medicine

## 2015-12-20 MED ORDER — VENLAFAXINE HCL 75 MG PO TABS: 75.0000 mg | ORAL_TABLET | Freq: Two times a day (BID) | ORAL | 1 refills | Status: DC

## 2015-12-20 NOTE — Telephone Encounter (Signed)
Rx done. 

## 2015-12-20 NOTE — Telephone Encounter (Signed)
° ° °  Pt request refill of the following:   venlafaxine (EFFEXOR) 75 MG tablet   Phamacy:   [facility]

## 2016-01-03 DIAGNOSIS — I10 Essential (primary) hypertension: Secondary | ICD-10-CM | POA: Diagnosis not present

## 2016-01-03 DIAGNOSIS — Z6831 Body mass index (BMI) 31.0-31.9, adult: Secondary | ICD-10-CM | POA: Diagnosis not present

## 2016-01-03 DIAGNOSIS — Z0000 Encounter for general adult medical examination without abnormal findings: Secondary | ICD-10-CM | POA: Diagnosis not present

## 2016-01-10 NOTE — Progress Notes (Signed)
HPI:  Had AWV 07/2015 PMH significant for HTN, Depression, Prediabetes, hot flashes, Breast Ca, seasonal allergies here for follow up. Last check of her cholesterol and diabetes labs were rising and lifestyle changes were advised. Due for recheck today. Reports she has not made lifestyle changes, enjoys fried chicken and dinner at her new appartment and is not getting regular exercise. She does try to include veggies at every meal and does do a fair amount of walking. Denies CP, SOB, depression. Seeing dermatologist for seb derm of the scalp and frustrated. Wonders about shampoos.  ROS: See pertinent positives and negatives per HPI.  Past Medical History:  Diagnosis Date  . Anemia   . Bleeding 1993   BTB on HRT-endo biopsy proe with focal breakdown  . Bleeding 1995   BTB on HRT-benign endo biopsy  . Breast cyst 12/95   right breast  . Colon polyps   . Depression   . History of breast cancer 2008   DCIS/invasive ductal cancer right breast ER+/PR+  . Hot flashes 02/09/2014  . Hyperglycemia 02/09/2014   Prediabetes labs 03/2013 at [facility]   . Hypertension   . Migraine   . Osteopenia   . Piriformis muscle pain   . Plantar fasciitis   . PUD (peptic ulcer disease) 8/94   Dr Magod-endoscopy  . Seasonal allergies   . Squamous cell carcinoma 6/11 7/11   on leg-removed  . Ulcer ([place])   . UTI (urinary tract infection)     Past Surgical History:  Procedure Laterality Date  . BREAST LUMPECTOMY  06/06/2006   right and sentinel node biopsy  . DILATION AND CURETTAGE OF UTERUS  50+ years ago   for retained placenta  . DILATION AND CURETTAGE OF UTERUS  1986  . KNEE ARTHROSCOPY  2016   Dr. Novella Olive    Family History  Problem Relation Age of Onset  . Ovarian cancer Mother   . Hypertension Mother   . Arthritis Father   . Heart attack Brother   . Diabetes Other     granddaughter  . Kidney failure Sister     Social History   Social History  . Marital status: Widowed    Spouse name:  N/A  . Number of children: N/A  . Years of education: N/A   Social History Main Topics  . Smoking status: Former Smoker    Quit date: 06/27/1965  . Smokeless tobacco: Never Used  . Alcohol use No  . Drug use: No  . Sexual activity: No   Other Topics Concern  . 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  Ascorbic Acid (VITAMIN C PO), Take 1,000 mg by mouth daily. , Disp: , Rfl:  .  CALCIUM PO, Take 600 mg by mouth daily. , Disp: , Rfl:  .  Coenzyme Q10 (CO Q 10 PO), Take by mouth daily., Disp: , Rfl:  .  CRANBERRY PO, Take 84 mg by mouth daily. , Disp: , Rfl:  .  docusate sodium (COLACE) 100 MG capsule, Take 200 mg by mouth at bedtime. , Disp: , Rfl:  .  Fexofenadine HCl (ALLEGRA PO), Take 0.5 tablets by  mouth 2 (two) times daily. , Disp: , Rfl:  .  fish oil-omega-3 fatty acids 1000 MG capsule, Take 1 g by mouth daily. , Disp: , Rfl:  .  Garlic 123XX123 MG CAPS, Take 1,000 mg by mouth daily., Disp: , Rfl:  .  hydrochlorothiazide (HYDRODIURIL) 25 MG tablet, Take 1 tablet (25 mg total) by mouth daily., Disp: 90 tablet, Rfl: 3 .  Multiple Vitamin (MULTIVITAMIN) capsule, Take 1 capsule by mouth daily.  , Disp: , Rfl:  .  NON FORMULARY, Tumeric, Disp: , Rfl:  .  Probiotic Product (PROBIOTIC DAILY PO), Take by mouth daily., Disp: , Rfl:  .  venlafaxine (EFFEXOR) 75 MG tablet, Take 1 tablet (75 mg total) by mouth 2 (two) times daily with a meal., Disp: 180 tablet, Rfl: 1 .  VITAMIN D, CHOLECALCIFEROL, PO, Take 4,000 Int'l Units by mouth. , Disp: , Rfl:   EXAM:  Vitals:   01/11/16 0901  BP: 120/74  Pulse: 76  Temp: 97.8 F (36.6 C)    Body mass index is 31.36 kg/m.  GENERAL: vitals reviewed and listed above, alert, oriented, appears well hydrated  and in no acute distress  HEENT: atraumatic, conjunttiva clear, no obvious abnormalities on inspection of external nose and ears  NECK: no obvious masses on inspection  LUNGS: clear to auscultation bilaterally, no wheezes, rales or rhonchi, good air movement  CV: HRRR, no peripheral edema  MS: moves all extremities without noticeable abnormality  PSYCH: pleasant and cooperative, no obvious depression or anxiety  ASSESSMENT AND PLAN:  Discussed the following assessment and plan:  Essential hypertension, benign - Plan: Basic metabolic panel, CBC (no diff)  Recurrent major depressive disorder, in full remission (HCC)  Hyperglycemia - Plan: Hemoglobin A1c  Hyperlipidemia, unspecified hyperlipidemia type - Plan: Lipid Panel  -labs -lifestyle recs  - discussed weighing risks/benefits lifestyle with health risks and her short term and long term prioities -discussed tx options for seb derm - she is seeing derm and will follow up with them, may try shampoo listed below -Patient advised to return or notify a doctor immediately if symptoms worsen or persist or new concerns arise.  Patient Instructions  BEFORE YOU LEAVE: -follow up: 1) lab appointment later this week or next week 2) follow up with Dr. Maudie Mercury in 3-4 months  Have a Merry Christmas!  Shea Moisture Dandruff Shampoo and Conditioner may help the scalp.  We have ordered labs or studies at this visit. It can take up to 1-2 weeks for results and processing. IF results require follow up or explanation, we will call you with instructions. Clinically stable results will be released to your [facility]. If you have not heard from [place] or cannot find your results in [facility] in 2 weeks please contact our office at [phone].  If you are not yet signed up for [facility], please consider signing up.   We recommend the following healthy lifestyle for LIFE: 1) Small portions.   Tip: eat off of a salad plate instead of a dinner plate.  Tip:  It is ok to feel hungry after a meal of proper portion sizes  Tip: if you need more or a snack choose fruits, veggies and/or a handful of nuts or seeds.  2) Eat a healthy clean diet.  * Tip: Avoid (less then 1 serving per week): processed foods, sweets, sweetened drinks, white starches (rice, flour, bread, potatoes, pasta, etc), red meat, fast foods, butter  *Tip: CHOOSE instead   * 5-9 servings per day of  fresh or frozen fruits and vegetables (but not corn, potatoes, bananas, canned or dried fruit)   *nuts and seeds, beans   *olives and olive oil   *small portions of lean meats such as fish and white chicken    *small portions of whole grains  3)Get at least 150 minutes of sweaty aerobic exercise per week.  4)Reduce stress - consider counseling, meditation and relaxation to balance other aspects of your life.            Colin Benton R., DO

## 2016-01-11 ENCOUNTER — Encounter: Payer: Self-pay | Admitting: Family Medicine

## 2016-01-11 ENCOUNTER — Ambulatory Visit (INDEPENDENT_AMBULATORY_CARE_PROVIDER_SITE_OTHER): Payer: Medicare HMO | Admitting: Family Medicine

## 2016-01-11 VITALS — BP 120/74 | HR 76 | Temp 97.8°F | Ht 67.0 in | Wt 200.2 lb

## 2016-01-11 DIAGNOSIS — R739 Hyperglycemia, unspecified: Secondary | ICD-10-CM

## 2016-01-11 DIAGNOSIS — F3342 Major depressive disorder, recurrent, in full remission: Secondary | ICD-10-CM | POA: Diagnosis not present

## 2016-01-11 DIAGNOSIS — R69 Illness, unspecified: Secondary | ICD-10-CM | POA: Diagnosis not present

## 2016-01-11 DIAGNOSIS — I10 Essential (primary) hypertension: Secondary | ICD-10-CM | POA: Diagnosis not present

## 2016-01-11 DIAGNOSIS — E785 Hyperlipidemia, unspecified: Secondary | ICD-10-CM | POA: Diagnosis not present

## 2016-01-11 NOTE — Progress Notes (Signed)
Pre visit review using our clinic review tool, if applicable. No additional management support is needed unless otherwise documented below in the visit note. 

## 2016-01-11 NOTE — Patient Instructions (Addendum)
BEFORE YOU LEAVE: -follow up: 1) lab appointment later this week or next week 2) follow up with Dr. Maudie Mercury in 3-4 months  Have a Merry Christmas!  Shea Moisture Dandruff Shampoo and Conditioner may help the scalp.  We have ordered labs or studies at this visit. It can take up to 1-2 weeks for results and processing. IF results require follow up or explanation, we will call you with instructions. Clinically stable results will be released to your [facility]. If you have not heard from [place] or cannot find your results in [facility] in 2 weeks please contact our office at [phone].  If you are not yet signed up for [facility], please consider signing up.   We recommend the following healthy lifestyle for LIFE: 1) Small portions.   Tip: eat off of a salad plate instead of a dinner plate.  Tip: It is ok to feel hungry after a meal of proper portion sizes  Tip: if you need more or a snack choose fruits, veggies and/or a handful of nuts or seeds.  2) Eat a healthy clean diet.  * Tip: Avoid (less then 1 serving per week): processed foods, sweets, sweetened drinks, white starches (rice, flour, bread, potatoes, pasta, etc), red meat, fast foods, butter  *Tip: CHOOSE instead   * 5-9 servings per day of fresh or frozen fruits and vegetables (but not corn, potatoes, bananas, canned or dried fruit)   *nuts and seeds, beans   *olives and olive oil   *small portions of lean meats such as fish and white chicken    *small portions of whole grains  3)Get at least 150 minutes of sweaty aerobic exercise per week.  4)Reduce stress - consider counseling, meditation and relaxation to balance other aspects of your life.

## 2016-01-12 ENCOUNTER — Other Ambulatory Visit (INDEPENDENT_AMBULATORY_CARE_PROVIDER_SITE_OTHER): Payer: Medicare HMO

## 2016-01-12 DIAGNOSIS — R739 Hyperglycemia, unspecified: Secondary | ICD-10-CM | POA: Diagnosis not present

## 2016-01-12 DIAGNOSIS — I10 Essential (primary) hypertension: Secondary | ICD-10-CM

## 2016-01-12 DIAGNOSIS — E785 Hyperlipidemia, unspecified: Secondary | ICD-10-CM | POA: Diagnosis not present

## 2016-01-12 LAB — BASIC METABOLIC PANEL
BUN: 21 mg/dL (ref 6–23)
CALCIUM: 8.7 mg/dL (ref 8.4–10.5)
CO2: 33 meq/L — AB (ref 19–32)
CREATININE: 0.84 mg/dL (ref 0.40–1.20)
Chloride: 99 mEq/L (ref 96–112)
GFR: 68.42 mL/min (ref >60.00)
GLUCOSE: 93 mg/dL (ref 70–99)
Potassium: 3.7 mEq/L (ref 3.5–5.1)
SODIUM: 138 meq/L (ref 135–145)

## 2016-01-12 LAB — CBC
HCT: 39.7 % (ref 36.0–46.0)
Hemoglobin: 13.2 g/dL (ref 12.0–15.0)
MCHC: 33.2 g/dL (ref 30.0–36.0)
MCV: 90.5 fl (ref 78.0–100.0)
PLATELETS: 252 10*3/uL (ref 150.0–400.0)
RBC: 4.39 Mil/uL (ref 3.87–5.11)
RDW: 14.1 % (ref 11.5–15.5)
WBC: 5 10*3/uL (ref 4.0–10.5)

## 2016-01-12 LAB — LIPID PANEL
CHOL/HDL RATIO: 3
CHOLESTEROL: 216 mg/dL — AB (ref 0–200)
HDL: 83.5 mg/dL (ref >39.00)
LDL CALC: 116 mg/dL — AB (ref 0–99)
NonHDL: 132.39
TRIGLYCERIDES: 83 mg/dL (ref 0.0–149.0)
VLDL: 16.6 mg/dL (ref 0.0–40.0)

## 2016-01-12 LAB — HEMOGLOBIN A1C: Hgb A1c MFr Bld: 6.1 % (ref 4.6–6.5)

## 2016-02-07 ENCOUNTER — Ambulatory Visit (INDEPENDENT_AMBULATORY_CARE_PROVIDER_SITE_OTHER): Payer: Medicare HMO | Admitting: Family Medicine

## 2016-02-07 ENCOUNTER — Encounter: Payer: Self-pay | Admitting: Family Medicine

## 2016-02-07 VITALS — BP 140/88 | HR 97 | Temp 98.5°F | Ht 67.0 in | Wt 197.9 lb

## 2016-02-07 DIAGNOSIS — B9789 Other viral agents as the cause of diseases classified elsewhere: Secondary | ICD-10-CM

## 2016-02-07 DIAGNOSIS — J069 Acute upper respiratory infection, unspecified: Secondary | ICD-10-CM

## 2016-02-07 MED ORDER — HYDROCODONE-HOMATROPINE 5-1.5 MG/5ML PO SYRP: 5.0000 mL | ORAL_SOLUTION | Freq: Three times a day (TID) | ORAL | 0 refills | Status: DC | PRN

## 2016-02-07 NOTE — Progress Notes (Signed)
Christine Maldonado is a 83 year old widowed female nonsmoker a resident friend's home [place]...Marland KitchenMarland KitchenMarland Kitchen who comes in with a 2 day history of head congestion sore throat and cough. No fever chills nausea vomiting or diarrhea  Review of systems otherwise negative  Physical examination.BP 140/88 (BP Location: Left Arm, Patient Position: Sitting, Cuff Size: Normal)   Pulse 97   Temp 98.5 F (36.9 C) (Oral)   Ht 5\' 7"  (1.702 m)   Wt 197 lb 14.4 oz (89.8 kg)   LMP 01/30/1993   BMI 31.00 kg/m  Examination HEENT were negative neck was supple no adenopathy lungs are clear  Impression viral syndrome with cough........ treat symptomatically with liquids Tylenol and Hydromet

## 2016-02-07 NOTE — Patient Instructions (Signed)
Drink lots of liquids  Tylenol...... 2 tabs 3 times daily  Hydromet.......Marland Kitchen 1/2-1 teaspoon up to 3 times daily as needed for cough  If you feel there is an element of allergy then I will take Claritin plain 10 mg.....Marland Kitchen one tablet daily in the morning

## 2016-02-10 ENCOUNTER — Ambulatory Visit (INDEPENDENT_AMBULATORY_CARE_PROVIDER_SITE_OTHER): Payer: Medicare HMO | Admitting: Family Medicine

## 2016-02-10 ENCOUNTER — Encounter: Payer: Self-pay | Admitting: Family Medicine

## 2016-02-10 VITALS — BP 138/82 | HR 76 | Temp 97.9°F | Ht 67.0 in | Wt 196.6 lb

## 2016-02-10 DIAGNOSIS — J989 Respiratory disorder, unspecified: Secondary | ICD-10-CM

## 2016-02-10 MED ORDER — BENZONATATE 100 MG PO CAPS: 100.0000 mg | ORAL_CAPSULE | Freq: Two times a day (BID) | ORAL | 0 refills | Status: DC | PRN

## 2016-02-10 NOTE — Progress Notes (Signed)
HPI:  URI: -started: about 5-6 days ago -symptoms:nasal congestion, sore throat, cough - reports congestion is clear -denies:fever, SOB, NVD, tooth pain, rash, discolored congestion, body aches, wheezing -has tried: nothing, was afraid to take cough syrup provided by another provider -sick contacts/travel/risks: no reported flu, strep or tick exposure  ROS: See pertinent positives and negatives per HPI.  Past Medical History:  Diagnosis Date  . Anemia   . Bleeding 1993   BTB on HRT-endo biopsy proe with focal breakdown  . Bleeding 1995   BTB on HRT-benign endo biopsy  . Breast cyst 12/95   right breast  . Colon polyps   . Depression   . History of breast cancer 2008   DCIS/invasive ductal cancer right breast ER+/PR+  . Hot flashes 02/09/2014  . Hyperglycemia 02/09/2014   Predi[facility] 03/2013 at [facility]   . Hypertension   . Migraine   . Osteopenia   . Piriformis muscle pain   . Plantar fasciitis   . PUD (peptic ulcer disease) 8/94   Dr Magod-endoscopy  . Seasonal allergies   . Squamous cell carcinoma 6/11 7/11   on leg-removed  . Ulcer ([place])   . UTI (urinary tract infection)     Past Surgical History:  Procedure Laterality Date  . BREAST LUMPECTOMY  06/06/2006   right and sentinel node biopsy  . DILATION AND CURETTAGE OF UTERUS  50+ years ago   for retained placenta  . DILATION AND CURETTAGE OF UTERUS  1986  . KNEE ARTHROSCOPY  2016   Dr. Novella Olive    Family History  Problem Relation Age of Onset  . Ovarian cancer Mother   . Hypertension Mother   . Arthritis Father   . Heart attack Brother   . Diabetes Other     granddaughter  . Kidney failure Sister     Social History   Social History  . Marital status: Widowed    Spouse name: N/A  . Number of children: N/A  . Years of education: N/A   Social History Main Topics  . Smoking status: Former Smoker    Quit date: 06/27/1965  . Smokeless tobacco: Never Used  . Alcohol use No  . Drug use: No  . Sexual  activity: No   Other Topics Concern  . 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  Ascorbic Acid (VITAMIN C PO), Take 1,000 mg by mouth daily. , Disp: , Rfl:  .  CALCIUM PO, Take 600 mg by mouth daily. , Disp: , Rfl:  .  Coenzyme Q10 (CO Q 10 PO), Take by mouth daily., Disp: , Rfl:  .  CRANBERRY PO, Take 84 mg by mouth daily. , Disp: , Rfl:  .  docusate sodium (COLACE) 100 MG capsule, Take 200 mg by mouth at bedtime. , Disp: , Rfl:  .  Fexofenadine HCl (ALLEGRA PO), Take 0.5 tablets by mouth 2 (two) times daily. , Disp: , Rfl:  .  fish oil-omega-3 fatty acids 1000 MG capsule, Take 1 g by mouth daily. , Disp: , Rfl:  .  Garlic 123XX123 MG CAPS, Take 1,000 mg by mouth daily., Disp: ,  Rfl:  .  hydrochlorothiazide (HYDRODIURIL) 25 MG tablet, Take 1 tablet (25 mg total) by mouth daily., Disp: 90 tablet, Rfl: 3 .  Multiple Vitamin (MULTIVITAMIN) capsule, Take 1 capsule by mouth daily.  , Disp: , Rfl:  .  NON FORMULARY, Tumeric, Disp: , Rfl:  .  Probiotic Product (PROBIOTIC DAILY PO), Take by mouth daily., Disp: , Rfl:  .  venlafaxine (EFFEXOR) 75 MG tablet, Take 1 tablet (75 mg total) by mouth 2 (two) times daily with a meal., Disp: 180 tablet, Rfl: 1 .  VITAMIN D, CHOLECALCIFEROL, PO, Take 4,000 Int'l Units by mouth. , Disp: , Rfl:  .  benzonatate (TESSALON) 100 MG capsule, Take 1 capsule (100 mg total) by mouth 2 (two) times daily as needed for cough., Disp: 20 capsule, Rfl: 0  EXAM:  Vitals:   02/10/16 0946  BP: 138/82  Pulse: 76  Temp: 97.9 F (36.6 C)    Body mass index is 30.79 kg/m.  GENERAL: vitals reviewed and listed above, alert, oriented, appears well hydrated and in no acute distress  HEENT: atraumatic, conjunttiva clear, no obvious  abnormalities on inspection of external nose and ears, normal appearance of ear canals and TMs, clear nasal congestion, mild post oropharyngeal erythema with PND, no tonsillar edema or exudate, no sinus TTP  NECK: no obvious masses on inspection  LUNGS: clear to auscultation bilaterally, no wheezes, rales or rhonchi, good air movement  CV: HRRR, no peripheral edema  MS: moves all extremities without noticeable abnormality  PSYCH: pleasant and cooperative, no obvious depression or anxiety  ASSESSMENT AND PLAN:  Discussed the following assessment and plan:  Respiratory illness  -given HPI and exam findings today, a serious infection or illness is unlikely. We discussed potential etiologies, with VURI being most likely, and advised supportive care and monitoring. She really didn't have flu like symptoms, but discussed pot for mild flu. Out of treatment window for benefit from tamiflu. No signs secondary infection today. Tessalon for cough. We discussed treatment side effects, likely course, antibiotic misuse, transmission, potential complications, return precuations and signs of developing a serious illness. -of course, we advised to return or notify a doctor immediately if symptoms worsen or persist or new concerns arise.    Patient Instructions  INSTRUCTIONS FOR UPPER RESPIRATORY INFECTION:  -plenty of rest and fluids  -nasal saline wash 2-3 times daily (use prepackaged nasal saline or bottled/distilled water if making your own)   -can use AFRIN nasal spray for drainage and nasal congestion - but do NOT use longer then 3-4 days  -can use tylenol (in no history of liver disease) or ibuprofen (if no history of kidney disease, bowel bleeding or significant heart disease) as directed for aches and sorethroat  -in the winter time, using a humidifier at night is helpful (please follow cleaning instructions)  -if you are taking a cough medication - use only as directed, may also try a  teaspoon of honey to coat the throat and throat lozenges. I sent the tessalon perles to the pharmacy for you to try for the cough.  -for sore throat, salt water gargles can help  -follow up if you have fevers, facial pain, tooth pain, difficulty breathing or are worsening or symptoms persist longer then expected  Upper Respiratory Infection, Adult An upper respiratory infection (URI) is also known as the common cold. It is often caused by a type of germ (virus). Colds are easily spread (contagious). You can pass it to others by kissing, coughing, sneezing, or  drinking out of the same glass. Usually, you get better in 1 to 3  weeks.  However, the cough can last for even longer. HOME CARE   Only take medicine as told by your doctor. Follow instructions provided above.  Drink enough water and fluids to keep your pee (urine) clear or pale yellow.  Get plenty of rest.  Return to work when your temperature is < 100 for 24 hours or as told by your doctor. You may use a face mask and wash your hands to stop your cold from spreading. GET HELP RIGHT AWAY IF:   After the first few days, you feel you are getting worse.  You have questions about your medicine.  You have chills, shortness of breath, or red spit (mucus).  You have pain in the face for more then 1-2 days, especially when you bend forward.  You have a fever, puffy (swollen) neck, pain when you swallow, or white spots in the back of your throat.  You have a bad headache, ear pain, sinus pain, or chest pain.  You have a high-pitched whistling sound when you breathe in and out (wheezing).  You cough up blood.  You have sore muscles or a stiff neck. MAKE SURE YOU:   Understand these instructions.  Will watch your condition.  Will get help right away if you are not doing well or get worse. Document Released: 07/05/2007 Document Revised: 04/10/2011 Document Reviewed: 04/23/2013 [facility] Patient Information 2015 [employer], [place].  This information is not intended to replace advice given to you by your health care provider. Make sure you discuss any questions you have with your health care provider.    Colin Benton R., DO

## 2016-02-10 NOTE — Patient Instructions (Signed)
INSTRUCTIONS FOR UPPER RESPIRATORY INFECTION:  -plenty of rest and fluids  -nasal saline wash 2-3 times daily (use prepackaged nasal saline or bottled/distilled water if making your own)   -can use AFRIN nasal spray for drainage and nasal congestion - but do NOT use longer then 3-4 days  -can use tylenol (in no history of liver disease) or ibuprofen (if no history of kidney disease, bowel bleeding or significant heart disease) as directed for aches and sorethroat  -in the winter time, using a humidifier at night is helpful (please follow cleaning instructions)  -if you are taking a cough medication - use only as directed, may also try a teaspoon of honey to coat the throat and throat lozenges. I sent the tessalon perles to the pharmacy for you to try for the cough.  -for sore throat, salt water gargles can help  -follow up if you have fevers, facial pain, tooth pain, difficulty breathing or are worsening or symptoms persist longer then expected  Upper Respiratory Infection, Adult An upper respiratory infection (URI) is also known as the common cold. It is often caused by a type of germ (virus). Colds are easily spread (contagious). You can pass it to others by kissing, coughing, sneezing, or drinking out of the same glass. Usually, you get better in 1 to 3  weeks.  However, the cough can last for even longer. HOME CARE   Only take medicine as told by your doctor. Follow instructions provided above.  Drink enough water and fluids to keep your pee (urine) clear or pale yellow.  Get plenty of rest.  Return to work when your temperature is < 100 for 24 hours or as told by your doctor. You may use a face mask and wash your hands to stop your cold from spreading. GET HELP RIGHT AWAY IF:   After the first few days, you feel you are getting worse.  You have questions about your medicine.  You have chills, shortness of breath, or red spit (mucus).  You have pain in the face for more then  1-2 days, especially when you bend forward.  You have a fever, puffy (swollen) neck, pain when you swallow, or white spots in the back of your throat.  You have a bad headache, ear pain, sinus pain, or chest pain.  You have a high-pitched whistling sound when you breathe in and out (wheezing).  You cough up blood.  You have sore muscles or a stiff neck. MAKE SURE YOU:   Understand these instructions.  Will watch your condition.  Will get help right away if you are not doing well or get worse. Document Released: 07/05/2007 Document Revised: 04/10/2011 Document Reviewed: 04/23/2013 [facility] Patient Information 2015 [place], [place]. This information is not intended to replace advice given to you by your health care provider. Make sure you discuss any questions you have with your health care provider.

## 2016-02-10 NOTE — Progress Notes (Signed)
Pre visit review using our clinic review tool, if applicable. No additional management support is needed unless otherwise documented below in the visit note. 

## 2016-02-22 ENCOUNTER — Telehealth: Payer: Self-pay | Admitting: Nurse Practitioner

## 2016-02-22 NOTE — Telephone Encounter (Signed)
Left message to call Aliya Sol at [phone].  

## 2016-02-22 NOTE — Telephone Encounter (Signed)
Patient cancelled aex appointment for Friday. Would like nurse to call her if it's necessary that she reschedule for an aex.

## 2016-02-23 NOTE — Telephone Encounter (Signed)
Spoke with patient. Patient states she was scheduled for an AEX on a Friday and would never have scheduled on a Friday, and do I still need a AEX? Patient states Dr. Sabra Heck told her she does not have to have paps anymore. Patient states she is ok with scheduling AEX, but would prefer not to have a pap. Patient states she likes Dr. Sabra Heck and Kem Boroughs, NP -can schedule with whoever has appt available on Mon and Tuesdays. Patient scheduled for AEX 03/06/16 at 1pm with Kem Boroughs, NP. Patient is agreeable to date and time.   Routing to provider for final review. Patient is agreeable to disposition. Will close encounter.  Cc: Dr. Sabra Heck

## 2016-02-25 ENCOUNTER — Ambulatory Visit: Payer: Medicare HMO | Admitting: Nurse Practitioner

## 2016-03-06 ENCOUNTER — Ambulatory Visit (INDEPENDENT_AMBULATORY_CARE_PROVIDER_SITE_OTHER): Payer: Medicare HMO | Admitting: Nurse Practitioner

## 2016-03-06 ENCOUNTER — Encounter: Payer: Self-pay | Admitting: Nurse Practitioner

## 2016-03-06 VITALS — BP 126/82 | HR 72 | Ht 66.5 in | Wt 196.0 lb

## 2016-03-06 DIAGNOSIS — E559 Vitamin D deficiency, unspecified: Secondary | ICD-10-CM

## 2016-03-06 DIAGNOSIS — C50311 Malignant neoplasm of lower-inner quadrant of right female breast: Secondary | ICD-10-CM | POA: Diagnosis not present

## 2016-03-06 DIAGNOSIS — Z170 Estrogen receptor positive status [ER+]: Secondary | ICD-10-CM

## 2016-03-06 DIAGNOSIS — Z01411 Encounter for gynecological examination (general) (routine) with abnormal findings: Secondary | ICD-10-CM

## 2016-03-06 NOTE — Progress Notes (Signed)
Patient ID: Christine Maldonado, female   DOB: [birth year], 83 y.o.   MRN: [id number]  83 y.o. [id number] Widowed  Caucasian Fe here for annual exam.  She feels well other that a pain at left rib area.  She was visiting with her daughter and on 01/22/16 fell and leaned back against the door shoving her elbow into her side.  She had a lot of pain but has lessened over the past 6 wk's.  She is on Vit D supplementation and Vit D was not done at PCP - will do here today.  She enjoys living at [facility].  Patient's last menstrual period was 01/30/1993.          Sexually active: No.  The current method of family planning is none.    Exercising: Yes.    walking Smoker:  no  Health Maintenance: Pap:  11/11/12 Negative, no other history available MMG: 10/21/15, Bi-Rads 1: Negative Colonoscopy:  05/08/12 hemorrhoids  BMD:  05/31/14 T Score, -0.2 Spine / -1.6 Right Femur Neck / -1.7 Left Femur Neck TDaP:  2013 Shingles: 07/01/07 Pneumonia: 05/18/14 Pneumovax, 04/17/13 Prevnar-13 Hep C and HIV: Not indicated due to age Labs: PCP in EPIC   reports that she quit smoking about 50 years ago. She has never used smokeless tobacco. She reports that she does not drink alcohol or use drugs.  Past Medical History:  Diagnosis Date  . Anemia   . Bleeding 1993   BTB on HRT-endo biopsy proe with focal breakdown  . Bleeding 1995   BTB on HRT-benign endo biopsy  . Breast cyst 12/95   right breast  . Colon polyps   . Depression   . History of breast cancer 2008   DCIS/invasive ductal cancer right breast ER+/PR+  . Hot flashes 02/09/2014  . Hyperglycemia 02/09/2014   Prediabetes labs 03/2013 at [facility]   . Hypertension   . Migraine   . Osteopenia   . Piriformis muscle pain   . Plantar fasciitis   . PUD (peptic ulcer disease) 8/94   Dr Magod-endoscopy  . Seasonal allergies   . Squamous cell carcinoma 6/11 7/11   on leg-removed  . Ulcer ([place])   . UTI (urinary tract infection)     Past Surgical History:   Procedure Laterality Date  . BREAST LUMPECTOMY Right 06/06/2006   right inner quadrant - invasive with sentinel node biopsy, ER/ PR +;  Her2 Nu negtive  . DILATION AND CURETTAGE OF UTERUS  50+ years ago   for retained placenta  . DILATION AND CURETTAGE OF UTERUS  1986  . KNEE ARTHROSCOPY  2016   Dr. Novella Olive    Current Outpatient Prescriptions  Medication Sig Dispense Refill  . Ascorbic Acid (VITAMIN C PO) Take 1,000 mg by mouth daily.     Marland Kitchen CALCIUM PO Take 600 mg by mouth daily.     . Coenzyme Q10 (CO Q 10 PO) Take by mouth daily.    Marland Kitchen CRANBERRY PO Take 84 mg by mouth daily.     Marland Kitchen docusate sodium (COLACE) 100 MG capsule Take 200 mg by mouth at bedtime.     Marland Kitchen Fexofenadine HCl (ALLEGRA PO) Take 0.5 tablets by mouth 2 (two) times daily.     . fish oil-omega-3 fatty acids 1000 MG capsule Take 1 g by mouth daily.     . Garlic 3419 MG CAPS Take 1,000 mg by mouth daily.    . hydrochlorothiazide (HYDRODIURIL) 25 MG tablet Take 1 tablet (25 mg  total) by mouth daily. 90 tablet 3  . Multiple Vitamin (MULTIVITAMIN) capsule Take 1 capsule by mouth daily.      . NON FORMULARY Tumeric    . Probiotic Product (PROBIOTIC DAILY PO) Take by mouth daily.    Marland Kitchen venlafaxine (EFFEXOR) 75 MG tablet Take 1 tablet (75 mg total) by mouth 2 (two) times daily with a meal. 180 tablet 1  . VITAMIN D, CHOLECALCIFEROL, PO Take 4,000 Int'l Units by mouth.      No current facility-administered medications for this visit.     Family History  Problem Relation Age of Onset  . Ovarian cancer Mother   . Hypertension Mother   . Arthritis Father   . Heart attack Brother   . Diabetes Other     granddaughter  . Kidney failure Sister     ROS:  Pertinent items are noted in HPI.  Otherwise, a comprehensive ROS was negative.  Exam:   BP 126/82 (BP Location: Left Arm, Patient Position: Sitting, Cuff Size: Large)   Pulse 72   Ht 5' 6.5" (1.689 m)   Wt 196 lb (88.9 kg)   LMP 01/30/1993   BMI 31.16 kg/m  Height: 5'  6.5" (168.9 cm) Ht Readings from Last 3 Encounters:  03/06/16 5' 6.5" (1.689 m)  02/10/16 '5\' 7"'$  (1.702 m)  02/07/16 '5\' 7"'$  (1.702 m)    General appearance: alert, cooperative and appears stated age Head: Normocephalic, without obvious abnormality, atraumatic Neck: no adenopathy, supple, symmetrical, trachea midline and thyroid normal to inspection and palpation Lungs: clear to auscultation bilaterally Breasts: normal appearance, no masses or tenderness Heart: regular rate and rhythm Abdomen: soft, non-tender; no masses,  no organomegaly Extremities: extremities normal, atraumatic, no cyanosis or edema Skin: Skin color, texture, turgor normal. No rashes or lesions Lymph nodes: Cervical, supraclavicular, and axillary nodes normal. No abnormal inguinal nodes palpated Neurologic: Grossly normal   Pelvic: External genitalia:  no lesions              Urethra:  normal appearing urethra with no masses, tenderness or lesions              Bartholin's and Skene's: normal                 Vagina: normal appearing vagina with normal color and discharge, no lesions              Cervix: anteverted              Pap taken: No. Bimanual Exam:  Uterus:  normal size, contour, position, consistency, mobility, non-tender              Adnexa: no mass, fullness, tenderness               Rectovaginal: Confirms               Anus:  normal sphincter tone, no lesions  Chaperone present: yes  A:  Well Woman with normal exam  Postmenopausal, off HRT.  (Took HRT 7/93- 05/2006)  H/O Right breast cancer 06/06/2006 ER+,PR+, Her 2 neu -, s/p lumpectomy and neg sentinel node biopsy  Squamous cell carcinoma at base of nose, sees derm every six months  Osteopenia and Vit D deficiency     P:   Reviewed health and wellness pertinent to exam  Pap smear as above  Mammogram is due 09/2016  Will get Vit D and follow  Counseled on breast self exam, mammography screening, adequate intake of calcium and vitamin  D, diet  and exercise, Kegel's exercises return annually or prn  An After Visit Summary was printed and given to the patient.

## 2016-03-06 NOTE — Progress Notes (Signed)
Opened in error

## 2016-03-06 NOTE — Patient Instructions (Signed)

## 2016-03-07 LAB — VITAMIN D 25 HYDROXY (VIT D DEFICIENCY, FRACTURES): Vit D, 25-Hydroxy: 48 ng/mL (ref 30–100)

## 2016-03-07 NOTE — Progress Notes (Signed)
Encounter reviewed by Dr. Quadir Muns Amundson C. Silva.  

## 2016-04-06 DIAGNOSIS — D1801 Hemangioma of skin and subcutaneous tissue: Secondary | ICD-10-CM | POA: Diagnosis not present

## 2016-04-06 DIAGNOSIS — L821 Other seborrheic keratosis: Secondary | ICD-10-CM | POA: Diagnosis not present

## 2016-04-06 DIAGNOSIS — D225 Melanocytic nevi of trunk: Secondary | ICD-10-CM | POA: Diagnosis not present

## 2016-04-06 DIAGNOSIS — L570 Actinic keratosis: Secondary | ICD-10-CM | POA: Diagnosis not present

## 2016-04-06 DIAGNOSIS — L400 Psoriasis vulgaris: Secondary | ICD-10-CM | POA: Diagnosis not present

## 2016-04-06 DIAGNOSIS — L814 Other melanin hyperpigmentation: Secondary | ICD-10-CM | POA: Diagnosis not present

## 2016-04-06 DIAGNOSIS — L111 Transient acantholytic dermatosis [Grover]: Secondary | ICD-10-CM | POA: Diagnosis not present

## 2016-04-09 NOTE — Progress Notes (Signed)
HPI:  Christine Maldonado is a pleasant 83 y.o. here for follow up. Chronic medical problems summarized below were reviewed for changes and stability and were updated as needed below. These issues and their treatment remain stable for the most part. Doing well. Her Effexor has become more expensive. Wants to continue as mood is improved on it. She is seeing a Doctor at the [place] to try to get prescriptions there, but plans to keep [place] as her PCP in the community. Denies CP, SOB, DOE, treatment intolerance or new symptoms. Medicare exam due in June.  HTN: -meds: hctz  MDD: -effexor  Diet controlled Prediabetes: -no meds  Seasonal allergies: -meds: allegra  Seb derm scalp: -sees dermatologist  Sees Dr. Hyacinth Meeker in gyn. Sees Dr. Yisroel Ramming for hammer toe.  ROS: See pertinent positives and negatives per HPI.  Past Medical History:  Diagnosis Date  . Anemia   . Bleeding 1993   BTB on HRT-endo biopsy proe with focal breakdown  . Bleeding 1995   BTB on HRT-benign endo biopsy  . Breast cyst 12/95   right breast  . Colon polyps   . Depression   . History of breast cancer 2008   DCIS/invasive ductal cancer right breast ER+/PR+  . Hot flashes 02/09/2014  . Hyperglycemia 02/09/2014   Prediabetes labs 03/2013 at [facility]   . Hypertension   . Migraine   . Osteopenia   . Piriformis muscle pain   . Plantar fasciitis   . PUD (peptic ulcer disease) 8/94   Dr Magod-endoscopy  . Seasonal allergies   . Squamous cell carcinoma 6/11 7/11   on leg-removed  . Ulcer ([facility])   . UTI (urinary tract infection)     Past Surgical History:  Procedure Laterality Date  . BREAST LUMPECTOMY Right 06/06/2006   right inner quadrant - invasive with sentinel node biopsy, ER/ PR +;  Her2 Nu negtive  . DILATION AND CURETTAGE OF UTERUS  50+ years ago   for retained placenta  . DILATION AND CURETTAGE OF UTERUS  1986  . KNEE ARTHROSCOPY  2016   Dr. Margreta Journey    Family History  Problem Relation Age of Onset  .  Ovarian cancer Mother   . Hypertension Mother   . Arthritis Father   . Heart attack Brother   . Diabetes Other     granddaughter  . Kidney failure Sister     Social History   Social History  . Marital status: Widowed    Spouse name: N/A  . Number of children: N/A  . Years of education: N/A   Social History Main Topics  . Smoking status: Former Smoker    Quit date: 06/27/1965  . Smokeless tobacco: Never Used  . Alcohol use No  . Drug use: No  . Sexual activity: No   Other Topics Concern  . None   Social History Narrative   Work or School: retired, Advice worker Situation: lives alone      Spiritual Beliefs:       Lifestyle: no regular exercise - is going to get back into silver sneakers; diet is good              Current Outpatient Prescriptions:  .  Ascorbic Acid (VITAMIN C PO), Take 1,000 mg by mouth daily. , Disp: , Rfl:  .  CALCIUM PO, Take 600 mg by mouth daily. , Disp: , Rfl:  .  Coenzyme Q10 (CO Q 10 PO), Take by mouth daily.,  Disp: , Rfl:  .  CRANBERRY PO, Take 84 mg by mouth daily. , Disp: , Rfl:  .  docusate sodium (COLACE) 100 MG capsule, Take 200 mg by mouth at bedtime. , Disp: , Rfl:  .  Fexofenadine HCl (ALLEGRA PO), Take 0.5 tablets by mouth 2 (two) times daily. , Disp: , Rfl:  .  fish oil-omega-3 fatty acids 1000 MG capsule, Take 1 g by mouth daily. , Disp: , Rfl:  .  Garlic 2202 MG CAPS, Take 1,000 mg by mouth daily., Disp: , Rfl:  .  hydrochlorothiazide (HYDRODIURIL) 25 MG tablet, Take 1 tablet (25 mg total) by mouth daily., Disp: 90 tablet, Rfl: 3 .  Multiple Vitamin (MULTIVITAMIN) capsule, Take 1 capsule by mouth daily.  , Disp: , Rfl:  .  NON FORMULARY, Tumeric, Disp: , Rfl:  .  Probiotic Product (PROBIOTIC DAILY PO), Take by mouth daily., Disp: , Rfl:  .  venlafaxine (EFFEXOR) 75 MG tablet, Take 1 tablet (75 mg total) by mouth 2 (two) times daily with a meal., Disp: 180 tablet, Rfl: 1 .  VITAMIN D, CHOLECALCIFEROL, PO, Take 4,000  Int'l Units by mouth. , Disp: , Rfl:   EXAM:  Vitals:   04/10/16 0924  BP: 122/82  Pulse: 86  Temp: 97.5 F (36.4 C)    Body mass index is 30.65 kg/m.  GENERAL: vitals reviewed and listed above, alert, oriented, appears well hydrated and in no acute distress  HEENT: atraumatic, conjunttiva clear, no obvious abnormalities on inspection of external nose and ears  NECK: no obvious masses on inspection  LUNGS: clear to auscultation bilaterally, no wheezes, rales or rhonchi, good air movement  CV: HRRR, no peripheral edema  MS: moves all extremities without noticeable abnormality  PSYCH: pleasant and cooperative, no obvious depression or anxiety  ASSESSMENT AND PLAN:  Discussed the following assessment and plan:  Essential hypertension, benign  Recurrent major depressive disorder, in full remission ([place])  Hyperglycemia  BMI 30.0-30.9,adult  -printed rx for effexor to shop around -advised if going to [place] to ensure always brings copies labs/studies/vaccines/medication done there -lifestyle recs -labs at CPE -Patient advised to return or notify a doctor immediately if symptoms worsen or persist or new concerns arise.  Patient Instructions  BEFORE YOU LEAVE: -follow up: Medicare exam with Manuela Schwartz and follow up with Dr. Maudie Mercury in June   We recommend the following healthy lifestyle for LIFE: 1) Small portions.   Tip: eat off of a salad plate instead of a dinner plate.  Tip: It is ok to feel hungry after a meal   Tip: if you need more or a snack choose fruits, veggies and/or a handful of nuts or seeds.  2) Eat a healthy clean diet.  * Tip: Avoid (less then 1 serving per week): processed foods, sweets, sweetened drinks, white starches (rice, flour, bread, potatoes, pasta, etc), red meat, fast foods, butter  *Tip: CHOOSE instead   * 5-9 servings per day of fresh or frozen fruits and vegetables (but not corn, potatoes, bananas, canned or dried fruit)   *nuts and seeds,  beans   *olives and olive oil   *small portions of lean meats such as fish and white chicken    *small portions of whole grains  3)Get at least 150 minutes of sweaty aerobic exercise per week.  4)Reduce stress - consider counseling, meditation and relaxation to balance other aspects of your life.     Colin Benton R., DO

## 2016-04-10 ENCOUNTER — Encounter: Payer: Self-pay | Admitting: Family Medicine

## 2016-04-10 ENCOUNTER — Ambulatory Visit (INDEPENDENT_AMBULATORY_CARE_PROVIDER_SITE_OTHER): Payer: Medicare HMO | Admitting: Family Medicine

## 2016-04-10 VITALS — BP 122/82 | HR 86 | Temp 97.5°F | Ht 67.0 in | Wt 195.7 lb

## 2016-04-10 DIAGNOSIS — F3342 Major depressive disorder, recurrent, in full remission: Secondary | ICD-10-CM | POA: Diagnosis not present

## 2016-04-10 DIAGNOSIS — I10 Essential (primary) hypertension: Secondary | ICD-10-CM

## 2016-04-10 DIAGNOSIS — Z6830 Body mass index (BMI) 30.0-30.9, adult: Secondary | ICD-10-CM

## 2016-04-10 DIAGNOSIS — R739 Hyperglycemia, unspecified: Secondary | ICD-10-CM

## 2016-04-10 DIAGNOSIS — R69 Illness, unspecified: Secondary | ICD-10-CM | POA: Diagnosis not present

## 2016-04-10 MED ORDER — VENLAFAXINE HCL 75 MG PO TABS: 75.0000 mg | ORAL_TABLET | Freq: Two times a day (BID) | ORAL | 1 refills | Status: DC

## 2016-04-10 NOTE — Progress Notes (Signed)
Pre visit review using our clinic review tool, if applicable. No additional management support is needed unless otherwise documented below in the visit note. 

## 2016-04-10 NOTE — Patient Instructions (Signed)
BEFORE YOU LEAVE: -follow up: Medicare exam with Manuela Schwartz and follow up with Dr. Maudie Mercury in June   We recommend the following healthy lifestyle for LIFE: 1) Small portions.   Tip: eat off of a salad plate instead of a dinner plate.  Tip: It is ok to feel hungry after a meal   Tip: if you need more or a snack choose fruits, veggies and/or a handful of nuts or seeds.  2) Eat a healthy clean diet.  * Tip: Avoid (less then 1 serving per week): processed foods, sweets, sweetened drinks, white starches (rice, flour, bread, potatoes, pasta, etc), red meat, fast foods, butter  *Tip: CHOOSE instead   * 5-9 servings per day of fresh or frozen fruits and vegetables (but not corn, potatoes, bananas, canned or dried fruit)   *nuts and seeds, beans   *olives and olive oil   *small portions of lean meats such as fish and white chicken    *small portions of whole grains  3)Get at least 150 minutes of sweaty aerobic exercise per week.  4)Reduce stress - consider counseling, meditation and relaxation to balance other aspects of your life.

## 2016-05-01 ENCOUNTER — Other Ambulatory Visit: Payer: Self-pay | Admitting: Family Medicine

## 2016-05-09 NOTE — Progress Notes (Addendum)
Subjective:   Christine Maldonado is a 83 y.o. female who presents for Medicare Annual (Subsequent) preventive examination.  The Patient was informed that the wellness visit is to identify future health risk and educate and initiate measures that can reduce risk for increased disease through the lifespan.    NO ROS; Medicare Wellness Visit Lives at [place] there Mar 13 2015; very nice  Next apt 6/4 with Dr. Maudie Mercury   Describes health as good, fair or great?  Health is very good Present problem with post nasal drip since Jan 5th; verbalizes this may be allergies as well.  Psychosocial:  Was in the air [employer] during the [place] war Was in [place] and [place] died in 04-30-2022; [facility] into [facility] last year  Worked  In the control tower.  Spouse worked Armed forces logistics/support/administrative officer  She has VA benefits now; gets meds form the [facility] in [facility] and meeting new friends   Preventive Screening -Counseling & Management  Colonoscopy; 04/2012 aged out  [place] 10/2015 - have mammogram every years  Bone Density 05/2014  (-1.7)   Current smoking/ Former smoker  Quit 1967; approx 15 year hx with 15 pack years Second Hand Smoke status; No Smokers in the home no ETOH: no  RISK FACTORS Regular exercise  Last week decided to go to exercise class  They are offered every day with yoga and will try to go   Diet Eats dinner at [facility] or lunch Has one meal in home and will eat lunch or dinner there. Bowl of cereal in the am w fruit [employer]; daily double burger at times   Fall risk  Mobility of Functional changes this year? no Safety; community safe  Cardiac Risk Factors:  Advanced aged  >18 in women Hyperlipidemia - ratio is 3; cho 216; HDL 83; LDL 116 and trig 83  Diabetes - BS 93; A1c 6.1 Discussed pre diabetes and exercise will help  Family History - ovarian cancer; and htn; Father had OA/ sister kidney failure  Brother MI  Obesity 31.6; will start to  exercise  Depression Screen PhQ 2: negative Has been depressed and on Effexor  for a long time and it helps her stay stable Now prescribed  from the [place]   Activities of Daily Living - See functional screen   Hearing Screening Comments: VA for hearing check Did get a hearing aid  Does not wear one now  Vision Screening Comments: Vision check  Last year; annual  Dr. Kimberlee Nearing  (states her hearing aid "itched"   Cognitive testing; Ad8 score; 0 or less than 2  MMSE deferred or completed if AD8 + 2 issues  Patient Care Team: Lucretia Kern, DO as PCP - General (Family Medicine)    Immunization History  Administered Date(s) Administered  . Influenza, High Dose Seasonal PF 10/21/2014, 11/09/2015  . Influenza,inj,Quad PF,36+ Mos 10/20/2013  . PPD Test 12/07/2014  . Pneumococcal Conjugate-13 04/17/2013  . Pneumococcal Polysaccharide-23 05/18/2014   Required Immunizations needed today  Screening test up to date or reviewed for plan of completion There are no preventive care reminders to display for this patient.  Cardiac Risk Factors include: advanced age (>53mn, >>40women);hypertension     Objective:     Vitals: BP (!) 150/70   Pulse 84   Ht '5\' 6"'$  (1.676 m)   Wt 196 lb 5 oz (89 kg)   LMP 01/30/1993   SpO2 94%   BMI 31.69 kg/m  Body mass index is 31.69 kg/m.   Tobacco History  Smoking Status  . Former Smoker  . Packs/day: 1.00  . Years: 15.00  . Quit date: 06/27/1965  Smokeless Tobacco  . Never Used    Comment: smoked in the air [employer]; 15 year approx     Counseling given: Yes   Past Medical History:  Diagnosis Date  . Anemia   . Bleeding 1993   BTB on HRT-endo biopsy proe with focal breakdown  . Bleeding 1995   BTB on HRT-benign endo biopsy  . Breast cyst 12/95   right breast  . Colon polyps   . Depression   . History of breast cancer 2008   DCIS/invasive ductal cancer right breast ER+/PR+  . Hot flashes 02/09/2014  . Hyperglycemia 02/09/2014    [facility]   . Hypertension   . Migraine   . Osteopenia   . Piriformis muscle pain   . Plantar fasciitis   . PUD (peptic ulcer disease) 8/94   Dr Magod-endoscopy  . Seasonal allergies   . Squamous cell carcinoma 6/11 7/11   on leg-removed  . Ulcer ([place])   . UTI (urinary tract infection)    Past Surgical History:  Procedure Laterality Date  . BREAST LUMPECTOMY Right 06/06/2006   right inner quadrant - invasive with sentinel node biopsy, ER/ PR +;  Her2 Nu negtive  . DILATION AND CURETTAGE OF UTERUS  50+ years ago   for retained placenta  . DILATION AND CURETTAGE OF UTERUS  1986  . KNEE ARTHROSCOPY  2016   Dr. Novella Olive   Family History  Problem Relation Age of Onset  . Ovarian cancer Mother   . Hypertension Mother   . Arthritis Father   . Heart attack Brother   . Diabetes Other     granddaughter  . Kidney failure Sister    History  Sexual Activity  . Sexual activity: No    Outpatient Encounter Prescriptions as of 05/10/2016  Medication Sig  . Ascorbic Acid (VITAMIN C PO) Take 1,000 mg by mouth daily.   Marland Kitchen CALCIUM PO Take 600 mg by mouth daily.   . Coenzyme Q10 (CO Q 10 PO) Take by mouth daily.  Marland Kitchen CRANBERRY PO Take 84 mg by mouth daily.   Marland Kitchen docusate sodium (COLACE) 100 MG capsule Take 200 mg by mouth at bedtime.   Marland Kitchen Fexofenadine HCl (ALLEGRA PO) Take 0.5 tablets by mouth 2 (two) times daily.   . fish oil-omega-3 fatty acids 1000 MG capsule Take 1 g by mouth daily.   . Garlic 1607 MG CAPS Take 1,000 mg by mouth daily.  . hydrochlorothiazide (HYDRODIURIL) 25 MG tablet TAKE ONE TABLET BY MOUTH ONCE DAILY  . Multiple Vitamin (MULTIVITAMIN) capsule Take 1 capsule by mouth daily.    . NON FORMULARY Tumeric  . Probiotic Product (PROBIOTIC DAILY PO) Take by mouth daily.  Marland Kitchen venlafaxine (EFFEXOR) 75 MG tablet Take 1 tablet (75 mg total) by mouth 2 (two) times daily with a meal.  . VITAMIN D, CHOLECALCIFEROL, PO Take 4,000 Int'l Units by mouth.    No  facility-administered encounter medications on file as of 05/10/2016.     Activities of Daily Living In your present state of health, do you have any difficulty performing the following activities: 05/10/2016  Hearing? N  Vision? N  Difficulty concentrating or making decisions? N  Walking or climbing stairs? N  Dressing or bathing? N  Doing errands, shopping? N  Conservation officer, nature and  eating ? N  Using the Toilet? N  In the past six months, have you accidently leaked urine? N  Do you have problems with loss of bowel control? N  Managing your Medications? N  Managing your Finances? N  Housekeeping or managing your Housekeeping? N  Some recent data might be hidden    Patient Care Team: Lucretia Kern, DO as PCP - General (Family Medicine)    Assessment:     Exercise Activities and Dietary recommendations Current Exercise Habits: Structured exercise class, Type of exercise: walking;yoga, Time (Minutes): 30, Frequency (Times/Week): 3, Weekly Exercise (Minutes/Week): 90, Intensity: Mild  Goals    . Exercise 150 minutes per week (moderate activity)          Will try to go to exercise class 1030 and 11am for meditation       Fall Risk Fall Risk  05/10/2016 07/27/2015 08/24/2014 04/17/2013  Falls in the past year? No Yes No Yes  Number falls in past yr: - 1 - 1  Injury with Fall? - - - Yes  Risk for fall due to : - - - History of fall(s)   Depression Screen PHQ 2/9 Scores 05/10/2016 04/10/2016 07/27/2015 08/24/2014  PHQ - 2 Score 0 0 0 0     Cognitive Function MMSE - Mini Mental State Exam 05/10/2016  Not completed: (No Data)    Recall of 3 objects was 3/3 after 5 minutes. Recall in storytelling slow but gradually remembers  Affect normal; Voices enjoying her life in her later years     Immunization History  Administered Date(s) Administered  . Influenza, High Dose Seasonal PF 10/21/2014, 11/09/2015  . Influenza,inj,Quad PF,36+ Mos 10/20/2013  . PPD Test 12/07/2014  .  Pneumococcal Conjugate-13 04/17/2013  . Pneumococcal Polysaccharide-23 05/18/2014   Screening Tests Health Maintenance  Topic Date Due  . INFLUENZA VACCINE  08/30/2016  . MAMMOGRAM  10/20/2016  . TETANUS/TDAP  07/01/2019  . DEXA SCAN  Addressed  . PNA vac Low Risk Adult  Completed      Plan:      PCP Notes  Health Maintenance No record of tetanus but states she had one  [facility]tered [facility] last year  Will schedule her eye exam  Discussed repeat of DEXA and will discuss with Dr. Maudie Mercury at her next visit. Due to repeat in June; Bone loss -1.7 in femur; Encouraged strength bearing exercise consistently.   Abnormal Screens  [facility] but [facility]  Referrals none  Patient concerns; none  Nurse Concerns; BP up but eating most likely has sodium.  Will check at next apt.  Next PCP apt 07/03/2013    During the course of the visit the patient was educated and counseled about the following appropriate screening and preventive services:   Vaccines to include Pneumoccal, Influenza, Hepatitis B, Td, Zostavax, HCV  Electrocardiogram  Cardiovascular Disease  Colorectal cancer screening aged out  Bone density screening deferred to Dr. Maudie Mercury in June  Diabetes screening A1c elevated and discussed exercise   Glaucoma screening - neg  Mammography/repeats every year  Nutrition counseling   Patient Instructions (the written plan) was given to the patient.   XIPJA,SNKNL, RN  05/10/2016  Agree with assessment as above.  Eulas Post MD [facility] at [facility]

## 2016-05-10 ENCOUNTER — Ambulatory Visit (INDEPENDENT_AMBULATORY_CARE_PROVIDER_SITE_OTHER): Payer: Medicare HMO

## 2016-05-10 VITALS — BP 150/70 | HR 84 | Ht 66.0 in | Wt 196.3 lb

## 2016-05-10 DIAGNOSIS — Z0000 Encounter for general adult medical examination without abnormal findings: Secondary | ICD-10-CM

## 2016-05-10 NOTE — Patient Instructions (Addendum)
Christine Maldonado , Thank you for taking time to come for your Medicare Wellness Visit. I appreciate your ongoing commitment to your health goals. Please review the following plan we discussed and let me know if I can assist you in the future.   A Tetanus is recommended every 10 years. Medicare covers a tetanus if you have a cut or wound; otherwise, there may be a charge. If you had not had a tetanus with pertusses, known as the Tdap, you can take this anytime.   Will schedule your eye exam when due  May discuss repeat of DEXA scan with Dr. Maudie Mercury in June during your fup apt.   These are the goals we discussed: Goals    . Exercise 150 minutes per week (moderate activity)          Will try to go to exercise class 1030 and 11am for meditation        This is a list of the screening recommended for you and due dates:  Health Maintenance  Topic Date Due  . Flu Shot  08/30/2016  . Mammogram  10/20/2016  . Tetanus Vaccine  07/01/2019  . DEXA scan (bone density measurement)  Addressed  . Pneumonia vaccines  Completed    Prevention of falls: Remove rugs or any tripping hazards in the home Use Non slip mats in bathtubs and showers Placing grab bars next to the toilet and or shower Placing handrails on both sides of the stair way Adding extra lighting in the home.   Personal safety issues reviewed:  1. Consider starting a community watch program per [facility] 2.  Changes batteries is smoke detector and/or carbon monoxide detector  3.  If you have firearms; keep them in a safe place 4.  Wear protection when in the sun; Always wear sunscreen or a hat; It is good to have your doctor check your skin annually or review any new areas of concern 5. Driving safety; Keep in the right lane; stay 3 car lengths behind the car in front of you on the highway; look 3 times prior to pulling out; carry your cell phone everywhere you go!    Learn about the Yellow Dot program:  The program  allows first responders at your emergency to have access to who your physician is, as well as your medications and medical conditions.  Citizens requesting the Yellow Dot Packages should contact Master Corporal Nunzio Cobbs at the [facility] [phone] for the first week of the program and beginning the week after Easter citizens should contact their Scientist, physiological.        Fall Prevention in the Home Falls can cause injuries. They can happen to people of all ages. There are many things you can do to make your home safe and to help prevent falls. What can I do on the outside of my home?  Regularly fix the edges of walkways and driveways and fix any cracks.  Remove anything that might make you trip as you walk through a door, such as a raised step or threshold.  Trim any bushes or trees on the path to your home.  Use bright outdoor lighting.  Clear any walking paths of anything that might make someone trip, such as rocks or tools.  Regularly check to see if handrails are loose or broken. Make sure that both sides of any steps have handrails.  Any raised decks and porches should have guardrails on the edges.  Have  any leaves, snow, or ice cleared regularly.  Use sand or salt on walking paths during winter.  Clean up any spills in your garage right away. This includes oil or grease spills. What can I do in the bathroom?  Use night lights.  Install grab bars by the toilet and in the tub and shower. Do not use towel bars as grab bars.  Use non-skid mats or decals in the tub or shower.  If you need to sit down in the shower, use a plastic, non-slip stool.  Keep the floor dry. Clean up any water that spills on the floor as soon as it happens.  Remove soap buildup in the tub or shower regularly.  Attach bath mats securely with double-sided non-slip rug tape.  Do not have throw rugs and other things on the floor that can make you  trip. What can I do in the bedroom?  Use night lights.  Make sure that you have a light by your bed that is easy to reach.  Do not use any sheets or blankets that are too big for your bed. They should not hang down onto the floor.  Have a firm chair that has side arms. You can use this for support while you get dressed.  Do not have throw rugs and other things on the floor that can make you trip. What can I do in the kitchen?  Clean up any spills right away.  Avoid walking on wet floors.  Keep items that you use a lot in easy-to-reach places.  If you need to reach something above you, use a strong step stool that has a grab bar.  Keep electrical cords out of the way.  Do not use floor polish or wax that makes floors slippery. If you must use wax, use non-skid floor wax.  Do not have throw rugs and other things on the floor that can make you trip. What can I do with my stairs?  Do not leave any items on the stairs.  Make sure that there are handrails on both sides of the stairs and use them. Fix handrails that are broken or loose. Make sure that handrails are as long as the stairways.  Check any carpeting to make sure that it is firmly attached to the stairs. Fix any carpet that is loose or worn.  Avoid having throw rugs at the top or bottom of the stairs. If you do have throw rugs, attach them to the floor with carpet tape.  Make sure that you have a light switch at the top of the stairs and the bottom of the stairs. If you do not have them, ask someone to add them for you. What else can I do to help prevent falls?  Wear shoes that:  Do not have high heels.  Have rubber bottoms.  Are comfortable and fit you well.  Are closed at the toe. Do not wear sandals.  If you use a stepladder:  Make sure that it is fully opened. Do not climb a closed stepladder.  Make sure that both sides of the stepladder are locked into place.  Ask someone to hold it for you, if  possible.  Clearly mark and make sure that you can see:  Any grab bars or handrails.  First and last steps.  Where the edge of each step is.  Use tools that help you move around (mobility aids) if they are needed. These include:  Canes.  Walkers.  Scooters.  Crutches.  Turn on the lights when you go into a dark area. Replace any light bulbs as soon as they burn out.  Set up your furniture so you have a clear path. Avoid moving your furniture around.  If any of your floors are uneven, fix them.  If there are any pets around you, be aware of where they are.  Review your medicines with your doctor. Some medicines can make you feel dizzy. This can increase your chance of falling. Ask your doctor what other things that you can do to help prevent falls. This information is not intended to replace advice given to you by your health care provider. Make sure you discuss any questions you have with your health care provider. Document Released: 11/12/2008 Document Revised: 06/24/2015 Document Reviewed: 02/20/2014 Elsevier Interactive Patient Education  2017 [place] Maintenance, Female Adopting a healthy lifestyle and getting preventive care can go a long way to promote health and wellness. Talk with your health care provider about what schedule of regular examinations is right for you. This is a good chance for you to check in with your provider about disease prevention and staying healthy. In between checkups, there are plenty of things you can do on your own. Experts have done a lot of research about which lifestyle changes and preventive measures are most likely to keep you healthy. Ask your health care provider for more information. Weight and diet Eat a healthy diet  Be sure to include plenty of vegetables, fruits, low-fat dairy products, and lean protein.  Do not eat a lot of foods high in solid fats, added sugars, or salt.  Get regular exercise. This is one of  the most important things you can do for your health.  Most adults should exercise for at least 150 minutes each week. The exercise should increase your heart rate and make you sweat (moderate-intensity exercise).  Most adults should also do strengthening exercises at least twice a week. This is in addition to the moderate-intensity exercise. Maintain a healthy weight  Body mass index (BMI) is a measurement that can be used to identify possible weight problems. It estimates body fat based on height and weight. Your health care provider can help determine your BMI and help you achieve or maintain a healthy weight.  For females 81 years of age and older:  A BMI below 18.5 is considered underweight.  A BMI of 18.5 to 24.9 is normal.  A BMI of 25 to 29.9 is considered overweight.  A BMI of 30 and above is considered obese. Watch levels of cholesterol and blood lipids  You should start having your blood tested for lipids and cholesterol at 81 years of age, then have this test every 5 years.  You may need to have your cholesterol levels checked more often if:  Your lipid or cholesterol levels are high.  You are older than 81 years of age.  You are at high risk for heart disease. Cancer screening Lung Cancer  Lung cancer screening is recommended for adults 40-46 years old who are at high risk for lung cancer because of a history of smoking.  A yearly low-dose CT scan of the lungs is recommended for people who:  Currently smoke.  Have quit within the past 15 years.  Have at least a 30-pack-year history of smoking. A pack year is smoking an average of one pack of cigarettes a day for 1 year.  Yearly screening should continue until it has been 15 years  since you quit.  Yearly screening should stop if you develop a health problem that would prevent you from having lung cancer treatment. Breast Cancer  Practice breast self-awareness. This means understanding how your breasts  normally appear and feel.  It also means doing regular breast self-exams. Let your health care provider know about any changes, no matter how small.  If you are in your 20s or 30s, you should have a clinical breast exam (CBE) by a health care provider every 1-3 years as part of a regular health exam.  If you are 9 or older, have a CBE every year. Also consider having a breast X-ray (mammogram) every year.  If you have a family history of breast cancer, talk to your health care provider about genetic screening.  If you are at high risk for breast cancer, talk to your health care provider about having an MRI and a mammogram every year.  Breast cancer gene (BRCA) assessment is recommended for women who have family members with BRCA-related cancers. BRCA-related cancers include:  Breast.  Ovarian.  Tubal.  Peritoneal cancers.  Results of the assessment will determine the need for genetic counseling and BRCA1 and BRCA2 testing. Cervical Cancer  Your health care provider may recommend that you be screened regularly for cancer of the pelvic organs (ovaries, uterus, and vagina). This screening involves a pelvic examination, including checking for microscopic changes to the surface of your cervix (Pap test). You may be encouraged to have this screening done every 3 years, beginning at age 63.  For women ages 39-65, health care providers may recommend pelvic exams and Pap testing every 3 years, or they may recommend the Pap and pelvic exam, combined with testing for human papilloma virus (HPV), every 5 years. Some types of HPV increase your risk of cervical cancer. Testing for HPV may also be done on women of any age with unclear Pap test results.  Other health care providers may not recommend any screening for nonpregnant women who are considered low risk for pelvic cancer and who do not have symptoms. Ask your health care provider if a screening pelvic exam is right for you.  If you have had  past treatment for cervical cancer or a condition that could lead to cancer, you need Pap tests and screening for cancer for at least 20 years after your treatment. If Pap tests have been discontinued, your risk factors (such as having a new sexual partner) need to be reassessed to determine if screening should resume. Some women have medical problems that increase the chance of getting cervical cancer. In these cases, your health care provider may recommend more frequent screening and Pap tests. Colorectal Cancer  This type of cancer can be detected and often prevented.  Routine colorectal cancer screening usually begins at 81 years of age and continues through 81 years of age.  Your health care provider may recommend screening at an earlier age if you have risk factors for colon cancer.  Your health care provider may also recommend using home test kits to check for hidden blood in the stool.  A small camera at the end of a tube can be used to examine your colon directly (sigmoidoscopy or colonoscopy). This is done to check for the earliest forms of colorectal cancer.  Routine screening usually begins at age 66.  Direct examination of the colon should be repeated every 5-10 years through 81 years of age. However, you may need to be screened more often if early forms  of precancerous polyps or small growths are found. Skin Cancer  Check your skin from head to toe regularly.  Tell your health care provider about any new moles or changes in moles, especially if there is a change in a mole's shape or color.  Also tell your health care provider if you have a mole that is larger than the size of a pencil eraser.  Always use sunscreen. Apply sunscreen liberally and repeatedly throughout the day.  Protect yourself by wearing long sleeves, pants, a wide-brimmed hat, and sunglasses whenever you are outside. Heart disease, diabetes, and high blood pressure  High blood pressure causes heart disease  and increases the risk of stroke. High blood pressure is more likely to develop in:  People who have blood pressure in the high end of the normal range (130-139/85-89 mm Hg).  People who are overweight or obese.  People who are African American.  If you are 54-64 years of age, have your blood pressure checked every 3-5 years. If you are 28 years of age or older, have your blood pressure checked every year. You should have your blood pressure measured twice-once when you are at a hospital or clinic, and once when you are not at a hospital or clinic. Record the average of the two measurements. To check your blood pressure when you are not at a hospital or clinic, you can use:  An automated blood pressure machine at a pharmacy.  A home blood pressure monitor.  If you are between 74 years and 90 years old, ask your health care provider if you should take aspirin to prevent strokes.  Have regular diabetes screenings. This involves taking a blood sample to check your fasting blood sugar level.  If you are at a normal weight and have a low risk for diabetes, have this test once every three years after 81 years of age.  If you are overweight and have a high risk for diabetes, consider being tested at a younger age or more often. Preventing infection Hepatitis B  If you have a higher risk for hepatitis B, you should be screened for this virus. You are considered at high risk for hepatitis B if:  You were born in a country where hepatitis B is common. Ask your health care provider which countries are considered high risk.  Your parents were born in a high-risk country, and you have not been immunized against hepatitis B (hepatitis B vaccine).  You have HIV or AIDS.  You use needles to inject street drugs.  You live with someone who has hepatitis B.  You have had sex with someone who has hepatitis B.  You get hemodialysis treatment.  You take certain medicines for conditions, including  cancer, organ transplantation, and autoimmune conditions. Hepatitis C  Blood testing is recommended for:  Everyone born from [birth year] through 1963.  Anyone with known risk factors for hepatitis C. Sexually transmitted infections (STIs)  You should be screened for sexually transmitted infections (STIs) including gonorrhea and chlamydia if:  You are sexually active and are younger than 81 years of age.  You are older than 81 years of age and your health care provider tells you that you are at risk for this type of infection.  Your sexual activity has changed since you were last screened and you are at an increased risk for chlamydia or gonorrhea. Ask your health care provider if you are at risk.  If you do not have HIV, but are at risk, it  may be recommended that you take a prescription medicine daily to prevent HIV infection. This is called pre-exposure prophylaxis (PrEP). You are considered at risk if:  You are sexually active and do not regularly use condoms or know the HIV status of your partner(s).  You take drugs by injection.  You are sexually active with a partner who has HIV. Talk with your health care provider about whether you are at high risk of being infected with HIV. If you choose to begin PrEP, you should first be tested for HIV. You should then be tested every 3 months for as long as you are taking PrEP. Pregnancy  If you are premenopausal and you may become pregnant, ask your health care provider about preconception counseling.  If you may become pregnant, take 400 to 800 micrograms (mcg) of folic acid every day.  If you want to prevent pregnancy, talk to your health care provider about birth control (contraception). Osteoporosis and menopause  Osteoporosis is a disease in which the bones lose minerals and strength with aging. This can result in serious bone fractures. Your risk for osteoporosis can be identified using a bone density scan.  If you are 74 years of age  or older, or if you are at risk for osteoporosis and fractures, ask your health care provider if you should be screened.  Ask your health care provider whether you should take a calcium or vitamin D supplement to lower your risk for osteoporosis.  Menopause may have certain physical symptoms and risks.  Hormone replacement therapy may reduce some of these symptoms and risks. Talk to your health care provider about whether hormone replacement therapy is right for you. Follow these instructions at home:  Schedule regular health, dental, and eye exams.  Stay current with your immunizations.  Do not use any tobacco products including cigarettes, chewing tobacco, or electronic cigarettes.  If you are pregnant, do not drink alcohol.  If you are breastfeeding, limit how much and how often you drink alcohol.  Limit alcohol intake to no more than 1 drink per day for nonpregnant women. One drink equals 12 ounces of beer, 5 ounces of wine, or 1 ounces of hard liquor.  Do not use street drugs.  Do not share needles.  Ask your health care provider for help if you need support or information about quitting drugs.  Tell your health care provider if you often feel depressed.  Tell your health care provider if you have ever been abused or do not feel safe at home. This information is not intended to replace advice given to you by your health care provider. Make sure you discuss any questions you have with your health care provider. Document Released: 08/01/2010 Document Revised: 06/24/2015 Document Reviewed: 10/20/2014 Elsevier Interactive Patient Education  2017 [employer]   Hearing Loss Hearing loss is a partial or total loss of the ability to hear. This can be temporary or permanent, and it can happen in one or both ears. Hearing loss may be referred to as deafness. Medical care is necessary to treat hearing loss properly and to prevent the condition from getting worse. Your hearing may  partially or completely come back, depending on what caused your hearing loss and how severe it is. In some cases, hearing loss is permanent. What are the causes? Common causes of hearing loss include:  Too much wax in the ear canal.  Infection of the ear canal or middle ear.  Fluid in the middle ear.  Injury  to the ear or surrounding area.  An object stuck in the ear.  Prolonged exposure to loud sounds, such as music. Less common causes of hearing loss include:  Tumors in the ear.  Viral or bacterial infections, such as meningitis.  A hole in the eardrum (perforated eardrum).  Problems with the hearing nerve that sends signals between the brain and the ear.  Certain medicines. What are the signs or symptoms? Symptoms of this condition may include:  Difficulty telling the difference between sounds.  Difficulty following a conversation when there is background noise.  Lack of response to sounds in your environment. This may be most noticeable when you do not respond to startling sounds.  Needing to turn up the volume on the television, radio, etc.  Ringing in the ears.  Dizziness.  Pain in the ears. How is this diagnosed? This condition is diagnosed based on a physical exam and a hearing test (audiometry). The audiometry test will be performed by a hearing specialist (audiologist). You may also be referred to an ear, nose, and throat (ENT) specialist (otolaryngologist). How is this treated? Treatment for recent onset of hearing loss may include:  Ear wax removal.  Being prescribed medicines to prevent infection (antibiotics).  Being prescribed medicines to reduce inflammation (corticosteroids). Follow these instructions at home:  If you were prescribed an antibiotic medicine, take it as told by your health care provider. Do not stop taking the antibiotic even if you start to feel better.  Take over-the-counter and prescription medicines only as told by your  health care provider.  Avoid loud noises.  Return to your normal activities as told by your health care provider. Ask your health care provider what activities are safe for you.  Keep all follow-up visits as told by your health care provider. This is important. Contact a health care provider if:  You feel dizzy.  You develop new symptoms.  You vomit or feel nauseous.  You have a fever. Get help right away if:  You develop sudden changes in your vision.  You have severe ear pain.  You have new or increased weakness.  You have a severe headache. This information is not intended to replace advice given to you by your health care provider. Make sure you discuss any questions you have with your health care provider. Document Released: 01/16/2005 Document Revised: 06/24/2015 Document Reviewed: 06/03/2014 Elsevier Interactive Patient Education  2017 [employer].

## 2016-06-21 DIAGNOSIS — R69 Illness, unspecified: Secondary | ICD-10-CM | POA: Diagnosis not present

## 2016-07-03 ENCOUNTER — Telehealth: Payer: Self-pay | Admitting: Family Medicine

## 2016-07-03 ENCOUNTER — Encounter: Payer: Self-pay | Admitting: Family Medicine

## 2016-07-03 ENCOUNTER — Ambulatory Visit (INDEPENDENT_AMBULATORY_CARE_PROVIDER_SITE_OTHER): Payer: Medicare HMO | Admitting: Family Medicine

## 2016-07-03 VITALS — BP 100/60 | HR 94 | Temp 98.2°F | Ht 66.0 in | Wt 195.2 lb

## 2016-07-03 DIAGNOSIS — F3342 Major depressive disorder, recurrent, in full remission: Secondary | ICD-10-CM

## 2016-07-03 DIAGNOSIS — R21 Rash and other nonspecific skin eruption: Secondary | ICD-10-CM

## 2016-07-03 DIAGNOSIS — R739 Hyperglycemia, unspecified: Secondary | ICD-10-CM | POA: Diagnosis not present

## 2016-07-03 DIAGNOSIS — I10 Essential (primary) hypertension: Secondary | ICD-10-CM | POA: Diagnosis not present

## 2016-07-03 DIAGNOSIS — R69 Illness, unspecified: Secondary | ICD-10-CM | POA: Diagnosis not present

## 2016-07-03 LAB — CBC
HCT: 41.8 % (ref 36.0–46.0)
Hemoglobin: 14 g/dL (ref 12.0–15.0)
MCHC: 33.5 g/dL (ref 30.0–36.0)
MCV: 90.5 fl (ref 78.0–100.0)
Platelets: 276 10*3/uL (ref 150.0–400.0)
RBC: 4.62 Mil/uL (ref 3.87–5.11)
RDW: 13.2 % (ref 11.5–15.5)
WBC: 6.1 10*3/uL (ref 4.0–10.5)

## 2016-07-03 LAB — BASIC METABOLIC PANEL
BUN: 24 mg/dL — AB (ref 6–23)
CALCIUM: 9.6 mg/dL (ref 8.4–10.5)
CO2: 34 meq/L — AB (ref 19–32)
CREATININE: 0.89 mg/dL (ref 0.40–1.20)
Chloride: 95 mEq/L — ABNORMAL LOW (ref 96–112)
GFR: 63.93 mL/min (ref >60.00)
GLUCOSE: 96 mg/dL (ref 70–99)
Potassium: 4.1 mEq/L (ref 3.5–5.1)
Sodium: 135 mEq/L (ref 135–145)

## 2016-07-03 MED ORDER — TRIAMCINOLONE ACETONIDE 0.025 % EX CREA: 1.0000 "application " | TOPICAL_CREAM | Freq: Two times a day (BID) | CUTANEOUS | 0 refills | Status: DC

## 2016-07-03 NOTE — Progress Notes (Signed)
HPI:  Christine Maldonado is a pleasant 83 y.o. here for follow up. Chronic medical problems summarized below were reviewed for changes and stability and were updated as needed below. These issues and their treatment remain stable for the most part. She sees a dermatologist, but wants my thoughts on an itchy rash on her chest. Reports has had for a few weeks. She also is quite upset by the fact that our wellness coordninator schedule her a wellness visit too early, so she ended up getting charged for it.  Denies CP, SOB, DOE, treatment intolerance or new symptoms.  Due for labs.  HTN: -meds: hctz  MDD: -effexor  Diet controlled Prediabetes: -no meds  Seasonal allergies: -meds: allegra  Seb derm scalp: -sees dermatologist  Sees Dr. Sabra Heck in gyn. Sees Dr. Latanya Maudlin for hammer toe.  ROS: See pertinent positives and negatives per HPI.  Past Medical History:  Diagnosis Date  . Anemia   . Bleeding 1993   BTB on HRT-endo biopsy proe with focal breakdown  . Bleeding 1995   BTB on HRT-benign endo biopsy  . Breast cyst 12/95   right breast  . Colon polyps   . Depression   . History of breast cancer 2008   DCIS/invasive ductal cancer right breast ER+/PR+  . Hot flashes 02/09/2014  . Hyperglycemia 02/09/2014   Prediabetes labs 03/2013 at [facility]   . Hypertension   . Migraine   . Osteopenia   . Piriformis muscle pain   . Plantar fasciitis   . PUD (peptic ulcer disease) 8/94   Dr Magod-endoscopy  . Seasonal allergies   . Squamous cell carcinoma 6/11 7/11   on leg-removed  . Ulcer   . UTI (urinary tract infection)     Past Surgical History:  Procedure Laterality Date  . BREAST LUMPECTOMY Right 06/06/2006   right inner quadrant - invasive with sentinel node biopsy, ER/ PR +;  Her2 Nu negtive  . DILATION AND CURETTAGE OF UTERUS  50+ years ago   for retained placenta  . DILATION AND CURETTAGE OF UTERUS  1986  . KNEE ARTHROSCOPY  2016   Dr. Novella Olive    Family History   Problem Relation Age of Onset  . Ovarian cancer Mother   . Hypertension Mother   . Arthritis Father   . Heart attack Brother   . Diabetes Other        granddaughter  . Kidney failure Sister     Social History   Social History  . Marital status: Widowed    Spouse name: N/A  . Number of children: N/A  . Years of education: N/A   Social History Main Topics  . Smoking status: Former Smoker    Packs/day: 1.00    Years: 15.00    Quit date: 06/27/1965  . Smokeless tobacco: Never Used     Comment: smoked in the [employer]; 15 year approx  . Alcohol use No  . Drug use: No  . Sexual activity: No   Other Topics Concern  . 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  Ascorbic Acid (VITAMIN C PO), Take 1,000 mg by mouth daily. ,  Disp: , Rfl:  .  CALCIUM PO, Take 600 mg by mouth daily. , Disp: , Rfl:  .  Coenzyme Q10 (CO Q 10 PO), Take by mouth daily., Disp: , Rfl:  .  CRANBERRY PO, Take 84 mg by mouth daily. , Disp: , Rfl:  .  docusate sodium (COLACE) 100 MG capsule, Take 200 mg by mouth at bedtime. , Disp: , Rfl:  .  Fexofenadine HCl (ALLEGRA PO), Take 0.5 tablets by mouth 2 (two) times daily. , Disp: , Rfl:  .  fish oil-omega-3 fatty acids 1000 MG capsule, Take 1 g by mouth daily. , Disp: , Rfl:  .  Garlic 2694 MG CAPS, Take 1,000 mg by mouth daily., Disp: , Rfl:  .  hydrochlorothiazide (HYDRODIURIL) 25 MG tablet, TAKE ONE TABLET BY MOUTH ONCE DAILY, Disp: 90 tablet, Rfl: 1 .  Multiple Vitamin (MULTIVITAMIN) capsule, Take 1 capsule by mouth daily.  , Disp: , Rfl:  .  NON FORMULARY, Tumeric, Disp: , Rfl:  .  Probiotic Product (PROBIOTIC DAILY PO), Take by mouth daily., Disp: , Rfl:  .  venlafaxine (EFFEXOR) 75 MG tablet, Take 1 tablet (75 mg  total) by mouth 2 (two) times daily with a meal., Disp: 180 tablet, Rfl: 1 .  VITAMIN D, CHOLECALCIFEROL, PO, Take 4,000 Int'l Units by mouth. , Disp: , Rfl:  .  triamcinolone (KENALOG) 0.025 % cream, Apply 1 application topically 2 (two) times daily., Disp: 30 g, Rfl: 0  EXAM:  Vitals:   07/03/16 0918  BP: 100/60  Pulse: 94  Temp: 98.2 F (36.8 C)    Body mass index is 31.51 kg/m.  GENERAL: vitals reviewed and listed above, alert, oriented, appears well hydrated and in no acute distress  HEENT: atraumatic, conjunttiva clear, no obvious abnormalities on inspection of external nose and ears  NECK: no obvious masses on inspection  LUNGS: clear to auscultation bilaterally, no wheezes, rales or rhonchi, good air movement  CV: HRRR, no peripheral edema  MS: moves all extremities without noticeable abnormality  SKIN: maculopapular erythematous rash on the chest at shirt line  PSYCH: pleasant and cooperative, no obvious depression or anxiety  ASSESSMENT AND PLAN:  Discussed the following assessment and plan:  Essential hypertension, benign - Plan: Basic metabolic panel, CBC  Hyperglycemia  Recurrent major depressive disorder, in full remission (HCC)  Skin rash  -skin rash looks like heat or contact dermatitis - she opted to try mild steroid cream and follow up with dermatologist if persists was advised -labs today -lifestyle recs -had practice admin assistant check on the wellness visit charge which seems like and office error - I was told this charge was an error with our wellness visit system and that it was corrected and pt has not paid, apologized to pt and had my admin discuss with her to ensure resolution - I am not sure why this happened as my notes instructed her to schedule this at the appropriate time, yet it was scheduled early -Patient advised to return or notify a doctor immediately if symptoms worsen or persist or new concerns arise.  Patient Instructions   BEFORE YOU LEAVE: -follow up: 3-4 months -labs  Try the cream that I sent to the pharmacy 1-2 times daily on the rash. See your dermatologist if this persists in 2 weeks.  We have ordered labs or studies at this visit. It can take up to 1-2 weeks for results and processing. IF results require follow up or explanation, we will call you  with instructions. Clinically stable results will be released to your [facility]. If you have not heard from [place] or cannot find your results in [facility] in 2 weeks please contact our office at [phone].  If you are not yet signed up for [facility], please consider signing up.  Advise regular aerobic exercise (at least 150 minutes per week of sweaty exercise) and a healthy diet. Try to eat at least 5-9 servings of vegetables and fruits per day (not corn, potatoes or bananas.) Avoid sweets, red meat, pork, butter, fried foods, fast food, processed food, excessive dairy, eggs and coconut. Replace bad fats with good fats - fish, nuts and seeds, canola oil, olive oil.           Colin Benton R., DO

## 2016-07-03 NOTE — Telephone Encounter (Signed)
[employer] on patient's behalf and spoke to [place] who advised they had filed the patient's 02/07/16 visit under [facility] [employer] instead of the under the provider.  Eli advised that that visit would be re-filed and the patient should be reimbursed for the monies paid for that visit.  The reference # is [id number].  I called and left a voicemail to notify the patient of this.  In addition, I notified the patient that the AWV that was performed early was also taken care of.

## 2016-07-03 NOTE — Patient Instructions (Signed)
BEFORE YOU LEAVE: -follow up: 3-4 months -labs  Try the cream that I sent to the pharmacy 1-2 times daily on the rash. See your dermatologist if this persists in 2 weeks.  We have ordered labs or studies at this visit. It can take up to 1-2 weeks for results and processing. IF results require follow up or explanation, we will call you with instructions. Clinically stable results will be released to your [facility]. If you have not heard from [place] or cannot find your results in [facility] in 2 weeks please contact our office at [phone].  If you are not yet signed up for [facility], please consider signing up.  Advise regular aerobic exercise (at least 150 minutes per week of sweaty exercise) and a healthy diet. Try to eat at least 5-9 servings of vegetables and fruits per day (not corn, potatoes or bananas.) Avoid sweets, red meat, pork, butter, fried foods, fast food, processed food, excessive dairy, eggs and coconut. Replace bad fats with good fats - fish, nuts and seeds, canola oil, olive oil.

## 2016-07-03 NOTE — Telephone Encounter (Signed)
THNKS! You rock!

## 2016-07-14 DIAGNOSIS — G43909 Migraine, unspecified, not intractable, without status migrainosus: Secondary | ICD-10-CM | POA: Diagnosis not present

## 2016-07-14 DIAGNOSIS — H5201 Hypermetropia, right eye: Secondary | ICD-10-CM | POA: Diagnosis not present

## 2016-07-14 DIAGNOSIS — H26491 Other secondary cataract, right eye: Secondary | ICD-10-CM | POA: Diagnosis not present

## 2016-07-21 DIAGNOSIS — G43909 Migraine, unspecified, not intractable, without status migrainosus: Secondary | ICD-10-CM | POA: Diagnosis not present

## 2016-08-22 ENCOUNTER — Telehealth: Payer: Self-pay | Admitting: Obstetrics & Gynecology

## 2016-08-22 NOTE — Telephone Encounter (Signed)
Left message on voicemail to call and reschedule cancelled appointment. Mail letter °

## 2016-08-29 ENCOUNTER — Telehealth: Payer: Self-pay | Admitting: Obstetrics & Gynecology

## 2016-08-29 NOTE — Telephone Encounter (Signed)
Patient received the letter from our office regarding needing to reschedule due to Edman [address] retiring from our office. Shaquina said " Dr. Sabra Heck told me once that at certain age I no longer need these appointments." "Have I reached this age"?

## 2016-08-29 NOTE — Telephone Encounter (Signed)
Call to patient. Patient states years ago, Dr. Sabra Heck told her at "some point" she did not need to receive pap smears anymore and "I'm now 88." Last aex 03/06/16 with Kem Boroughs, FNP. Patient agreeable to scheduling aex for 2019. Aex scheduled for Monday 03/12/17 at 1045 with Dr. Sabra Heck. Patient agreeable to date and time of appointment. Patient aware Dr. Sabra Heck will review at her aex when last pap was and will recommend when/if she needs to have any pap smears in the future.   Routing to provider for final review. Patient agreeable to disposition. Will close encounter.

## 2016-08-30 ENCOUNTER — Encounter: Payer: Self-pay | Admitting: Internal Medicine

## 2016-08-30 ENCOUNTER — Non-Acute Institutional Stay: Payer: Medicare HMO | Admitting: Internal Medicine

## 2016-08-30 VITALS — BP 124/80 | HR 82 | Temp 97.6°F | Resp 18 | Ht 66.0 in | Wt 198.4 lb

## 2016-08-30 DIAGNOSIS — R351 Nocturia: Secondary | ICD-10-CM

## 2016-08-30 DIAGNOSIS — E669 Obesity, unspecified: Secondary | ICD-10-CM | POA: Diagnosis not present

## 2016-08-30 DIAGNOSIS — M791 Myalgia: Secondary | ICD-10-CM | POA: Diagnosis not present

## 2016-08-30 DIAGNOSIS — Z853 Personal history of malignant neoplasm of breast: Secondary | ICD-10-CM | POA: Diagnosis not present

## 2016-08-30 DIAGNOSIS — M7918 Myalgia, other site: Secondary | ICD-10-CM

## 2016-08-30 DIAGNOSIS — F3342 Major depressive disorder, recurrent, in full remission: Secondary | ICD-10-CM | POA: Diagnosis not present

## 2016-08-30 DIAGNOSIS — R69 Illness, unspecified: Secondary | ICD-10-CM | POA: Diagnosis not present

## 2016-08-30 DIAGNOSIS — I10 Essential (primary) hypertension: Secondary | ICD-10-CM | POA: Diagnosis not present

## 2016-08-30 DIAGNOSIS — J301 Allergic rhinitis due to pollen: Secondary | ICD-10-CM | POA: Diagnosis not present

## 2016-08-30 NOTE — Progress Notes (Signed)
[facility]  Provider: Blanchie Serve MD   Location:  [place] of Service:  Clinic (12)  PCP: Blanchie Serve, MD Patient Care Team: Blanchie Serve, MD as PCP - General (Internal Medicine) Ngetich, Nelda Bucks, NP as Nurse Practitioner (Family Medicine)  Extended Emergency Contact Information Primary Emergency Contact: [facility] Address: [place] [address] Johnnette Litter of [place] Phone: [phone] Relation: Daughter  Code Status:   Goals of Care: Advanced Directive information No flowsheet data found.    Chief Complaint  Patient presents with  . New Patient (Initial Visit)    Here to establish care with [place]. Patient has concerns about back aches that comes and goes. She also has concerns about increased urinary frequency at night.  . Medication Refill    no refills needed at this time.    HPI: Patient is a 83 y.o. female seen today to establish visit. She was seeing Dr Maudie Mercury prior to this as her PCP for few years. She follows with ophthalmologist Dr Prudencio Burly, dermatology Dr Renda Rolls, gynecologist Dr Sabra Heck and orthopedic Dr Latanya Maudlin. She has medical history of HTN, allergic rhinitis, history of breast cancer and depression. She is compliant with her medications. Reviewed last OV note from Dr Julianne Rice office.   Nocturia- She complaints of nocturia. She wakes up at least 2 times at night to urinate. Denies any dysuria or pain with urination. Denies hematuria. Denies increased urine frequency during daytime. Denies urgency during daytime.   Left back pain- intermittent to left buttock area, non radiating, exercises have helped some in past. Denies pain at present.   Past Medical History:  Diagnosis Date  . Anemia   . Bleeding 1993   BTB on HRT-endo biopsy proe with focal breakdown  . Bleeding 1995   BTB on HRT-benign endo biopsy  . Breast cyst 12/95   right breast  . Colon polyps   . Depression   .  History of breast cancer 2008   DCIS/invasive ductal cancer right breast ER+/PR+  . Hot flashes 02/09/2014  . Hyperglycemia 02/09/2014   Prediabetes labs 03/2013 at [facility]   . Hypertension   . Migraine   . Osteopenia   . Piriformis muscle pain   . Plantar fasciitis   . PUD (peptic ulcer disease) 8/94   Dr Magod-endoscopy  . Seasonal allergies   . Squamous cell carcinoma 6/11 7/11   on leg-removed  . Ulcer   . UTI (urinary tract infection)    Past Surgical History:  Procedure Laterality Date  . BREAST LUMPECTOMY Right 06/06/2006   right inner quadrant - invasive with sentinel node biopsy, ER/ PR +;  Her2 Nu negtive  . DILATION AND CURETTAGE OF UTERUS  50+ years ago   for retained placenta  . DILATION AND CURETTAGE OF UTERUS  1986  . KNEE ARTHROSCOPY  2016   Dr. Novella Olive    reports that she quit smoking about 51 years ago. She has a 15.00 pack-year smoking history. She has never used smokeless tobacco. She reports that she does not drink alcohol or use drugs. Social History   Social History  . Marital status: Widowed    Spouse name: N/A  . Number of children: N/A  . Years of education: N/A   Occupational History  . Not on file.   Social History Main Topics  . Smoking status: Former Smoker    Packs/day: 1.00  Years: 15.00    Quit date: 06/27/1965  . Smokeless tobacco: Never Used     Comment: smoked in the [employer]; 15 year approx  . Alcohol use No  . Drug use: No  . Sexual activity: No   Other Topics Concern  . Not on file   Social History Narrative   Tobacco use, amount per day now: 0      Past tobacco use, amount per day: Carton      How many years did you use tobacco: [place]      Alcohol use (drinks per week): 0      Diet:      Do you drink/eat things with caffeine? Yes      Marital status:             What year were you married?      Do you live in a house, apartment, assisted living, condo, trailer? Apartment       Is it one or more stories? Yes         How many persons live in your home?      Do you have any pets in your home? 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  Yes(walking)             How often?      Do you have a living will? Yes      Do you have a DNR form?  Yes          If not, do you want to discuss one?      Do you have signed POA/HPOA forms?   Yes                           Functional Status Survey:    Family History  Problem Relation Age of Onset  . Ovarian cancer Mother   . Hypertension Mother   . Arthritis Father   . Cancer Father   . Heart attack Brother   . Diabetes Other        granddaughter  . Kidney failure Sister     Health Maintenance  Topic Date Due  . INFLUENZA VACCINE  08/30/2016  . MAMMOGRAM  10/20/2016  . TETANUS/TDAP  07/01/2019  . DEXA SCAN  Addressed  . PNA vac Low Risk Adult  Completed    Allergies  Allergen Reactions  . Amoxicillin     GI upset  . Asa [Aspirin]     Had ulcer/ told not to take  . Erythrocin   . Diflucan [Fluconazole] Rash  . Nitrofuran Derivatives Rash    Outpatient Encounter Prescriptions as of 08/30/2016  Medication Sig  . Ascorbic Acid (VITAMIN C PO) Take 1,000 mg by mouth daily.   Marland Kitchen CALCIUM PO Take 600 mg by mouth daily.   . Cholecalciferol 1000 units capsule Take 2,000 Units by mouth daily.  . Coenzyme Q10 (CO Q 10 PO) Take by mouth daily.  Marland Kitchen CRANBERRY PO Take 84 mg by mouth daily.   Marland Kitchen docusate sodium (COLACE) 100 MG capsule Take 200 mg by mouth at bedtime.   . fexofenadine (ALLEGRA) 180 MG tablet Take 180 mg by mouth daily.  . Garlic 6761 MG CAPS Take 1,000 mg by mouth daily.  . hydrochlorothiazide (HYDRODIURIL) 25 MG tablet TAKE ONE TABLET BY MOUTH ONCE DAILY  . Multiple Vitamin (MULTIVITAMIN) capsule Take 1 capsule by  mouth daily.    . NON FORMULARY Tumeric  . Omega-3 Fatty Acids (FISH OIL) 1200 MG CAPS Take 1 capsule by mouth daily.  . Probiotic Product (PROBIOTIC DAILY PO) Take by mouth daily.  Marland Kitchen  venlafaxine (EFFEXOR) 75 MG tablet Take 1 tablet (75 mg total) by mouth 2 (two) times daily with a meal.   No facility-administered encounter medications on file as of 08/30/2016.     Review of Systems  Constitutional: Positive for unexpected weight change. Negative for appetite change, chills, diaphoresis and fever.       Has gained 10 lbs since arrival to the facility over 2 years. She has sedentary lifestyle.  HENT: Positive for congestion, postnasal drip and voice change. Negative for ear discharge, ear pain, hearing loss, mouth sores, nosebleeds, rhinorrhea, sinus pain, sinus pressure, sore throat, tinnitus and trouble swallowing.        Has allergic rhinitis  Eyes:       Follows with eye doctor yearly. No known glaucoma or macular degeneration. History of cataract surgery to both eyes  Respiratory: Positive for wheezing. Negative for cough and shortness of breath.        Smoker in past   Cardiovascular: Negative for chest pain, palpitations and leg swelling.  Gastrointestinal: Positive for constipation. Negative for abdominal pain, blood in stool, diarrhea, nausea and vomiting.       Stool softner helps  Genitourinary: Negative for dysuria, frequency, hematuria, urgency, vaginal bleeding and vaginal discharge.  Musculoskeletal: Positive for arthralgias. Negative for back pain, gait problem and joint swelling.       Left buttock pain. History of osteopenia. Followed by orthopedics in past for hammer toe to left foot.  Skin: Negative for rash.  Neurological: Negative for dizziness, seizures, syncope, numbness and headaches.  Hematological: Bruises/bleeds easily.  Psychiatric/Behavioral: Negative for behavioral problems, confusion, self-injury and suicidal ideas. The patient is not nervous/anxious.     Vitals:   08/30/16 0957  BP: 124/80  Pulse: 82  Resp: 18  Temp: 97.6 F (36.4 C)  TempSrc: Oral  SpO2: 99%  Weight: 198 lb 6.4 oz (90 kg)  Height: _0  (1.676 m)   Body mass  index is 32.02 kg/m. Physical Exam  Constitutional: She is oriented to person, place, and time. She appears well-developed.  obese  HENT:  Head: Normocephalic and atraumatic.  Nose: Nose normal.  Mouth/Throat: Oropharynx is clear and moist. No oropharyngeal exudate.  Eyes: Pupils are equal, round, and reactive to light. Conjunctivae and EOM are normal. Right eye exhibits no discharge. Left eye exhibits no discharge.  Neck: Normal range of motion. Neck supple. No thyromegaly present.  Cardiovascular: Normal rate and regular rhythm.   Pulmonary/Chest: Effort normal and breath sounds normal. No respiratory distress. She has no wheezes. She has no rales.  Abdominal: Soft. Bowel sounds are normal. She exhibits no mass. There is no tenderness. There is no rebound and no guarding.  Musculoskeletal: Normal range of motion. She exhibits no edema.  Lymphadenopathy:    She has no cervical adenopathy.  Neurological: She is alert and oriented to person, place, and time. No cranial nerve deficit.  Skin: Skin is warm and dry. No rash noted. She is not diaphoretic. No erythema. No pallor.  Psychiatric: She has a normal mood and affect. Her behavior is normal. Thought content normal.    Labs reviewed: Basic Metabolic Panel:  Recent Labs  01/12/16 0857 07/03/16 1020  NA 138 135  K 3.7 4.1  CL 99 95*  CO2 33* 34*  GLUCOSE 93 96  BUN 21 24*  CREATININE 0.84 0.89  CALCIUM 8.7 9.6   Liver Function Tests: No results for input(s): AST, ALT, ALKPHOS, BILITOT, PROT, ALBUMIN in the last 8760 hours. No results for input(s): LIPASE, AMYLASE in the last 8760 hours. No results for input(s): AMMONIA in the last 8760 hours. CBC:  Recent Labs  01/12/16 0857 07/03/16 1020  WBC 5.0 6.1  HGB 13.2 14.0  HCT 39.7 41.8  MCV 90.5 90.5  PLT 252.0 276.0   Cardiac Enzymes: No results for input(s): CKTOTAL, CKMB, CKMBINDEX, TROPONINI in the last 8760 hours. BNP: Invalid input(s): POCBNP Lab Results    Component Value Date   HGBA1C 6.1 01/12/2016   No results found for: TSH No results found for: VITAMINB12 No results found for: FOLATE No results found for: IRON, TIBC, FERRITIN  Lipid Panel:  Recent Labs  01/12/16 0857  CHOL 216*  HDL 83.50  LDLCALC 116*  TRIG 83.0  CHOLHDL 3   Lab Results  Component Value Date   HGBA1C 6.1 01/12/2016    Procedures since last visit: No results found.  Assessment/Plan  1. Nocturia Ongoing for several months. Will try behavioral modification for now.  Avoid fluid intake after 7 pm and takes small sips of water with medication if needed. Cut down on caffeine intake. If no improvement, consider medication  2. Essential hypertension, benign Continue HCTZ for now. Reviewed recent BMP - TSH; Future - Lipid Panel; Future - Hemoglobin A1c; Future  3. Allergic rhinitis due to pollen, unspecified seasonality Continue allegra  4. Recurrent major depressive disorder, in full remission ([place]) Stable mood, continue effexor.   5. hx: breast cancer, right IDC receptor + her 2 - In remission, stable  6. Left buttock pain No radiculopathy. No pain at present. Normal ROM. Monitor clinically. On ca and vit d supplement  7. Obesity (BMI 30.0-34.9) Reviewed BMI. Check thyroid panel, a1c and lipid panel given her age, history of HTN and obesity to rule out metabolic syndrome. Low salt diet advised. Advised on exercise for 30 minutes, 5 days a week.  - TSH; Future - Lipid Panel; Future - Hemoglobin A1c; Future     Labs/tests ordered:  As above  Next appointment: 3 months  Communication: reviewed care plan with patient  Spent 60 minutes with patient of which more than 50% related to direct patient care.     Blanchie Serve, MD Internal Medicine [facility] [address] [place], [place] [address] Cell Phone (Monday-Friday 8 am - 5 pm): [phone] On Call: [phone] and follow prompts after 5 pm  and on weekends Office Phone: [phone] Office Fax: [phone]

## 2016-09-19 ENCOUNTER — Other Ambulatory Visit: Payer: Self-pay | Admitting: Family Medicine

## 2016-09-19 DIAGNOSIS — Z1231 Encounter for screening mammogram for malignant neoplasm of breast: Secondary | ICD-10-CM

## 2016-09-29 ENCOUNTER — Encounter: Payer: Self-pay | Admitting: Internal Medicine

## 2016-10-04 NOTE — Progress Notes (Signed)
HPI:  Christine Maldonado is a pleasant 83 y.o. here for follow up. Chronic medical problems summarized below were reviewed for changes. She had consider changing to a provider at her living community, however today she reports she wants to transfer back to me. She has several new concerns today including a chronic cough and right buttock pain - see below for details. Otherwise her chronic conditions are fairly stable, she is due for labs today. Denies CP, SOB, DOE, treatment intolerance or new symptoms.  Chronic cough: - started about 9 months ago -Symptoms include intermittent cough, drainage in the throat -Denies: fevers, hemoptysis, shortness of breath, acid reflux symptoms -She does have a history of allergies and is taking an antihistamine daily  left buttock pain: -For several months -Reports remote history of piriformis syndrome -Pain is in the upper buttock region, worse with certain activities -Doing okay today -Occasional radiation to the upper leg  HTN: -meds: hctz  MDD: -meds: effexor  Diet controlled Prediabetes: -no meds  Seasonal allergies: -meds: allegra  Seb derm scalp: -sees dermatologist  Sees Dr. Sabra Heck in gyn. Sees Dr. Latanya Maudlin for hammer toe.  ROS: See pertinent positives and negatives per HPI.  Past Medical History:  Diagnosis Date  . Anemia   . Bleeding 1993   BTB on HRT-endo biopsy proe with focal breakdown  . Bleeding 1995   BTB on HRT-benign endo biopsy  . Breast cyst 12/95   right breast  . Colon polyps   . Depression   . History of breast cancer 2008   DCIS/invasive ductal cancer right breast ER+/PR+  . Hot flashes 02/09/2014  . Hyperglycemia 02/09/2014   Prediabetes labs 03/2013 at [facility]   . Hypertension   . Migraine   . Osteopenia   . Piriformis muscle pain   . Plantar fasciitis   . PUD (peptic ulcer disease) 8/94   Dr Magod-endoscopy  . Seasonal allergies   . Squamous cell carcinoma 6/11 7/11   on leg-removed  . Ulcer   .  UTI (urinary tract infection)     Past Surgical History:  Procedure Laterality Date  . BREAST LUMPECTOMY Right 06/06/2006   right inner quadrant - invasive with sentinel node biopsy, ER/ PR +;  Her2 Nu negtive  . DILATION AND CURETTAGE OF UTERUS  50+ years ago   for retained placenta  . DILATION AND CURETTAGE OF UTERUS  1986  . KNEE ARTHROSCOPY  2016   Dr. Novella Olive    Family History  Problem Relation Age of Onset  . Ovarian cancer Mother   . Hypertension Mother   . Arthritis Father   . Cancer Father   . Heart attack Brother   . Diabetes Other        granddaughter  . Kidney failure Sister     Social History   Social History  . Marital status: Widowed    Spouse name: N/A  . Number of children: N/A  . Years of education: N/A   Social History Main Topics  . Smoking status: Former Smoker    Packs/day: 1.00    Years: 15.00    Quit date: 06/27/1965  . Smokeless tobacco: Never Used     Comment: smoked in the [employer]; 15 year approx  . Alcohol use No  . Drug use: No  . Sexual activity: No   Other Topics Concern  . None   Social History Narrative   Tobacco use, amount per day now: 0      Past tobacco  use, amount per day: Carton      How many years did you use tobacco: [place]      Alcohol use (drinks per week): 0      Diet:      Do you drink/eat things with caffeine? Yes      Marital status:             What year were you married?      Do you live in a house, apartment, assisted living, condo, trailer? Apartment       Is it one or more stories? Yes       How many persons live in your home?      Do you have any pets in your home? 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  Yes(walking)             How often?      Do you have a living will? Yes      Do you have a DNR form?  Yes          If not, do you want to discuss one?      Do you have signed POA/HPOA forms?   Yes                            Current Outpatient  Prescriptions:  .  Ascorbic Acid (VITAMIN C PO), Take 1,000 mg by mouth daily. , Disp: , Rfl:  .  CALCIUM PO, Take 600 mg by mouth daily. , Disp: , Rfl:  .  Cholecalciferol 1000 units capsule, Take 2,000 Units by mouth daily., Disp: , Rfl:  .  Coenzyme Q10 (CO Q 10 PO), Take by mouth daily., Disp: , Rfl:  .  CRANBERRY PO, Take 84 mg by mouth daily. , Disp: , Rfl:  .  docusate sodium (COLACE) 100 MG capsule, Take 200 mg by mouth at bedtime. , Disp: , Rfl:  .  fexofenadine (ALLEGRA) 180 MG tablet, Take 180 mg by mouth daily., Disp: , Rfl:  .  Garlic 0092 MG CAPS, Take 1,000 mg by mouth daily., Disp: , Rfl:  .  hydrochlorothiazide (HYDRODIURIL) 25 MG tablet, TAKE ONE TABLET BY MOUTH ONCE DAILY, Disp: 90 tablet, Rfl: 1 .  Multiple Vitamin (MULTIVITAMIN) capsule, Take 1 capsule by mouth daily.  , Disp: , Rfl:  .  NON FORMULARY, Tumeric, Disp: , Rfl:  .  Omega-3 Fatty Acids (FISH OIL) 1200 MG CAPS, Take 1 capsule by mouth daily., Disp: , Rfl:  .  Probiotic Product (PROBIOTIC DAILY PO), Take by mouth daily., Disp: , Rfl:  .  venlafaxine (EFFEXOR) 75 MG tablet, Take 1 tablet (75 mg total) by mouth 2 (two) times daily with a meal., Disp: 180 tablet, Rfl: 1  EXAM:  Vitals:   10/05/16 0945  BP: 110/70  Pulse: 91  Temp: 98.5 F (36.9 C)    Body mass index is 31.81 kg/m.  GENERAL: vitals reviewed and listed above, alert, oriented, appears well hydrated and in no acute distress  HEENT: atraumatic, conjunttiva clear, no obvious abnormalities on inspection of external nose and ears, normal appearance of ear canals and TMs, clear nasal congestion, mild post oropharyngeal erythema with PND, no tonsillar edema or exudate, no sinus TTP  NECK: no obvious masses on inspection  LUNGS: clear to auscultation bilaterally, no wheezes, rales or rhonchi, good air movement  CV:  HRRR, no peripheral edema  MS: moves all extremities without noticeable abnormality, normal gross inspection of the back, buttocks,  hips and lower extremities; she has tenderness to palpation in the  Left L3 tender point in gluteal musculature,L3 to 5 rotated right side bent left, normal gait, normal strength and sensitivity light touch in lower extremities bilaterally, no bony tenderness to palpation, neurovascularly intact distally  PSYCH: pleasant and cooperative, no obvious depression or anxiety  ASSESSMENT AND PLAN:  Discussed the following assessment and plan:  Chronic cough -we discussed possible serious and likely etiologies, workup and treatment, treatment risks and return precautions -after this discussion, Christine Maldonado opted for CXR to exclude lung pathology, start trial of INS as in her case postnatal drip is the most likely etiology, follow-up in 1 month. Would try a short course of PPI if not improving for silent reflux. -follow up advised 1 month -of course, we advised Christine Maldonado  to return or notify a doctor immediately if symptoms worsen or persist or new concerns arise.  Obesity (BMI 30.0-34.9) -Lifestyle recommendations  Pain in right buttock Somatic dysfunction of spine, lumbar Somatic dysfunction of pelvic region -discussed potential etiologies, her tenderness is mainly in the muscles, she does have somatic dysfunction -Opted for treatment with conservative over-the-counter options for pain, home exercises, and close follow-up, along with OMT today PROCEDURE NOTE : OSTEOPATHIC TREATMENT The decision today to treat with gentle Osteopathic Manipulative Therapy  (OMT) was based on physical exam findings. Verbal consent was  obtained after after explanation of risks and benefits. No Cervical HVLA manipulation was performed. After consent was obtained, treatment was  performed as below:      Regions treated: lumbar, gluteal     Techniques used: SCS, MR The patient tolerated the treatment well. Follow up treatment was advised in: 4 weeks   Recurrent major depressive disorder, in full  remission ([place]) -stable  Hyperglycemia - Plan: Hemoglobin A1c  Essential hypertension, benign - Plan: Basic metabolic panel, CBC  Need for immunization against influenza - Plan: Flu vaccine HIGH DOSE PF (Fluzone High dose)    -Patient advised to return or notify a doctor immediately if symptoms worsen or persist or new concerns arise.  Patient Instructions  BEFORE YOU LEAVE: -flu shot -phq9 -low back exercises -labs -follow up: 1 month for cough and buttock pain  For the cough: -start the flonase, 2 sprays each nostril daily for 1 month -follow up in 1 month  For the buttock pain: -do the exercises provided at least 3 days per week -use heat and topical menthol (tiger balm) for pain as needed -ok to use aleve a few days per week if needed per instructions  We have ordered labs or studies at this visit. It can take up to 1-2 weeks for results and processing. IF results require follow up or explanation, we will call you with instructions. Clinically stable results will be released to your [facility]. If you have not heard from [place] or cannot find your results in [facility] in 2 weeks please contact our office at [phone].  If you are not yet signed up for [facility], please consider signing up.   We recommend the following healthy lifestyle for LIFE: 1) Small portions.   Tip: eat off of a salad plate instead of a dinner plate.  Tip: It is ok to feel hungry after a meal - that likely means you ate an appropriate portion.  Tip: if you need more or a snack choose fruits, veggies and/or a handful  of nuts or seeds.  2) Eat a healthy clean diet.   TRY TO EAT: -at least 5-7 servings of low sugar vegetables per day (not corn, potatoes or bananas.) -berries are the best choice if you wish to eat fruit.   -lean meets (fish, chicken or [place] breasts) -vegan proteins for some meals - beans or tofu, whole grains, nuts and seeds -Replace bad fats with good fats - good fats include: fish,  nuts and seeds, canola oil, olive oil -small amounts of low fat or non fat dairy -small amounts of100 % whole grains - check the lables  AVOID: -SUGAR, sweets, anything with added sugar, corn syrup or sweeteners -if you must have a sweetener, small amounts of stevia may be best -sweetened beverages -simple starches (rice, bread, potatoes, pasta, chips, etc - small amounts of 100% whole grains are ok) -red meat, pork, butter -fried foods, fast food, processed food, excessive dairy, eggs and coconut.  3)Get at least 150 minutes of sweaty aerobic exercise per week.  4)Reduce stress - consider counseling, meditation and relaxation to balance other aspects of your life.   WE NOW OFFER   [facility] FAST TRACK!!!  SAME DAY Appointments for ACUTE CARE  Such as: Sprains, Injuries, cuts, abrasions, rashes, muscle pain, joint pain, back pain Colds, flu, sore throats, headache, allergies, cough, fever  Ear pain, sinus and eye infections Abdominal pain, nausea, vomiting, diarrhea, upset stomach Animal/insect bites  3 Easy Ways to Schedule: Walk-In Scheduling Call in scheduling Mychart Sign-up: [email or web address]                 Colin Benton R., DO

## 2016-10-05 ENCOUNTER — Encounter: Payer: Self-pay | Admitting: Family Medicine

## 2016-10-05 ENCOUNTER — Ambulatory Visit (INDEPENDENT_AMBULATORY_CARE_PROVIDER_SITE_OTHER): Payer: Medicare HMO | Admitting: Family Medicine

## 2016-10-05 ENCOUNTER — Ambulatory Visit (INDEPENDENT_AMBULATORY_CARE_PROVIDER_SITE_OTHER)
Admission: RE | Admit: 2016-10-05 | Discharge: 2016-10-05 | Disposition: A | Discharge status: Home / Self Care | Payer: Medicare HMO | Source: Ambulatory Visit | Attending: Family Medicine | Admitting: Family Medicine

## 2016-10-05 ENCOUNTER — Telehealth: Payer: Self-pay | Admitting: *Deleted

## 2016-10-05 VITALS — BP 110/70 | HR 91 | Temp 98.5°F | Ht 66.0 in | Wt 197.1 lb

## 2016-10-05 DIAGNOSIS — M7918 Myalgia, other site: Secondary | ICD-10-CM

## 2016-10-05 DIAGNOSIS — R05 Cough: Secondary | ICD-10-CM

## 2016-10-05 DIAGNOSIS — R053 Chronic cough: Secondary | ICD-10-CM

## 2016-10-05 DIAGNOSIS — F3342 Major depressive disorder, recurrent, in full remission: Secondary | ICD-10-CM

## 2016-10-05 DIAGNOSIS — M9905 Segmental and somatic dysfunction of pelvic region: Secondary | ICD-10-CM

## 2016-10-05 DIAGNOSIS — M791 Myalgia: Secondary | ICD-10-CM

## 2016-10-05 DIAGNOSIS — Z23 Encounter for immunization: Secondary | ICD-10-CM | POA: Diagnosis not present

## 2016-10-05 DIAGNOSIS — Z1331 Encounter for screening for depression: Secondary | ICD-10-CM

## 2016-10-05 DIAGNOSIS — R69 Illness, unspecified: Secondary | ICD-10-CM | POA: Diagnosis not present

## 2016-10-05 DIAGNOSIS — R911 Solitary pulmonary nodule: Secondary | ICD-10-CM | POA: Insufficient documentation

## 2016-10-05 DIAGNOSIS — E669 Obesity, unspecified: Secondary | ICD-10-CM

## 2016-10-05 DIAGNOSIS — M9903 Segmental and somatic dysfunction of lumbar region: Secondary | ICD-10-CM | POA: Diagnosis not present

## 2016-10-05 DIAGNOSIS — I10 Essential (primary) hypertension: Secondary | ICD-10-CM | POA: Diagnosis not present

## 2016-10-05 DIAGNOSIS — R739 Hyperglycemia, unspecified: Secondary | ICD-10-CM | POA: Diagnosis not present

## 2016-10-05 DIAGNOSIS — Z1389 Encounter for screening for other disorder: Secondary | ICD-10-CM | POA: Diagnosis not present

## 2016-10-05 DIAGNOSIS — I700 Atherosclerosis of aorta: Secondary | ICD-10-CM | POA: Insufficient documentation

## 2016-10-05 LAB — BASIC METABOLIC PANEL
BUN: 20 mg/dL (ref 6–23)
CHLORIDE: 95 meq/L — AB (ref 96–112)
CO2: 34 mEq/L — ABNORMAL HIGH (ref 19–32)
Calcium: 9.6 mg/dL (ref 8.4–10.5)
Creatinine, Ser: 0.86 mg/dL (ref 0.40–1.20)
GFR: 66.47 mL/min (ref >60.00)
Glucose, Bld: 93 mg/dL (ref 70–99)
POTASSIUM: 4.1 meq/L (ref 3.5–5.1)
SODIUM: 136 meq/L (ref 135–145)

## 2016-10-05 LAB — CBC
HEMATOCRIT: 41.3 % (ref 36.0–46.0)
Hemoglobin: 13.6 g/dL (ref 12.0–15.0)
MCHC: 33 g/dL (ref 30.0–36.0)
MCV: 92.1 fl (ref 78.0–100.0)
PLATELETS: 283 10*3/uL (ref 150.0–400.0)
RBC: 4.48 Mil/uL (ref 3.87–5.11)
RDW: 14.1 % (ref 11.5–15.5)
WBC: 6 10*3/uL (ref 4.0–10.5)

## 2016-10-05 LAB — HEMOGLOBIN A1C: Hgb A1c MFr Bld: 6.2 % (ref 4.6–6.5)

## 2016-10-05 NOTE — Telephone Encounter (Signed)
Opal Sidles called from [facility] with a call report for the chest x-ray which reveals a 1.2 X 0.8 cm nodular opacity overlying the anterior heart border. Recommendations are a non-contrast chest CT.  Message given verbally and sent to Dr Julianne Rice basket.

## 2016-10-05 NOTE — Patient Instructions (Addendum)
BEFORE YOU LEAVE: -flu shot -phq9 -low back exercises -labs -follow up: 1 month for cough and buttock pain  For the cough: -start the flonase, 2 sprays each nostril daily for 1 month -follow up in 1 month  For the buttock pain: -do the exercises provided at least 3 days per week -use heat and topical menthol (tiger balm) for pain as needed -ok to use aleve a few days per week if needed per instructions  We have ordered labs or studies at this visit. It can take up to 1-2 weeks for results and processing. IF results require follow up or explanation, we will call you with instructions. Clinically stable results will be released to your [facility]. If you have not heard from [place] or cannot find your results in [facility] in 2 weeks please contact our office at [phone].  If you are not yet signed up for [facility], please consider signing up.   We recommend the following healthy lifestyle for LIFE: 1) Small portions.   Tip: eat off of a salad plate instead of a dinner plate.  Tip: It is ok to feel hungry after a meal - that likely means you ate an appropriate portion.  Tip: if you need more or a snack choose fruits, veggies and/or a handful of nuts or seeds.  2) Eat a healthy clean diet.   TRY TO EAT: -at least 5-7 servings of low sugar vegetables per day (not corn, potatoes or bananas.) -berries are the best choice if you wish to eat fruit.   -lean meets (fish, chicken or [place] breasts) -vegan proteins for some meals - beans or tofu, whole grains, nuts and seeds -Replace bad fats with good fats - good fats include: fish, nuts and seeds, canola oil, olive oil -small amounts of low fat or non fat dairy -small amounts of100 % whole grains - check the lables  AVOID: -SUGAR, sweets, anything with added sugar, corn syrup or sweeteners -if you must have a sweetener, small amounts of stevia may be best -sweetened beverages -simple starches (rice, bread, potatoes, pasta, chips, etc - small  amounts of 100% whole grains are ok) -red meat, pork, butter -fried foods, fast food, processed food, excessive dairy, eggs and coconut.  3)Get at least 150 minutes of sweaty aerobic exercise per week.  4)Reduce stress - consider counseling, meditation and relaxation to balance other aspects of your life.   WE NOW OFFER   [facility] FAST TRACK!!!  SAME DAY Appointments for ACUTE CARE  Such as: Sprains, Injuries, cuts, abrasions, rashes, muscle pain, joint pain, back pain Colds, flu, sore throats, headache, allergies, cough, fever  Ear pain, sinus and eye infections Abdominal pain, nausea, vomiting, diarrhea, upset stomach Animal/insect bites  3 Easy Ways to Schedule: Walk-In Scheduling Call in scheduling Mychart Sign-up: [email or web address]

## 2016-10-06 ENCOUNTER — Inpatient Hospital Stay: Admission: RE | Admit: 2016-10-06 | Payer: Medicare HMO | Source: Ambulatory Visit

## 2016-10-06 ENCOUNTER — Other Ambulatory Visit: Payer: Self-pay | Admitting: *Deleted

## 2016-10-06 DIAGNOSIS — R911 Solitary pulmonary nodule: Secondary | ICD-10-CM

## 2016-10-09 ENCOUNTER — Telehealth: Payer: Self-pay | Admitting: Emergency Medicine

## 2016-10-09 ENCOUNTER — Ambulatory Visit (INDEPENDENT_AMBULATORY_CARE_PROVIDER_SITE_OTHER)
Admission: RE | Admit: 2016-10-09 | Discharge: 2016-10-09 | Disposition: A | Discharge status: Home / Self Care | Payer: Medicare HMO | Source: Ambulatory Visit | Attending: Family Medicine | Admitting: Family Medicine

## 2016-10-09 ENCOUNTER — Telehealth: Payer: Self-pay | Admitting: *Deleted

## 2016-10-09 DIAGNOSIS — R911 Solitary pulmonary nodule: Secondary | ICD-10-CM

## 2016-10-09 NOTE — Telephone Encounter (Signed)
Pt following up on CT results

## 2016-10-09 NOTE — Telephone Encounter (Signed)
Per Armando Gang with [facility] called and stated the pts CT results are in her chart.  Message sent to Dr Maudie Mercury.

## 2016-10-09 NOTE — Telephone Encounter (Signed)
[name] from [facility] called in inform Dr. Maudie Mercury that results are posted. Dr. Maudie Mercury MA is aware

## 2016-10-09 NOTE — Telephone Encounter (Signed)
Pt calling to check the status of the CT results.

## 2016-10-10 ENCOUNTER — Other Ambulatory Visit: Payer: Self-pay | Admitting: *Deleted

## 2016-10-10 ENCOUNTER — Encounter: Payer: Self-pay | Admitting: Family Medicine

## 2016-10-10 DIAGNOSIS — I2721 Secondary pulmonary arterial hypertension: Secondary | ICD-10-CM

## 2016-10-10 DIAGNOSIS — N200 Calculus of kidney: Secondary | ICD-10-CM | POA: Insufficient documentation

## 2016-10-10 NOTE — Telephone Encounter (Signed)
Patient notified-see results note.

## 2016-10-10 NOTE — Telephone Encounter (Signed)
See result note.  

## 2016-10-17 ENCOUNTER — Institutional Professional Consult (permissible substitution): Payer: Medicare HMO | Admitting: Pulmonary Disease

## 2016-10-19 ENCOUNTER — Encounter: Payer: Self-pay | Admitting: Family Medicine

## 2016-10-23 ENCOUNTER — Ambulatory Visit
Admission: RE | Admit: 2016-10-23 | Discharge: 2016-10-23 | Disposition: A | Discharge status: Home / Self Care | Payer: Medicare HMO | Source: Ambulatory Visit | Attending: Family Medicine | Admitting: Family Medicine

## 2016-10-23 DIAGNOSIS — Z1231 Encounter for screening mammogram for malignant neoplasm of breast: Secondary | ICD-10-CM

## 2016-10-31 ENCOUNTER — Other Ambulatory Visit: Payer: Self-pay | Admitting: Family Medicine

## 2016-11-01 NOTE — Progress Notes (Signed)
HPI:  Chronic cough: - started around 01/2016 per her report, but she told me about this 09/2016 -Symptoms include intermittent cough, drainage in the throat, globus sensation at time -Denies: fevers, hemoptysis, shortness of breath, acid reflux symptoms -She does have a history of allergies and is taking an antihistamine daily -did cxr and ct, referred to pulm -she reports did flonase for a few weeks and symptoms much better, thinks does have some silent reflux but di dnot try ppi  left buttock pain: -started in the summer of 2018, told me about it 09/2016 -Reports remote history of piriformis syndrome -Pain is in the upper buttock region, worse with certain activities -Occasional radiation to the upper leg initially -treated conservatively with OMT, HEP and conservative symptom management last visit -reports much improved after OMT and wants to do OMT today as well  HTN: -meds: hctz  MDD: -meds: effexor  Diet controlled Prediabetes: -no meds  Seasonal allergies: -meds: allegra  Seb derm scalp: -sees dermatologist  Sees Dr. Sabra Heck in gyn. Sees Dr. Latanya Maudlin for hammer toe.  ROS: See pertinent positives and negatives per HPI.  Past Medical History:  Diagnosis Date  . Anemia   . Bleeding 1993   BTB on HRT-endo biopsy proe with focal breakdown  . Bleeding 1995   BTB on HRT-benign endo biopsy  . Breast cyst 12/95   right breast  . Colon polyps   . Depression   . History of breast cancer 2008   DCIS/invasive ductal cancer right breast ER+/PR+  . Hot flashes 02/09/2014  . Hyperglycemia 02/09/2014   Prediabetes labs 03/2013 at [facility]   . Hypertension   . Migraine   . Osteopenia   . Piriformis muscle pain   . Plantar fasciitis   . PUD (peptic ulcer disease) 8/94   Dr Magod-endoscopy  . Seasonal allergies   . Squamous cell carcinoma 6/11 7/11   on leg-removed  . Ulcer   . UTI (urinary tract infection)     Past Surgical History:  Procedure Laterality Date  .  BREAST EXCISIONAL BIOPSY Right 2008   benign  . BREAST LUMPECTOMY Right 06/06/2006   right inner quadrant - invasive with sentinel node biopsy, ER/ PR +;  Her2 Nu negtive  . DILATION AND CURETTAGE OF UTERUS  50+ years ago   for retained placenta  . DILATION AND CURETTAGE OF UTERUS  1986  . KNEE ARTHROSCOPY  2016   Dr. Novella Olive    Family History  Problem Relation Age of Onset  . Ovarian cancer Mother   . Hypertension Mother   . Arthritis Father   . Cancer Father   . Heart attack Brother   . Diabetes Other        granddaughter  . Kidney failure Sister     Social History   Social History  . Marital status: Widowed    Spouse name: N/A  . Number of children: N/A  . Years of education: N/A   Social History Main Topics  . Smoking status: Former Smoker    Packs/day: 1.00    Years: 15.00    Quit date: 06/27/1965  . Smokeless tobacco: Never Used     Comment: smoked in the [employer]; 15 year approx  . Alcohol use No  . Drug use: No  . Sexual activity: No   Other Topics Concern  . None   Social History Narrative   Tobacco use, amount per day now: 0      Past tobacco use, amount per  day: Carton      How many years did you use tobacco: 1952- 1967      Alcohol use (drinks per week): 0      Diet:      Do you drink/eat things with caffeine? Yes      Marital status:             What year were you married?      Do you live in a house, apartment, assisted living, condo, trailer? Apartment       Is it one or more stories? Yes       How many persons live in your home?      Do you have any pets in your home? 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  Yes(walking)             How often?      Do you have a living will? Yes      Do you have a DNR form?  Yes          If not, do you want to discuss one?      Do you have signed POA/HPOA forms?   Yes                            Current Outpatient Prescriptions:  .  Ascorbic Acid (VITAMIN C  PO), Take 1,000 mg by mouth daily. , Disp: , Rfl:  .  CALCIUM PO, Take 600 mg by mouth daily. , Disp: , Rfl:  .  Cholecalciferol 1000 units capsule, Take 2,000 Units by mouth daily., Disp: , Rfl:  .  Coenzyme Q10 (CO Q 10 PO), Take by mouth daily., Disp: , Rfl:  .  CRANBERRY PO, Take 84 mg by mouth daily. , Disp: , Rfl:  .  docusate sodium (COLACE) 100 MG capsule, Take 200 mg by mouth at bedtime. , Disp: , Rfl:  .  fexofenadine (ALLEGRA) 180 MG tablet, Take 180 mg by mouth daily., Disp: , Rfl:  .  Garlic 1000 MG CAPS, Take 1,000 mg by mouth daily., Disp: , Rfl:  .  hydrochlorothiazide (HYDRODIURIL) 25 MG tablet, TAKE 1 TABLET BY MOUTH ONCE DAILY, Disp: 90 tablet, Rfl: 1 .  Multiple Vitamin (MULTIVITAMIN) capsule, Take 1 capsule by mouth daily.  , Disp: , Rfl:  .  NON FORMULARY, Tumeric, Disp: , Rfl:  .  Omega-3 Fatty Acids (FISH OIL) 1200 MG CAPS, Take 1 capsule by mouth daily., Disp: , Rfl:  .  Probiotic Product (PROBIOTIC DAILY PO), Take by mouth daily., Disp: , Rfl:  .  venlafaxine (EFFEXOR) 75 MG tablet, Take 1 tablet (75 mg total) by mouth 2 (two) times daily with a meal., Disp: 180 tablet, Rfl: 1  EXAM:  Vitals:   11/02/16 1003  BP: 120/70  Pulse: 98  Temp: 98.1 F (36.7 C)    Body mass index is 31.96 kg/m.  GENERAL: vitals reviewed and listed above, alert, oriented, appears well hydrated and in no acute distress  HEENT: atraumatic, conjunttiva clear, no obvious abnormalities on inspection of external nose and ears, normal appearance of ear canals and TMs, clear nasal congestion, mild post oropharyngeal erythema with PND, no tonsillar edema or exudate, no sinus TTP  NECK: no obvious masses on inspection  LUNGS: clear to auscultation bilaterally, no wheezes, rales or rhonchi, good air movement  CV: HRRR, no peripheral  edema  MS: moves all extremities without noticeable abnormality, gait normal, osteopathic findings include suboccipital muscle tension with OA left, lumbar  paraspinal muscle tension particularly on the right with an upper pole L5 tender point on the left left on right sacral torsion  PSYCH: pleasant and cooperative, no obvious depression or anxiety  ASSESSMENT AND PLAN:  Discussed the following assessment and plan:  Cough  Globus sensation  Chronic left-sided low back pain without sciatica  Somatic dysfunction of cervical region  Somatic dysfunction of lumbar region  Sacral region somatic dysfunction  -For chronic cough she has opted not to see the pulmonologist, she feels her symptoms have improved significantly with a short course of intranasal steroid -Discussed potential etiologies, advised continuing a course of nasal steroid to see if symptoms completely resolved. If not adding a short course of PPI to see if this results in resolution. Advised to call us in 1 month to let us know. Advised she should see pulmonology if any persistent symptoms. -She opted for osteopathic treatment of her back issues again, she had a very good response after her last treatment. PROCEDURE NOTE : OSTEOPATHIC TREATMENT The decision today to treat with gentle Osteopathic Manipulative Therapy  (OMT) was based on physical exam findings. Verbal consent was  obtained after after explanation of risks and benefits. No Cervical HVLA manipulation was performed. After consent was obtained, treatment was  performed as below:      Regions treated: Cervical, lumbar, sacral     Techniques used: counterstrain, myofascial release The patient tolerated the treatment well and reported Improved  symptoms following treatment today. Follow up treatment was advised in: 1-2 weeks if any persistent symptoms  -Patient advised to return or notify a doctor immediately if symptoms worsen or persist or new concerns arise.  Patient Instructions  BEFORE YOU LEAVE: -follow up: 3-4 months -sooner if you feel another osteopathic treatment would be helpful for the  back  Please do the following for the cough and throat issue: -flonase 2 sprays each nostril daily for 1 month, then 1 spray each nostril daily -if not resolved in 2 weeks add over the counter nexium or prilosec once daily for 4 weeks -call if symptoms do not resolve with these measures    Colin Benton R., DO

## 2016-11-02 ENCOUNTER — Encounter: Payer: Self-pay | Admitting: Family Medicine

## 2016-11-02 ENCOUNTER — Ambulatory Visit (INDEPENDENT_AMBULATORY_CARE_PROVIDER_SITE_OTHER): Payer: Medicare HMO | Admitting: Family Medicine

## 2016-11-02 VITALS — BP 120/70 | HR 98 | Temp 98.1°F | Ht 66.0 in | Wt 198.0 lb

## 2016-11-02 DIAGNOSIS — R05 Cough: Secondary | ICD-10-CM

## 2016-11-02 DIAGNOSIS — R198 Other specified symptoms and signs involving the digestive system and abdomen: Secondary | ICD-10-CM

## 2016-11-02 DIAGNOSIS — F458 Other somatoform disorders: Secondary | ICD-10-CM

## 2016-11-02 DIAGNOSIS — M9904 Segmental and somatic dysfunction of sacral region: Secondary | ICD-10-CM

## 2016-11-02 DIAGNOSIS — R69 Illness, unspecified: Secondary | ICD-10-CM | POA: Diagnosis not present

## 2016-11-02 DIAGNOSIS — M545 Low back pain: Secondary | ICD-10-CM | POA: Diagnosis not present

## 2016-11-02 DIAGNOSIS — M9903 Segmental and somatic dysfunction of lumbar region: Secondary | ICD-10-CM | POA: Diagnosis not present

## 2016-11-02 DIAGNOSIS — G8929 Other chronic pain: Secondary | ICD-10-CM | POA: Diagnosis not present

## 2016-11-02 DIAGNOSIS — R0989 Other specified symptoms and signs involving the circulatory and respiratory systems: Secondary | ICD-10-CM

## 2016-11-02 DIAGNOSIS — M9901 Segmental and somatic dysfunction of cervical region: Secondary | ICD-10-CM | POA: Diagnosis not present

## 2016-11-02 DIAGNOSIS — R059 Cough, unspecified: Secondary | ICD-10-CM

## 2016-11-02 NOTE — Patient Instructions (Signed)
BEFORE YOU LEAVE: -follow up: 3-4 months -sooner if you feel another osteopathic treatment would be helpful for the back  Please do the following for the cough and throat issue: -flonase 2 sprays each nostril daily for 1 month, then 1 spray each nostril daily -if not resolved in 2 weeks add over the counter nexium or prilosec once daily for 4 weeks -call if symptoms do not resolve with these measures

## 2016-11-06 DIAGNOSIS — R69 Illness, unspecified: Secondary | ICD-10-CM | POA: Diagnosis not present

## 2016-12-06 ENCOUNTER — Encounter: Payer: Self-pay | Admitting: Internal Medicine

## 2017-01-02 ENCOUNTER — Encounter: Payer: Self-pay | Admitting: Family Medicine

## 2017-01-02 ENCOUNTER — Ambulatory Visit (INDEPENDENT_AMBULATORY_CARE_PROVIDER_SITE_OTHER): Payer: Medicare HMO | Admitting: Family Medicine

## 2017-01-02 VITALS — BP 128/80 | HR 107 | Temp 97.1°F | Ht 66.0 in | Wt 201.0 lb

## 2017-01-02 DIAGNOSIS — R197 Diarrhea, unspecified: Secondary | ICD-10-CM | POA: Diagnosis not present

## 2017-01-02 DIAGNOSIS — F339 Major depressive disorder, recurrent, unspecified: Secondary | ICD-10-CM

## 2017-01-02 DIAGNOSIS — M545 Low back pain, unspecified: Secondary | ICD-10-CM

## 2017-01-02 DIAGNOSIS — G8929 Other chronic pain: Secondary | ICD-10-CM

## 2017-01-02 DIAGNOSIS — R69 Illness, unspecified: Secondary | ICD-10-CM | POA: Diagnosis not present

## 2017-01-02 NOTE — Progress Notes (Signed)
HPI:  Acute visit for diarrhea: -Had some abdominal cramping and one bout of watery diarrhea about 1 week ago -She had one episode of diarrhea a few days later -Since then she has had no symptoms -Denies hematochezia, melena, abdominal pain since, nausea or vomiting  Chronic cough: -Cancel that appointment because her son who is a pediatric neurologist said there is no reason to see a pulmonologist -She had areas of clustered micro nodularity in the right upper right lower lungs -We tried various things and finally referred her to pulmonology  He has some right low back pain from time to time chronic.  This is better when she stretches and moves.  No radiation, weakness, numbness or other symptoms.  She has chronic depression.  She feels tired sometimes.  She wonders if she should change her Effexor.  She is not getting counseling right now.  Fatigue has not changed.  ROS: See pertinent positives and negatives per HPI.  Past Medical History:  Diagnosis Date  . Anemia   . Bleeding 1993   BTB on HRT-endo biopsy proe with focal breakdown  . Bleeding 1995   BTB on HRT-benign endo biopsy  . Breast cyst 12/95   right breast  . Colon polyps   . Depression   . History of breast cancer 2008   DCIS/invasive ductal cancer right breast ER+/PR+  . Hot flashes 02/09/2014  . Hyperglycemia 02/09/2014   Prediabetes labs 03/2013 at [facility]   . Hypertension   . Migraine   . Osteopenia   . Piriformis muscle pain   . Plantar fasciitis   . PUD (peptic ulcer disease) 8/94   Dr Magod-endoscopy  . Seasonal allergies   . Squamous cell carcinoma 6/11 7/11   on leg-removed  . Ulcer   . UTI (urinary tract infection)     Past Surgical History:  Procedure Laterality Date  . BREAST EXCISIONAL BIOPSY Right 2008   benign  . BREAST LUMPECTOMY Right 06/06/2006   right inner quadrant - invasive with sentinel node biopsy, ER/ PR +;  Her2 Nu negtive  . DILATION AND CURETTAGE OF UTERUS  50+ years ago   for  retained placenta  . DILATION AND CURETTAGE OF UTERUS  1986  . KNEE ARTHROSCOPY  2016   Dr. Novella Olive    Family History  Problem Relation Age of Onset  . Ovarian cancer Mother   . Hypertension Mother   . Arthritis Father   . Cancer Father   . Heart attack Brother   . Diabetes Other        granddaughter  . Kidney failure Sister     Social History   Socioeconomic History  . Marital status: Widowed    Spouse name: None  . Number of children: None  . Years of education: None  . Highest education level: None  Social Needs  . Financial resource strain: None  . Food insecurity - worry: None  . Food insecurity - inability: None  . Transportation needs - medical: None  . Transportation needs - non-medical: None  Occupational History  . None  Tobacco Use  . Smoking status: Former Smoker    Packs/day: 1.00    Years: 15.00    Pack years: 15.00    Last attempt to quit: 06/27/1965    Years since quitting: 51.5  . Smokeless tobacco: Never Used  . Tobacco comment: smoked in the [employer]; 15 year approx  Substance and Sexual Activity  . Alcohol use: No  . Drug use:  No  . Sexual activity: No    Birth control/protection: Post-menopausal  Other Topics Concern  . None  Social History Narrative   Tobacco use, amount per day now: 0      Past tobacco use, amount per day: Carton      How many years did you use tobacco: [place]      Alcohol use (drinks per week): 0      Diet:      Do you drink/eat things with caffeine? Yes      Marital status:             What year were you married?      Do you live in a house, apartment, assisted living, condo, trailer? Apartment       Is it one or more stories? Yes       How many persons live in your home?      Do you have any pets in your home? 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  Yes(walking)             How often?      Do you have a living will? Yes      Do you have a DNR form?  Yes           If not, do you want to discuss one?      Do you have signed POA/HPOA forms?   Yes                         Current Outpatient Medications:  .  Ascorbic Acid (VITAMIN C PO), Take 1,000 mg by mouth daily. , Disp: , Rfl:  .  CALCIUM PO, Take 600 mg by mouth daily. , Disp: , Rfl:  .  Cholecalciferol 1000 units capsule, Take 2,000 Units by mouth daily., Disp: , Rfl:  .  Coenzyme Q10 (CO Q 10 PO), Take by mouth daily., Disp: , Rfl:  .  CRANBERRY PO, Take 84 mg by mouth daily. , Disp: , Rfl:  .  docusate sodium (COLACE) 100 MG capsule, Take 200 mg by mouth at bedtime. , Disp: , Rfl:  .  fexofenadine (ALLEGRA) 180 MG tablet, Take 180 mg by mouth daily., Disp: , Rfl:  .  Garlic 1610 MG CAPS, Take 1,000 mg by mouth daily., Disp: , Rfl:  .  hydrochlorothiazide (HYDRODIURIL) 25 MG tablet, TAKE 1 TABLET BY MOUTH ONCE DAILY, Disp: 90 tablet, Rfl: 1 .  Multiple Vitamin (MULTIVITAMIN) capsule, Take 1 capsule by mouth daily.  , Disp: , Rfl:  .  NON FORMULARY, Tumeric, Disp: , Rfl:  .  Omega-3 Fatty Acids (FISH OIL) 1200 MG CAPS, Take 1 capsule by mouth daily., Disp: , Rfl:  .  Probiotic Product (PROBIOTIC DAILY PO), Take by mouth daily., Disp: , Rfl:  .  venlafaxine (EFFEXOR) 75 MG tablet, Take 1 tablet (75 mg total) by mouth 2 (two) times daily with a meal., Disp: 180 tablet, Rfl: 1  EXAM:  Vitals:   01/02/17 1444  BP: 128/80  Pulse: (!) 107  Temp: (!) 97.1 F (36.2 C)    Body mass index is 32.44 kg/m.  GENERAL: vitals reviewed and listed above, alert, oriented, appears well hydrated and in no acute distress  HEENT: atraumatic, conjunttiva clear, no obvious abnormalities on inspection of external nose and ears  NECK: no obvious masses on inspection  LUNGS:  clear to auscultation bilaterally, no wheezes, rales or rhonchi, good air movement  CV: HRRR, no peripheral edema  MS: moves all extremities without noticeable abnormality  PSYCH: pleasant and cooperative, no obvious depression or  anxiety  ASSESSMENT AND PLAN:  Discussed the following assessment and plan:  Diarrhea, unspecified type  Depression, recurrent (HCC)  Chronic right-sided low back pain without sciatica  -I think her issues with the diarrhea are likely a stomach bug and it seems to have resolved, advised no dairy for a week and return if symptoms are persistent -Advised again that she follow-up with pulmonologist about the cough and the CT findings, she agreed to call -Advised cognitive behavioral therapy for the depression, she agrees to consider -Follow-up in 1 month -Patient advised to return or notify a doctor immediately if symptoms worsen or persist or new concerns arise.  Patient Instructions  BEFORE YOU LEAVE: -low back exercises -number for pulmonology office to call to schedule appointment about chronic cough -follow up: 1 month   Please avoid dairy and red meat for about 2 weeks.  Take imodium if any further diarrhea.  Follow up sooner if worsening or new concerns.  Consider scheduling Cognitive Behavioral therapy - call number in the brochure provided  Do the back exercises at least 4 days per week.    Colin Benton R., DO

## 2017-01-02 NOTE — Patient Instructions (Addendum)
BEFORE YOU LEAVE: -low back exercises -number for pulmonology office to call to schedule appointment about chronic cough -follow up: 1 month   Please avoid dairy and red meat for about 2 weeks.  Take imodium if any further diarrhea.  Follow up sooner if worsening or new concerns.  Consider scheduling Cognitive Behavioral therapy - call number in the brochure provided  Do the back exercises at least 4 days per week.

## 2017-01-03 ENCOUNTER — Encounter: Payer: Self-pay | Admitting: Internal Medicine

## 2017-01-04 ENCOUNTER — Telehealth: Payer: Self-pay

## 2017-01-04 NOTE — Telephone Encounter (Signed)
Called the patient to schedule a follow up appointment with Dr. Bubba Camp and her lab appointment. Patient stated that she wanted to stay with her current pcp Colin Benton DO.

## 2017-01-31 ENCOUNTER — Ambulatory Visit: Payer: Self-pay

## 2017-01-31 NOTE — Telephone Encounter (Signed)
  Reason for Disposition . SEVERE coughing spells (e.g., whooping sound after coughing, vomiting after coughing)  Answer Assessment - Initial Assessment Questions 1. ONSET: "When did the cough begin?"      Last January 2. SEVERITY: "How bad is the cough today?"      Pretty bad 3. RESPIRATORY DISTRESS: "Describe your breathing."      No distress 4. FEVER: "Do you have a fever?" If so, ask: "What is your temperature, how was it measured, and when did it start?"     No 5. SPUTUM: "Describe the color of your sputum" (e.g., clear, white, yellow, green), "Has there been any change recently?"     Very little - clear and sometimes yellow 6. HEMOPTYSIS: "Are you coughing up any blood?" If so ask: "How much, flecks, streaks, tablespoons, etc.?"     No 7. CARDIAC HISTORY: "Do you have any history of heart disease?" (e.g., heart attack, congestive heart failure)      No 8. LUNG HISTORY: "Do you have any history of lung disease?"  (e.g., pulmonary embolus, asthma, emphysema/COPD)     No 9. OTHER SYMPTOMS: "Do you have any other symptoms? (e.g., runny nose, wheezing, chest pain)     Wheezing - comes and goes 10. PREGNANCY: "Is there any chance you are pregnant?" "When was your last menstrual period?"       No 11. TRAVEL: "Have you traveled out of the country in the last month?" (e.g., travel history, exposures)       No  Protocols used: COUGH - CHRONIC-A-AH Pt. Given appointment for tomorrow.

## 2017-01-31 NOTE — Progress Notes (Deleted)
HPI:  Here about chronic cough: -reported 09/2016 -we advised trial INS and then PPI - she did try the INS - ? Helpful, did not try the PPI, we advised CXR -possible nodule CXR, CT abnormal and referred to pulmonologist given chronic cough and abnormal CT lungs - she did not follow our recommendations -today reports   ROS: See pertinent positives and negatives per HPI.  Past Medical History:  Diagnosis Date  . Anemia   . Bleeding 1993   BTB on HRT-endo biopsy proe with focal breakdown  . Bleeding 1995   BTB on HRT-benign endo biopsy  . Breast cyst 12/95   right breast  . Colon polyps   . Depression   . History of breast cancer 2008   DCIS/invasive ductal cancer right breast ER+/PR+  . Hot flashes 02/09/2014  . Hyperglycemia 02/09/2014   [facility] 03/2013 at [facility]   . Hypertension   . Migraine   . Osteopenia   . Piriformis muscle pain   . Plantar fasciitis   . PUD (peptic ulcer disease) 8/94   Dr Magod-endoscopy  . Seasonal allergies   . Squamous cell carcinoma 6/11 7/11   on leg-removed  . Ulcer   . UTI (urinary tract infection)     Past Surgical History:  Procedure Laterality Date  . BREAST EXCISIONAL BIOPSY Right 2008   benign  . BREAST LUMPECTOMY Right 06/06/2006   right inner quadrant - invasive with sentinel node biopsy, ER/ PR +;  Her2 Nu negtive  . DILATION AND CURETTAGE OF UTERUS  50+ years ago   for retained placenta  . DILATION AND CURETTAGE OF UTERUS  1986  . KNEE ARTHROSCOPY  2016   Dr. Novella Olive    Family History  Problem Relation Age of Onset  . Ovarian cancer Mother   . Hypertension Mother   . Arthritis Father   . Cancer Father   . Heart attack Brother   . Diabetes Other        granddaughter  . Kidney failure Sister     Social History   Socioeconomic History  . Marital status: Widowed    Spouse name: Not on file  . Number of children: Not on file  . Years of education: Not on file  . Highest education level: Not on file  Social  Needs  . Financial resource strain: Not on file  . Food insecurity - worry: Not on file  . Food insecurity - inability: Not on file  . Transportation needs - medical: Not on file  . Transportation needs - non-medical: Not on file  Occupational History  . Not on file  Tobacco Use  . Smoking status: Former Smoker    Packs/day: 1.00    Years: 15.00    Pack years: 15.00    Last attempt to quit: 06/27/1965    Years since quitting: 51.6  . Smokeless tobacco: Never Used  . Tobacco comment: smoked in the [employer]; 15 year approx  Substance and Sexual Activity  . Alcohol use: No  . Drug use: No  . Sexual activity: No    Birth control/protection: Post-menopausal  Other Topics Concern  . Not on file  Social History Narrative   Tobacco use, amount per day now: 0      Past tobacco use, amount per day: Carton      How many years did you use tobacco: [place]      Alcohol use (drinks per week): 0      Diet:  Do you drink/eat things with caffeine? Yes      Marital status:             What year were you married?      Do you live in a house, apartment, assisted living, condo, trailer? Apartment       Is it one or more stories? Yes       How many persons live in your home?      Do you have any pets in your home? 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  Yes(walking)             How often?      Do you have a living will? Yes      Do you have a DNR form?  Yes          If not, do you want to discuss one?      Do you have signed POA/HPOA forms?   Yes                         Current Outpatient Medications:  .  Ascorbic Acid (VITAMIN C PO), Take 1,000 mg by mouth daily. , Disp: , Rfl:  .  CALCIUM PO, Take 600 mg by mouth daily. , Disp: , Rfl:  .  Cholecalciferol 1000 units capsule, Take 2,000 Units by mouth daily., Disp: , Rfl:  .  Coenzyme Q10 (CO Q 10 PO), Take by mouth daily., Disp: , Rfl:  .  CRANBERRY PO, Take 84 mg by mouth daily. ,  Disp: , Rfl:  .  docusate sodium (COLACE) 100 MG capsule, Take 200 mg by mouth at bedtime. , Disp: , Rfl:  .  fexofenadine (ALLEGRA) 180 MG tablet, Take 180 mg by mouth daily., Disp: , Rfl:  .  Garlic 1245 MG CAPS, Take 1,000 mg by mouth daily., Disp: , Rfl:  .  hydrochlorothiazide (HYDRODIURIL) 25 MG tablet, TAKE 1 TABLET BY MOUTH ONCE DAILY, Disp: 90 tablet, Rfl: 1 .  Multiple Vitamin (MULTIVITAMIN) capsule, Take 1 capsule by mouth daily.  , Disp: , Rfl:  .  NON FORMULARY, Tumeric, Disp: , Rfl:  .  Omega-3 Fatty Acids (FISH OIL) 1200 MG CAPS, Take 1 capsule by mouth daily., Disp: , Rfl:  .  Probiotic Product (PROBIOTIC DAILY PO), Take by mouth daily., Disp: , Rfl:  .  venlafaxine (EFFEXOR) 75 MG tablet, Take 1 tablet (75 mg total) by mouth 2 (two) times daily with a meal., Disp: 180 tablet, Rfl: 1  EXAM:  There were no vitals filed for this visit.  There is no height or weight on file to calculate BMI.  GENERAL: vitals reviewed and listed above, alert, oriented, appears well hydrated and in no acute distress  HEENT: atraumatic, conjunttiva clear, no obvious abnormalities on inspection of external nose and ears  NECK: no obvious masses on inspection  LUNGS: clear to auscultation bilaterally, no wheezes, rales or rhonchi, good air movement  CV: HRRR, no peripheral edema  MS: moves all extremities without noticeable abnormality *** PSYCH: pleasant and cooperative, no obvious depression or anxiety  ASSESSMENT AND PLAN:  Discussed the following assessment and plan:  No diagnosis found.  *** -Patient advised to return or notify a doctor immediately if symptoms worsen or persist or new concerns arise.  There are no Patient Instructions on file for this visit.  Colin Benton R., DO

## 2017-02-01 ENCOUNTER — Ambulatory Visit: Payer: Medicare HMO | Admitting: Family Medicine

## 2017-02-01 DIAGNOSIS — Z0289 Encounter for other administrative examinations: Secondary | ICD-10-CM

## 2017-02-01 NOTE — Progress Notes (Signed)
HPI:  No showed appt yesterday.  Acute visit for cough and congestion: -started feeling sick 3-4 days ago -symptoms: cough, wheezy feeling at times, tired, nasal congestion, mild body aches -denies: SOB, vomiting, hemoptysis, flu exposure -had flu shot -she plans to see pulmonologist regarding the chronic cough, but lost the number  ROS: See pertinent positives and negatives per HPI.  Past Medical History:  Diagnosis Date  . Anemia   . Bleeding 1993   BTB on HRT-endo biopsy proe with focal breakdown  . Bleeding 1995   BTB on HRT-benign endo biopsy  . Breast cyst 12/95   right breast  . Colon polyps   . Depression   . History of breast cancer 2008   DCIS/invasive ductal cancer right breast ER+/PR+  . Hot flashes 02/09/2014  . Hyperglycemia 02/09/2014   Prediabetes labs 03/2013 at [facility]   . Hypertension   . Migraine   . Osteopenia   . Piriformis muscle pain   . Plantar fasciitis   . PUD (peptic ulcer disease) 8/94   Dr Magod-endoscopy  . Seasonal allergies   . Squamous cell carcinoma 6/11 7/11   on leg-removed  . Ulcer   . UTI (urinary tract infection)     Past Surgical History:  Procedure Laterality Date  . BREAST EXCISIONAL BIOPSY Right 2008   benign  . BREAST LUMPECTOMY Right 06/06/2006   right inner quadrant - invasive with sentinel node biopsy, ER/ PR +;  Her2 Nu negtive  . DILATION AND CURETTAGE OF UTERUS  50+ years ago   for retained placenta  . DILATION AND CURETTAGE OF UTERUS  1986  . KNEE ARTHROSCOPY  2016   Dr. Novella Olive    Family History  Problem Relation Age of Onset  . Ovarian cancer Mother   . Hypertension Mother   . Arthritis Father   . Cancer Father   . Heart attack Brother   . Diabetes Other        granddaughter  . Kidney failure Sister     Social History   Socioeconomic History  . Marital status: Widowed    Spouse name: None  . Number of children: None  . Years of education: None  . Highest education level: None  Social Needs  .  Financial resource strain: None  . Food insecurity - worry: None  . Food insecurity - inability: None  . Transportation needs - medical: None  . Transportation needs - non-medical: None  Occupational History  . None  Tobacco Use  . Smoking status: Former Smoker    Packs/day: 1.00    Years: 15.00    Pack years: 15.00    Last attempt to quit: 06/27/1965    Years since quitting: 51.6  . Smokeless tobacco: Never Used  . Tobacco comment: smoked in the [employer]; 15 year approx  Substance and Sexual Activity  . Alcohol use: No  . Drug use: No  . Sexual activity: No    Birth control/protection: Post-menopausal  Other Topics Concern  . None  Social History Narrative   Tobacco use, amount per day now: 0      Past tobacco use, amount per day: Carton      How many years did you use tobacco: [place]      Alcohol use (drinks per week): 0      Diet:      Do you drink/eat things with caffeine? Yes      Marital status:  What year were you married?      Do you live in a house, apartment, assisted living, condo, trailer? Apartment       Is it one or more stories? Yes       How many persons live in your home?      Do you have any pets in your home? 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  Yes(walking)             How often?      Do you have a living will? Yes      Do you have a DNR form?  Yes          If not, do you want to discuss one?      Do you have signed POA/HPOA forms?   Yes                         Current Outpatient Medications:  .  Ascorbic Acid (VITAMIN C PO), Take 1,000 mg by mouth daily. , Disp: , Rfl:  .  CALCIUM PO, Take 600 mg by mouth daily. , Disp: , Rfl:  .  Cholecalciferol 1000 units capsule, Take 2,000 Units by mouth daily., Disp: , Rfl:  .  Coenzyme Q10 (CO Q 10 PO), Take by mouth daily., Disp: , Rfl:  .  CRANBERRY PO, Take 84 mg by mouth daily. , Disp: , Rfl:  .  docusate sodium (COLACE) 100 MG capsule,  Take 200 mg by mouth at bedtime. , Disp: , Rfl:  .  fexofenadine (ALLEGRA) 180 MG tablet, Take 180 mg by mouth daily., Disp: , Rfl:  .  Garlic 1610 MG CAPS, Take 1,000 mg by mouth daily., Disp: , Rfl:  .  hydrochlorothiazide (HYDRODIURIL) 25 MG tablet, TAKE 1 TABLET BY MOUTH ONCE DAILY, Disp: 90 tablet, Rfl: 1 .  Multiple Vitamin (MULTIVITAMIN) capsule, Take 1 capsule by mouth daily.  , Disp: , Rfl:  .  NON FORMULARY, Tumeric, Disp: , Rfl:  .  Omega-3 Fatty Acids (FISH OIL) 1200 MG CAPS, Take 1 capsule by mouth daily., Disp: , Rfl:  .  Probiotic Product (PROBIOTIC DAILY PO), Take by mouth daily., Disp: , Rfl:  .  venlafaxine (EFFEXOR) 75 MG tablet, Take 1 tablet (75 mg total) by mouth 2 (two) times daily with a meal., Disp: 180 tablet, Rfl: 1 .  albuterol (PROVENTIL HFA;VENTOLIN HFA) 108 (90 Base) MCG/ACT inhaler, Inhale 2 puffs into the lungs every 6 (six) hours as needed., Disp: 1 Inhaler, Rfl: 0 .  predniSONE (DELTASONE) 20 MG tablet, Take 2 tablets (40 mg total) by mouth daily with breakfast., Disp: 8 tablet, Rfl: 0  EXAM:  Vitals:   02/02/17 1307  BP: 118/70  Pulse: 97  Temp: 100.2 F (37.9 C)  SpO2: 93%    Body mass index is 32.49 kg/m.  GENERAL: vitals reviewed and listed above, alert, oriented, appears well hydrated and in no acute distress  HEENT: atraumatic, conjunttiva clear, no obvious abnormalities on inspection of external nose and ears  NECK: no obvious masses on inspection  LUNGS: - few scattered wheezes  CV: HRRR, no peripheral edema  MS: moves all extremities without noticeable abnormality  PSYCH: pleasant and cooperative, no obvious depression or anxiety  ASSESSMENT AND PLAN:  Discussed the following assessment and plan:  Cough - Plan: DG Chest 2 View  Respiratory illness - Plan: DG Chest  2 View   We discussed potential/likely etiologies, with bronchitis and or influenza being likely or possible vs. viral infection or other. We discussed  risks/benefits/indications/best timing for tamiflu, symptomatic care, likely course, transmission, potential complications, signs of developing a serious illness and return and emergency precuations. -CXR, flu swab, prednisone, prn alb -she declined tamiflu after discussion risks/benefits   Patient Instructions  BEFORE YOU LEAVE: -flu test -xray sheet -number for pulmonology clinic from referral  Get xray  Take the prednisone per instruction. Albuterol as needed.  I hope you are feeling better soon! Seek care promptly over the weekend if your symptoms worsen, new concerns arise or you are not improving with treatment.        Colin Benton R., DO

## 2017-02-02 ENCOUNTER — Encounter: Payer: Self-pay | Admitting: Family Medicine

## 2017-02-02 ENCOUNTER — Ambulatory Visit (INDEPENDENT_AMBULATORY_CARE_PROVIDER_SITE_OTHER): Payer: Medicare HMO | Admitting: Family Medicine

## 2017-02-02 VITALS — BP 118/70 | HR 97 | Temp 100.2°F | Ht 66.0 in | Wt 201.3 lb

## 2017-02-02 DIAGNOSIS — R05 Cough: Secondary | ICD-10-CM

## 2017-02-02 DIAGNOSIS — J989 Respiratory disorder, unspecified: Secondary | ICD-10-CM | POA: Diagnosis not present

## 2017-02-02 DIAGNOSIS — R059 Cough, unspecified: Secondary | ICD-10-CM

## 2017-02-02 LAB — POC INFLUENZA A&B (BINAX/QUICKVUE)
INFLUENZA A, POC: NEGATIVE
Influenza B, POC: NEGATIVE

## 2017-02-02 MED ORDER — ALBUTEROL SULFATE HFA 108 (90 BASE) MCG/ACT IN AERS: 2.0000 | INHALATION_SPRAY | Freq: Four times a day (QID) | RESPIRATORY_TRACT | 0 refills | Status: DC | PRN

## 2017-02-02 MED ORDER — PREDNISONE 20 MG PO TABS: 40.0000 mg | ORAL_TABLET | Freq: Every day | ORAL | 0 refills | Status: DC

## 2017-02-02 NOTE — Patient Instructions (Signed)
BEFORE YOU LEAVE: -flu test -xray sheet -number for pulmonology clinic from referral  Get xray  Take the prednisone per instruction. Albuterol as needed.  I hope you are feeling better soon! Seek care promptly over the weekend if your symptoms worsen, new concerns arise or you are not improving with treatment.

## 2017-02-02 NOTE — Addendum Note (Signed)
Addended by: Lahoma Crocker A on: 02/02/2017 02:00 PM   Modules accepted: Orders

## 2017-02-05 ENCOUNTER — Ambulatory Visit (INDEPENDENT_AMBULATORY_CARE_PROVIDER_SITE_OTHER)
Admission: RE | Admit: 2017-02-05 | Discharge: 2017-02-05 | Disposition: A | Discharge status: Home / Self Care | Payer: Medicare HMO | Source: Ambulatory Visit | Attending: Family Medicine | Admitting: Family Medicine

## 2017-02-05 DIAGNOSIS — R059 Cough, unspecified: Secondary | ICD-10-CM

## 2017-02-05 DIAGNOSIS — J989 Respiratory disorder, unspecified: Secondary | ICD-10-CM | POA: Diagnosis not present

## 2017-02-05 DIAGNOSIS — R05 Cough: Secondary | ICD-10-CM | POA: Diagnosis not present

## 2017-02-06 DIAGNOSIS — Z8249 Family history of ischemic heart disease and other diseases of the circulatory system: Secondary | ICD-10-CM | POA: Diagnosis not present

## 2017-02-06 DIAGNOSIS — Z803 Family history of malignant neoplasm of breast: Secondary | ICD-10-CM | POA: Diagnosis not present

## 2017-02-06 DIAGNOSIS — Z87891 Personal history of nicotine dependence: Secondary | ICD-10-CM | POA: Diagnosis not present

## 2017-02-06 DIAGNOSIS — K5900 Constipation, unspecified: Secondary | ICD-10-CM | POA: Diagnosis not present

## 2017-02-06 DIAGNOSIS — J309 Allergic rhinitis, unspecified: Secondary | ICD-10-CM | POA: Diagnosis not present

## 2017-02-06 DIAGNOSIS — R69 Illness, unspecified: Secondary | ICD-10-CM | POA: Diagnosis not present

## 2017-02-06 DIAGNOSIS — I10 Essential (primary) hypertension: Secondary | ICD-10-CM | POA: Diagnosis not present

## 2017-02-07 ENCOUNTER — Telehealth: Payer: Self-pay | Admitting: Family Medicine

## 2017-02-07 DIAGNOSIS — J189 Pneumonia, unspecified organism: Secondary | ICD-10-CM | POA: Diagnosis not present

## 2017-02-07 DIAGNOSIS — R0982 Postnasal drip: Secondary | ICD-10-CM | POA: Diagnosis not present

## 2017-02-07 DIAGNOSIS — R05 Cough: Secondary | ICD-10-CM | POA: Diagnosis not present

## 2017-02-07 DIAGNOSIS — R062 Wheezing: Secondary | ICD-10-CM | POA: Diagnosis not present

## 2017-02-07 NOTE — Telephone Encounter (Signed)
Copied from [place] [id number]. Topic: Referral - Request >> Feb 07, 2017  9:32 AM Ahmed Prima L wrote: Pt is requesting to see a pulmonary doctor. Call back is [phone]

## 2017-02-08 ENCOUNTER — Encounter: Payer: Self-pay | Admitting: Family Medicine

## 2017-02-09 NOTE — Telephone Encounter (Signed)
We have referred her to pulmonology already. She needs to call pulmonology to schedule appt for chronic issues. Please get her appt with me if acute issues not getting better. Thanks.

## 2017-02-09 NOTE — Telephone Encounter (Signed)
I called the pt and she stated she was seen at an urgent care on 1/9 and diagnosed with pneumonia.  Patient was informed by me of the x-ray results done by Dr Maudie Mercury on 1/7 on the same day and she is concerned.  Patient stated the urgent care gave her Levaquin, nasal spray and Ventolin inhaler. States she has no appetite and is feeling worse and does not know what to do. Denies a fever, chills nor shortness of breath. Message sent to Dr Maudie Mercury and I called the urgent care for a report.

## 2017-02-09 NOTE — Telephone Encounter (Signed)
I called the pt and informed her of the message below and she stated she is not sure what to do.  Stated she will call the pulmonologist and contact a nurse at the home where she stays at this time.

## 2017-02-10 NOTE — Progress Notes (Deleted)
HPI:  Acute visit for:  CAP: -dx several days ago at [facility] -CXR here several days prior without acute findings -chronic cough, referred to pulm for this and abd CT - she did not go  ROS: See pertinent positives and negatives per HPI.  Past Medical History:  Diagnosis Date  . Anemia   . Bleeding 1993   BTB on HRT-endo biopsy proe with focal breakdown  . Bleeding 1995   BTB on HRT-benign endo biopsy  . Breast cyst 12/95   right breast  . Colon polyps   . Depression   . History of breast cancer 2008   DCIS/invasive ductal cancer right breast ER+/PR+  . Hot flashes 02/09/2014  . Hyperglycemia 02/09/2014   Prediabetes labs 03/2013 at [facility]   . Hypertension   . Migraine   . Osteopenia   . Piriformis muscle pain   . Plantar fasciitis   . PUD (peptic ulcer disease) 8/94   Dr Magod-endoscopy  . Seasonal allergies   . Squamous cell carcinoma 6/11 7/11   on leg-removed  . Ulcer   . UTI (urinary tract infection)     Past Surgical History:  Procedure Laterality Date  . BREAST EXCISIONAL BIOPSY Right 2008   benign  . BREAST LUMPECTOMY Right 06/06/2006   right inner quadrant - invasive with sentinel node biopsy, ER/ PR +;  Her2 Nu negtive  . DILATION AND CURETTAGE OF UTERUS  50+ years ago   for retained placenta  . DILATION AND CURETTAGE OF UTERUS  1986  . KNEE ARTHROSCOPY  2016   Dr. Novella Olive    Family History  Problem Relation Age of Onset  . Ovarian cancer Mother   . Hypertension Mother   . Arthritis Father   . Cancer Father   . Heart attack Brother   . Diabetes Other        granddaughter  . Kidney failure Sister     Social History   Socioeconomic History  . Marital status: Widowed    Spouse name: Not on file  . Number of children: Not on file  . Years of education: Not on file  . Highest education level: Not on file  Social Needs  . Financial resource strain: Not on file  . Food insecurity - worry: Not on file  . Food insecurity - inability: Not on file  .  Transportation needs - medical: Not on file  . Transportation needs - non-medical: Not on file  Occupational History  . Not on file  Tobacco Use  . Smoking status: Former Smoker    Packs/day: 1.00    Years: 15.00    Pack years: 15.00    Last attempt to quit: 06/27/1965    Years since quitting: 51.6  . Smokeless tobacco: Never Used  . Tobacco comment: smoked in the [employer]; 15 year approx  Substance and Sexual Activity  . Alcohol use: No  . Drug use: No  . Sexual activity: No    Birth control/protection: Post-menopausal  Other Topics Concern  . Not on file  Social History Narrative   Tobacco use, amount per day now: 0      Past tobacco use, amount per day: Carton      How many years did you use tobacco: [place]      Alcohol use (drinks per week): 0      Diet:      Do you drink/eat things with caffeine? Yes      Marital status:  What year were you married?      Do you live in a house, apartment, assisted living, condo, trailer? Apartment       Is it one or more stories? Yes       How many persons live in your home?      Do you have any pets in your home? 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  Yes(walking)             How often?      Do you have a living will? Yes      Do you have a DNR form?  Yes          If not, do you want to discuss one?      Do you have signed POA/HPOA forms?   Yes                         Current Outpatient Medications:  .  albuterol (PROVENTIL HFA;VENTOLIN HFA) 108 (90 Base) MCG/ACT inhaler, Inhale 2 puffs into the lungs every 6 (six) hours as needed., Disp: 1 Inhaler, Rfl: 0 .  Ascorbic Acid (VITAMIN C PO), Take 1,000 mg by mouth daily. , Disp: , Rfl:  .  CALCIUM PO, Take 600 mg by mouth daily. , Disp: , Rfl:  .  Cholecalciferol 1000 units capsule, Take 2,000 Units by mouth daily., Disp: , Rfl:  .  Coenzyme Q10 (CO Q 10 PO), Take by mouth daily., Disp: , Rfl:  .  CRANBERRY PO, Take 84 mg  by mouth daily. , Disp: , Rfl:  .  docusate sodium (COLACE) 100 MG capsule, Take 200 mg by mouth at bedtime. , Disp: , Rfl:  .  fexofenadine (ALLEGRA) 180 MG tablet, Take 180 mg by mouth daily., Disp: , Rfl:  .  Garlic 3016 MG CAPS, Take 1,000 mg by mouth daily., Disp: , Rfl:  .  hydrochlorothiazide (HYDRODIURIL) 25 MG tablet, TAKE 1 TABLET BY MOUTH ONCE DAILY, Disp: 90 tablet, Rfl: 1 .  Multiple Vitamin (MULTIVITAMIN) capsule, Take 1 capsule by mouth daily.  , Disp: , Rfl:  .  NON FORMULARY, Tumeric, Disp: , Rfl:  .  Omega-3 Fatty Acids (FISH OIL) 1200 MG CAPS, Take 1 capsule by mouth daily., Disp: , Rfl:  .  predniSONE (DELTASONE) 20 MG tablet, Take 2 tablets (40 mg total) by mouth daily with breakfast., Disp: 8 tablet, Rfl: 0 .  Probiotic Product (PROBIOTIC DAILY PO), Take by mouth daily., Disp: , Rfl:  .  venlafaxine (EFFEXOR) 75 MG tablet, Take 1 tablet (75 mg total) by mouth 2 (two) times daily with a meal., Disp: 180 tablet, Rfl: 1  EXAM:  There were no vitals filed for this visit.  There is no height or weight on file to calculate BMI.  GENERAL: vitals reviewed and listed above, alert, oriented, appears well hydrated and in no acute distress  HEENT: atraumatic, conjunttiva clear, no obvious abnormalities on inspection of external nose and ears  NECK: no obvious masses on inspection  LUNGS: clear to auscultation bilaterally, no wheezes, rales or rhonchi, good air movement  CV: HRRR, no peripheral edema  MS: moves all extremities without noticeable abnormality *** PSYCH: pleasant and cooperative, no obvious depression or anxiety  ASSESSMENT AND PLAN:  Discussed the following assessment and plan:  No diagnosis found.  *** -Patient advised to return or notify a doctor immediately if  symptoms worsen or persist or new concerns arise.  There are no Patient Instructions on file for this visit.  Colin Benton R., DO

## 2017-02-12 ENCOUNTER — Telehealth: Payer: Self-pay | Admitting: Family Medicine

## 2017-02-12 ENCOUNTER — Ambulatory Visit: Payer: Medicare HMO | Admitting: Family Medicine

## 2017-02-12 NOTE — Telephone Encounter (Signed)
Copied from [place]. Topic: Quick Communication - See Telephone Encounter >> Feb 12, 2017  1:59 PM Robina Ade, Helene Kelp D wrote: Patient called and would like for Dr. Maudie Mercury to speak with her son Nathaneil Canary who is a doctor also. She is confuse about certain things and doesn't understand much about her health situation. Please call son, thanks. CRM for notification. See Telephone encounter for: 02/12/17.

## 2017-02-13 ENCOUNTER — Encounter: Payer: Self-pay | Admitting: Family Medicine

## 2017-02-13 ENCOUNTER — Ambulatory Visit (INDEPENDENT_AMBULATORY_CARE_PROVIDER_SITE_OTHER): Payer: Medicare HMO | Admitting: Family Medicine

## 2017-02-13 VITALS — BP 116/80 | HR 88 | Temp 98.2°F | Ht 66.0 in | Wt 199.1 lb

## 2017-02-13 DIAGNOSIS — E871 Hypo-osmolality and hyponatremia: Secondary | ICD-10-CM

## 2017-02-13 DIAGNOSIS — J189 Pneumonia, unspecified organism: Secondary | ICD-10-CM | POA: Diagnosis not present

## 2017-02-13 DIAGNOSIS — R413 Other amnesia: Secondary | ICD-10-CM

## 2017-02-13 DIAGNOSIS — R202 Paresthesia of skin: Secondary | ICD-10-CM

## 2017-02-13 DIAGNOSIS — R05 Cough: Secondary | ICD-10-CM | POA: Diagnosis not present

## 2017-02-13 DIAGNOSIS — R053 Chronic cough: Secondary | ICD-10-CM

## 2017-02-13 NOTE — Telephone Encounter (Signed)
LM that we got his message and to call if further questions. Neg screen for dementia at wellness visit this year. Will plan to bring up at her appt which is f/u resp infection from urgent care visit.

## 2017-02-13 NOTE — Patient Instructions (Addendum)
BEFORE YOU LEAVE: -appointment with pulmonology -lab -follow up: 3 months  See pulmonologist about chronic cough. Seek care promptly if difficulty breathing, fevers or not continuing to improve on the antibiotic.  Albuterol as needed.  Take the prednisone if wheezing.  We have ordered labs or studies at this visit. It can take up to 1-2 weeks for results and processing. IF results require follow up or explanation, we will call you with instructions. Clinically stable results will be released to your [facility]. If you have not heard from [place] or cannot find your results in [facility] in 2 weeks please contact our office at [phone].  If you are not yet signed up for [facility], please consider signing up.

## 2017-02-13 NOTE — Telephone Encounter (Signed)
I left a detailed message at the voicemail of the pts son to call back with questions for Dr Maudie Mercury.

## 2017-02-13 NOTE — Telephone Encounter (Signed)
Wendie Simmer, please call son to see what questions he has. I can call him during lunch time today or Thursday if he has time around 12:15 to talk? Looks like she is on the schedule for an appointment today. She did not show up for her appointment yesterday.

## 2017-02-13 NOTE — Progress Notes (Signed)
HPI:  Follow up CAP: -[facility] -she went there when cough worsened -she had not taken the prednisone prescribed at the last visit -has had a cough for 1 year, gradually worsening, did not respond to ppi or tx PND, nonspecific abd CT findings, referred to pulm months ago but she did not go or call to set up -now on levaquin and flonase from [facility] - doing somewhat better, but baseline cough and fatigue persist  Concern for dementia: -per son who called -per pt she could not remember how to get back from restaurant when he was visiting -he wants her to have urine test for UTI, "blood test" and evaluation with neurology for dementia -denies HA, weakness, numbness, slurred speech, fasciculations -hx MDD -reports mood stable, cog screen early 2018 ok at [facility] - but she feels she has misplaced thinks and forgets names and has for a long time   ROS: See pertinent positives and negatives per HPI.  Past Medical History:  Diagnosis Date  . Anemia   . Bleeding 1993   BTB on HRT-endo biopsy proe with focal breakdown  . Bleeding 1995   BTB on HRT-benign endo biopsy  . Breast cyst 12/95   right breast  . Colon polyps   . Depression   . History of breast cancer 2008   DCIS/invasive ductal cancer right breast ER+/PR+  . Hot flashes 02/09/2014  . Hyperglycemia 02/09/2014   Prediabetes labs 03/2013 at [facility]   . Hypertension   . Migraine   . Osteopenia   . Piriformis muscle pain   . Plantar fasciitis   . PUD (peptic ulcer disease) 8/94   Dr Magod-endoscopy  . Seasonal allergies   . Squamous cell carcinoma 6/11 7/11   on leg-removed  . Ulcer   . UTI (urinary tract infection)     Past Surgical History:  Procedure Laterality Date  . BREAST EXCISIONAL BIOPSY Right 2008   benign  . BREAST LUMPECTOMY Right 06/06/2006   right inner quadrant - invasive with sentinel node biopsy, ER/ PR +;  Her2 Nu negtive  . DILATION AND CURETTAGE OF UTERUS  50+ years ago   for retained  placenta  . DILATION AND CURETTAGE OF UTERUS  1986  . KNEE ARTHROSCOPY  2016   Dr. Novella Olive    Family History  Problem Relation Age of Onset  . Ovarian cancer Mother   . Hypertension Mother   . Arthritis Father   . Cancer Father   . Heart attack Brother   . Diabetes Other        granddaughter  . Kidney failure Sister     Social History   Socioeconomic History  . Marital status: Widowed    Spouse name: None  . Number of children: None  . Years of education: None  . Highest education level: None  Social Needs  . Financial resource strain: None  . Food insecurity - worry: None  . Food insecurity - inability: None  . Transportation needs - medical: None  . Transportation needs - non-medical: None  Occupational History  . None  Tobacco Use  . Smoking status: Former Smoker    Packs/day: 1.00    Years: 15.00    Pack years: 15.00    Last attempt to quit: 06/27/1965    Years since quitting: 51.6  . Smokeless tobacco: Never Used  . Tobacco comment: smoked in the [employer]; 15 year approx  Substance and Sexual Activity  .  Alcohol use: No  . Drug use: No  . Sexual activity: No    Birth control/protection: Post-menopausal  Other Topics Concern  . None  Social History Narrative   Tobacco use, amount per day now: 0      Past tobacco use, amount per day: Carton      How many years did you use tobacco: [place]      Alcohol use (drinks per week): 0      Diet:      Do you drink/eat things with caffeine? Yes      Marital status:             What year were you married?      Do you live in a house, apartment, assisted living, condo, trailer? Apartment       Is it one or more stories? Yes       How many persons live in your home?      Do you have any pets in your home? 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  Yes(walking)             How often?      Do you have a living will? Yes      Do you have a DNR form?  Yes          If not,  do you want to discuss one?      Do you have signed POA/HPOA forms?   Yes                         Current Outpatient Medications:  .  albuterol (PROVENTIL HFA;VENTOLIN HFA) 108 (90 Base) MCG/ACT inhaler, Inhale 2 puffs into the lungs every 6 (six) hours as needed., Disp: 1 Inhaler, Rfl: 0 .  Ascorbic Acid (VITAMIN C PO), Take 1,000 mg by mouth daily. , Disp: , Rfl:  .  CALCIUM PO, Take 600 mg by mouth daily. , Disp: , Rfl:  .  Cholecalciferol 1000 units capsule, Take 2,000 Units by mouth daily., Disp: , Rfl:  .  Coenzyme Q10 (CO Q 10 PO), Take by mouth daily., Disp: , Rfl:  .  CRANBERRY PO, Take 84 mg by mouth daily. , Disp: , Rfl:  .  docusate sodium (COLACE) 100 MG capsule, Take 200 mg by mouth at bedtime. , Disp: , Rfl:  .  fexofenadine (ALLEGRA) 180 MG tablet, Take 180 mg by mouth daily., Disp: , Rfl:  .  fluticasone (FLONASE) 50 MCG/ACT nasal spray, , Disp: , Rfl:  .  Garlic 1610 MG CAPS, Take 1,000 mg by mouth daily., Disp: , Rfl:  .  hydrochlorothiazide (HYDRODIURIL) 25 MG tablet, TAKE 1 TABLET BY MOUTH ONCE DAILY, Disp: 90 tablet, Rfl: 1 .  levofloxacin (LEVAQUIN) 750 MG tablet, , Disp: , Rfl:  .  Multiple Vitamin (MULTIVITAMIN) capsule, Take 1 capsule by mouth daily.  , Disp: , Rfl:  .  NON FORMULARY, Tumeric, Disp: , Rfl:  .  Omega-3 Fatty Acids (FISH OIL) 1200 MG CAPS, Take 1 capsule by mouth daily., Disp: , Rfl:  .  predniSONE (DELTASONE) 20 MG tablet, Take 2 tablets (40 mg total) by mouth daily with breakfast., Disp: 8 tablet, Rfl: 0 .  Probiotic Product (PROBIOTIC DAILY PO), Take by mouth daily., Disp: , Rfl:  .  venlafaxine (EFFEXOR) 75 MG tablet, Take 1 tablet (75 mg total) by mouth 2 (  two) times daily with a meal., Disp: 180 tablet, Rfl: 1  EXAM:  Vitals:   02/13/17 1505  BP: 116/80  Pulse: 88  Temp: 98.2 F (36.8 C)  SpO2: 97%    Body mass index is 32.14 kg/m.  GENERAL: vitals reviewed and listed above, alert, oriented, appears well hydrated and in no  acute distress  HEENT: atraumatic, conjunttiva clear, no obvious abnormalities on inspection of external nose and ears  NECK: no obvious masses on inspection  LUNGS: Few scattered expiratory wheezes, no rales or rhonchi, good air movement  CV: HRRR, no peripheral edema  MS: moves all extremities without noticeable abnormality  PSYCH: pleasant and cooperative, no obvious depression or anxiety, oriented x3, speech and thought processing grossly intact  ASSESSMENT AND PLAN:  Discussed the following assessment and plan:  Community acquired pneumonia, unspecified laterality  Chronic cough  Memory loss - Plan: Basic metabolic panel, CBC, Hemoglobin A1c, Ambulatory referral to Neurology  Paresthesia - Plan: TSH, Vitamin B12, Ambulatory referral to Neurology  -She seems to think the pneumonia is improving, she is to complete the course of the antibiotic, since that albuterol seems to help cough, okay to take per instructions and also okay to take the prednisone -of course advised her to seek care immediately if she has a worsening of symptoms, fevers or difficulty breathing -She really wants to see a pulmonologist soon, will have my assistant call their office to see if they can help her with obtaining an appointment, she had declined this evaluation in the past, and then never called to set up an appointment -Send a referral to the neurologist per her son's request regarding evaluation for dementia, labs per orders -Patient advised to return or notify a doctor immediately if symptoms worsen or persist or new concerns arise.  Patient Instructions  BEFORE YOU LEAVE: -appointment with pulmonology -lab -follow up: 3 months  See pulmonologist about chronic cough. Seek care promptly if difficulty breathing, fevers or not continuing to improve on the antibiotic.  Albuterol as needed.  Take the prednisone if wheezing.  We have ordered labs or studies at this visit. It can take up to 1-2  weeks for results and processing. IF results require follow up or explanation, we will call you with instructions. Clinically stable results will be released to your [facility]. If you have not heard from [place] or cannot find your results in [facility] in 2 weeks please contact our office at [phone].  If you are not yet signed up for [facility], please consider signing up.          Colin Benton R., DO

## 2017-02-13 NOTE — Telephone Encounter (Signed)
Copied from [place]. Topic: Quick Communication - Office Called Patient >> Feb 13, 2017  9:08 AM Christine Maldonado, CMA wrote: Reason for CRM: please provide questions for Dr Maudie Mercury. >> Feb 13, 2017  9:32 AM Christine Maldonado, NT wrote: Talked with son said there was no questions his mother just needs a medical work up she has been demented. Pt son Marden Noble can be reached at [phone] (9:30-10:30) or any other time after 12:30 today. Pt. Has an appt. Today 02-13-17 at 3pm with Dr. Maudie Mercury

## 2017-02-14 ENCOUNTER — Encounter: Payer: Self-pay | Admitting: Neurology

## 2017-02-14 LAB — BASIC METABOLIC PANEL
BUN: 21 mg/dL (ref 6–23)
CHLORIDE: 87 meq/L — AB (ref 96–112)
CO2: 34 meq/L — AB (ref 19–32)
CREATININE: 0.92 mg/dL (ref 0.40–1.20)
Calcium: 8.9 mg/dL (ref 8.4–10.5)
GFR: 61.44 mL/min (ref >60.00)
Glucose, Bld: 93 mg/dL (ref 70–99)
Potassium: 3.6 mEq/L (ref 3.5–5.1)
Sodium: 128 mEq/L — ABNORMAL LOW (ref 135–145)

## 2017-02-14 LAB — CBC
HEMATOCRIT: 37.6 % (ref 36.0–46.0)
Hemoglobin: 12.5 g/dL (ref 12.0–15.0)
MCHC: 33.3 g/dL (ref 30.0–36.0)
MCV: 91.9 fl (ref 78.0–100.0)
Platelets: 374 10*3/uL (ref 150.0–400.0)
RBC: 4.09 Mil/uL (ref 3.87–5.11)
RDW: 13.3 % (ref 11.5–15.5)
WBC: 7.8 10*3/uL (ref 4.0–10.5)

## 2017-02-14 LAB — TSH: TSH: 2.38 u[IU]/mL (ref 0.35–4.50)

## 2017-02-14 LAB — VITAMIN B12: Vitamin B-12: 799 pg/mL (ref 211–911)

## 2017-02-14 LAB — HEMOGLOBIN A1C: HEMOGLOBIN A1C: 6.3 % (ref 4.6–6.5)

## 2017-02-20 MED ORDER — AMLODIPINE BESYLATE 5 MG PO TABS: 5.0000 mg | ORAL_TABLET | Freq: Every day | ORAL | 3 refills | Status: DC

## 2017-02-20 NOTE — Addendum Note (Signed)
Addended by: Agnes Lawrence on: 02/20/2017 11:34 AM   Modules accepted: Orders

## 2017-02-22 ENCOUNTER — Ambulatory Visit: Payer: Self-pay | Admitting: *Deleted

## 2017-02-22 ENCOUNTER — Telehealth: Payer: Self-pay | Admitting: Family Medicine

## 2017-02-22 NOTE — Telephone Encounter (Signed)
rec'd call from the pt.  Reported she was not sure if she took her HCTZ today, and doesn't know if she should start taking her new BP medication, Amlodipine, today.  Noted on 02/13/17 labs that Dr. Maudie Mercury d/c'd her HCTZ, and started Amlodipine 5 mg.   Questioned pt. Is she has any way to check her blood pressure?  Reported she is at [facility] and can have someone check it there.  Pt. Questioned what recommendations to take or not take her Amlodipine today.  Advised if her BP is under 130/80, she can hold the Amlodipine today and start it tomorrow.  Advised if the BP is greater than 130/80 to take her Amlodipine today.  Pt. verb. Understanding.

## 2017-02-22 NOTE — Telephone Encounter (Signed)
Pt called because she is having some right neck pain that sometimes go into her shoulder and causes her right hand to become numb or feels like it has gone to sleep. She is not sure how long it has been going on. Denies weakness or pain in her arm. Appointment made for tomorrow. Home care advice given with verbal understanding.  Reason for Disposition . Numbness in an arm or hand (i.e., loss of sensation)  Answer Assessment - Initial Assessment Questions 1. ONSET: "When did the pain begin?"      Not sure 2. LOCATION: "Where does it hurt?"      Right neck 3. PATTERN "Does the pain come and go, or has it been constant since it started?"      Comes and goes 4. SEVERITY: "How bad is the pain?"  (Scale 1-10; or mild, moderate, severe)   - MILD (1-3): doesn't interfere with normal activities    - MODERATE (4-7): interferes with normal activities or awakens from sleep    - SEVERE (8-10):  excruciating pain, unable to do any normal activities      None right 5. RADIATION: "Does the pain go anywhere else, shoot into your arms?"     no 6. CORD SYMPTOMS: "Any weakness or numbness of the arms or legs?"     no 7. CAUSE: "What do you think is causing the neck pain?"     Pinched nerve. 8. NECK OVERUSE: "Any recent activities that involved turning or twisting the neck?"     no 9. OTHER SYMPTOMS: "Do you have any other symptoms?" (e.g., headache, fever, chest pain, difficulty breathing, neck swelling)     no 10. PREGNANCY: "Is there any chance you are pregnant?" "When was your last menstrual period?"       n/a  Protocols used: NECK PAIN OR STIFFNESS-A-AH

## 2017-02-23 ENCOUNTER — Ambulatory Visit (INDEPENDENT_AMBULATORY_CARE_PROVIDER_SITE_OTHER): Payer: Medicare HMO | Admitting: Family Medicine

## 2017-02-23 ENCOUNTER — Encounter: Payer: Self-pay | Admitting: Family Medicine

## 2017-02-23 VITALS — BP 130/78 | HR 90 | Temp 98.8°F | Wt 199.6 lb

## 2017-02-23 DIAGNOSIS — R05 Cough: Secondary | ICD-10-CM | POA: Diagnosis not present

## 2017-02-23 DIAGNOSIS — R0982 Postnasal drip: Secondary | ICD-10-CM | POA: Diagnosis not present

## 2017-02-23 DIAGNOSIS — R059 Cough, unspecified: Secondary | ICD-10-CM

## 2017-02-23 NOTE — Progress Notes (Signed)
Subjective:    Patient ID: Christine Maldonado, female    DOB: 1928/11/24, 84 y.o.   MRN: [id number]  Chief Complaint  Patient presents with  . Cough  Pt is accompanied by a health aid from her facility.  HPI Patient was seen today for acute concerns.  Pt recently completed a course of levoquin for pneumonia.  Pt states she is still coughing, but is slowly starting to feel better.  Pt has continued to use flonase and "a throat spray".  The "throat spray" pt is referring to is actually an Albuterol inhaler.  Pt has a list of chronic concerns with her.  Pt mentions paraesthesias in R UE.  States she can make the feeling happen, but can also make it go away by moving her hand.  Pt also mentions ongoing memory issues.  Pt was reminded that a referral was made by her PCP for her to see the Neurologist for these issues.  Pt also mentions she was not taking her bp meds or her depression medicine for a while.  Pt is unable to quantify how long a while means.  She states she has since restarted these meds.  Pt states she was taken off HCTZ and is now only on Norvasc.  Past Medical History:  Diagnosis Date  . Anemia   . Bleeding 1993   BTB on HRT-endo biopsy proe with focal breakdown  . Bleeding 1995   BTB on HRT-benign endo biopsy  . Breast cyst 12/95   right breast  . Colon polyps   . Depression   . History of breast cancer 2008   DCIS/invasive ductal cancer right breast ER+/PR+  . Hot flashes 02/09/2014  . Hyperglycemia 02/09/2014   Prediabetes labs 03/2013 at [facility]   . Hypertension   . Migraine   . Osteopenia   . Piriformis muscle pain   . Plantar fasciitis   . PUD (peptic ulcer disease) 8/94   Dr Magod-endoscopy  . Seasonal allergies   . Squamous cell carcinoma 6/11 7/11   on leg-removed  . Ulcer   . UTI (urinary tract infection)     Allergies  Allergen Reactions  . Amoxicillin     GI upset  . Asa [Aspirin]     Had ulcer/ told not to take  . Erythrocin   . Diflucan  [Fluconazole] Rash  . Nitrofuran Derivatives Rash    ROS General: Denies fever, chills, night sweats, changes in weight, changes in appetite HEENT: Denies headaches, ear pain, changes in vision, rhinorrhea, sore throat CV: Denies CP, palpitations, SOB, orthopnea Pulm: Denies SOB, wheezing  +cough GI: Denies abdominal pain, nausea, vomiting, diarrhea, constipation GU: Denies dysuria, hematuria, frequency, vaginal discharge Msk: Denies muscle cramps, joint pains Neuro: Denies weakness    + numbness, tingling in RUE Skin: Denies rashes, bruising Psych: Denies anxiety, hallucinations  +depression     Objective:    Blood pressure 130/78, pulse 90, temperature 98.8 F (37.1 C), temperature source Oral, weight 199 lb 9.6 oz (90.5 kg), last menstrual period 01/30/1993, SpO2 95 %.   Gen. Pleasant, well-nourished, in no distress, normal affect   HEENT: [place]/AT, face symmetric, no scleral icterus, PERRLA, nares patent without drainage, pharynx with post nasal drainage, no erythema or exudate. Lungs: no accessory muscle use, CTAB, no wheezes or rales Cardiovascular: RRR, no m/r/g, no peripheral edema Abdomen: BS present, soft, NT/ND    Wt Readings from Last 3 Encounters:  02/23/17 199 lb 9.6 oz (90.5 kg)  02/13/17 199 lb 1.6  oz (90.3 kg)  02/02/17 201 lb 4.8 oz (91.3 kg)    Lab Results  Component Value Date   WBC 7.8 02/13/2017   HGB 12.5 02/13/2017   HCT 37.6 02/13/2017   PLT 374.0 02/13/2017   GLUCOSE 93 02/13/2017   CHOL 216 (H) 01/12/2016   TRIG 83.0 01/12/2016   HDL 83.50 01/12/2016   LDLCALC 116 (H) 01/12/2016   ALT 15 11/18/2008   AST 30 11/18/2008   NA 128 (L) 02/13/2017   K 3.6 02/13/2017   CL 87 (L) 02/13/2017   CREATININE 0.92 02/13/2017   BUN 21 02/13/2017   CO2 34 (H) 02/13/2017   TSH 2.38 02/13/2017   HGBA1C 6.3 02/13/2017    Assessment/Plan:  Cough -discussed cough may linger after pneumonia.   -pt also advised cough may be 2/2 post nasal  drainage. -supportive care.  Drinking warm liquids, cough drops, cough syrup if needed.  Postnasal drip -likely contributing to cough -continue using flonase and allegra  F/u prn   Grier Mitts, MD

## 2017-02-23 NOTE — Patient Instructions (Addendum)
Cough, Adult Coughing is a reflex that clears your throat and your airways. Coughing helps to heal and protect your lungs. It is normal to cough occasionally, but a cough that happens with other symptoms or lasts a long time may be a sign of a condition that needs treatment. A cough may last only 2-3 weeks (acute), or it may last longer than 8 weeks (chronic). What are the causes? Coughing is commonly caused by:  Breathing in substances that irritate your lungs.  A viral or bacterial respiratory infection.  Allergies.  Asthma.  Postnasal drip.  Smoking.  Acid backing up from the stomach into the esophagus (gastroesophageal reflux).  Certain medicines.  Chronic lung problems, including COPD (or rarely, lung cancer).  Other medical conditions such as heart failure.  Follow these instructions at home: Pay attention to any changes in your symptoms. Take these actions to help with your discomfort:  Take medicines only as told by your health care provider. ? If you were prescribed an antibiotic medicine, take it as told by your health care provider. Do not stop taking the antibiotic even if you start to feel better. ? Talk with your health care provider before you take a cough suppressant medicine.  Drink enough fluid to keep your urine clear or pale yellow.  If the air is dry, use a cold steam vaporizer or humidifier in your bedroom or your home to help loosen secretions.  Avoid anything that causes you to cough at work or at home.  If your cough is worse at night, try sleeping in a semi-upright position.  Avoid cigarette smoke. If you smoke, quit smoking. If you need help quitting, ask your health care provider.  Avoid caffeine.  Avoid alcohol.  Rest as needed.  Contact a health care provider if:  You have new symptoms.  You cough up pus.  Your cough does not get better after 2-3 weeks, or your cough gets worse.  You cannot control your cough with suppressant  medicines and you are losing sleep.  You develop pain that is getting worse or pain that is not controlled with pain medicines.  You have a fever.  You have unexplained weight loss.  You have night sweats. Get help right away if:  You cough up blood.  You have difficulty breathing.  Your heartbeat is very fast. This information is not intended to replace advice given to you by your health care provider. Make sure you discuss any questions you have with your health care provider. Document Released: 07/15/2010 Document Revised: 06/24/2015 Document Reviewed: 03/25/2014 Elsevier Interactive Patient Education  2018 Elsevier Inc.  Postnasal Drip Postnasal drip is the feeling of mucus going down the back of your throat. Mucus is a slimy substance that moistens and cleans your nose and throat, as well as the air pockets in face bones near your forehead and cheeks (sinuses). Small amounts of mucus pass from your nose and sinuses down the back of your throat all the time. This is normal. When you produce too much mucus or the mucus gets too thick, you can feel it. Some common causes of postnasal drip include:  Having more mucus because of: ? A cold or the flu. ? Allergies. ? Cold air. ? Certain medicines.  Having more mucus that is thicker because of: ? A sinus or nasal infection. ? Dry air. ? A food allergy.  Follow these instructions at home: Relieving discomfort  Gargle with a salt-water mixture 3-4 times a day or as   needed. To make a salt-water mixture, completely dissolve -1 tsp of salt in 1 cup of warm water.  If the air in your home is dry, use a humidifier to add moisture to the air.  Use a saline spray or container (neti pot) to flush out the nose (nasal irrigation). These methods can help clear away mucus and keep the nasal passages moist. General instructions  Take over-the-counter and prescription medicines only as told by your health care provider.  Follow  instructions from your health care provider about eating or drinking restrictions. You may need to avoid caffeine.  Avoid things that you know you are allergic to (allergens), like dust, mold, pollen, pets, or certain foods.  Drink enough fluid to keep your urine pale yellow.  Keep all follow-up visits as told by your health care provider. This is important. Contact a health care provider if:  You have a fever.  You have a sore throat.  You have difficulty swallowing.  You have headache.  You have sinus pain.  You have a cough that does not go away.  The mucus from your nose becomes thick and is green or yellow in color.  You have cold or flu symptoms that last more than 10 days. Summary  Postnasal drip is the feeling of mucus going down the back of your throat.  If your health care provider approves, use nasal irrigation or a nasal spray 2?4 times a day.  Avoid things that you know you are allergic to (allergens), like dust, mold, pollen, pets, or certain foods. This information is not intended to replace advice given to you by your health care provider. Make sure you discuss any questions you have with your health care provider. Document Released: 05/01/2016 Document Revised: 05/01/2016 Document Reviewed: 05/01/2016 Elsevier Interactive Patient Education  2018 Elsevier Inc.  

## 2017-02-26 ENCOUNTER — Telehealth: Payer: Self-pay | Admitting: Family Medicine

## 2017-02-26 ENCOUNTER — Ambulatory Visit: Payer: Medicare HMO | Admitting: Family Medicine

## 2017-02-26 NOTE — Telephone Encounter (Signed)
Copied from [place] [id number]. Topic: Quick Communication - See Telephone Encounter >> Feb 26, 2017  2:28 PM Ether Griffins B wrote: CRM for notification. See Telephone encounter for:  Pt calling back in to give Wendie Simmer her the phone number for the [facility] [phone]. They are located in [place] and they deliver to [place]. Pt social is [phone]. Tel phone # [phone]. DOB July 26, 2028  02/26/17.

## 2017-02-27 ENCOUNTER — Telehealth: Payer: Self-pay | Admitting: *Deleted

## 2017-02-27 NOTE — Telephone Encounter (Signed)
Patient is concerned about a $50 no-show fee bill she received from 02/01/2017.  Patient stated she was very sick, does not recall scheduling an appt for this day and does not feel she should have to pay this.  Message sent to the office manager Dustin as the pt stated she would like to speak with him.

## 2017-02-27 NOTE — Telephone Encounter (Signed)
I called the pt to ask what medications she needed to be refilled from the [place].  Patient stated her caregiver called about Effexor as they have not heard anything in regards to her changing the dose since the report from abnormal labs (see results note in which pt is to take 1.5 tablets vs 2).  I called the [facility] at the number below and spoke with Lesly Rubenstein and she stated she does not see a note about this for the pt, stated she will forward this message to Dr Milas Hock as the pt is seen there at the [facility] and a nurse will contact the pt.  I also informed Lesly Rubenstein the pt is almost out of her medication and she stated a refill was mailed out on 1/26.  I called the pt and informed her of this as well and asked that she call the [facility] if she has not heard anything and she agreed.

## 2017-02-28 NOTE — Telephone Encounter (Signed)
[facility] is calling in regards to this. Please Norene @ [place] [phone] ext [phone]

## 2017-03-01 NOTE — Telephone Encounter (Signed)
I left a message for Christine Maldonado to return my call.

## 2017-03-02 ENCOUNTER — Ambulatory Visit: Payer: Medicare HMO | Admitting: Emergency Medicine

## 2017-03-02 ENCOUNTER — Encounter: Payer: Self-pay | Admitting: Emergency Medicine

## 2017-03-02 DIAGNOSIS — R05 Cough: Secondary | ICD-10-CM | POA: Diagnosis not present

## 2017-03-02 DIAGNOSIS — R053 Chronic cough: Secondary | ICD-10-CM | POA: Insufficient documentation

## 2017-03-02 MED ORDER — ESOMEPRAZOLE MAGNESIUM 40 MG PO CPDR: 40.0000 mg | DELAYED_RELEASE_CAPSULE | Freq: Every day | ORAL | 2 refills | Status: DC

## 2017-03-02 NOTE — Patient Instructions (Signed)
Please continue Allegra 180 mg once a day. Increase Flonase to 2 sprays each nostril twice a day until our next visit. Please start Nexium 40 mg once a day until next visit.  Take this medication 1 hour before or 1 hour after eating. Try hard to avoid throat clearing.  Use a menthol free cough drop or a sugar-free candy to help with this We will perform pulmonary function testing at your next visit. Depending on your cough improvement and your other evaluation we may decide to do other testing such as a repeat CT scan of your chest. Follow with Dr Lamonte Sakai in 1 month or next available

## 2017-03-02 NOTE — Assessment & Plan Note (Signed)
Clear trigger from chronic allergic rhinitis.  Unclear whether she has any GERD.  I think it would be reasonable to treat this empirically.  She needs pulmonary function testing given her tobacco history.  She has some micronodular disease on CT scan of the chest of unclear significance.  Could represent mycobacterial disease, colonization.  Depending on her improvement, other workup we may decide to repeat her CT scan of the chest to look for interval change.   Please continue Allegra 180 mg once a day. Increase Flonase to 2 sprays each nostril twice a day until our next visit. Please start Nexium 40 mg once a day until next visit.  Take this medication 1 hour before or 1 hour after eating. Try hard to avoid throat clearing.  Use a menthol free cough drop or a sugar-free candy to help with this We will perform pulmonary function testing at your next visit. Depending on your cough improvement and your other evaluation we may decide to do other testing such as a repeat CT scan of your chest. Follow with Dr Lamonte Sakai in 1 month or next available

## 2017-03-02 NOTE — Progress Notes (Signed)
Subjective:    Patient ID: Christine Maldonado, female    DOB: 07-24-1928, 84 y.o.   MRN: [id number]  HPI 84 year old former smoker with a history of breast cancer, hypertension, hyperglycemia, anemia.  She was apparently treated early January for a community-acquired pneumonia in the setting of wheeze, congestion, cough.  Chest x-ray done 02/05/17 did not show any infiltrates. She is here with chronic cough - has been present for over a year. Happens with talking, not present all the time or even every day. She does clear her throat some. She had a recent worsening beginning of January - low appetite, lethargy, felt "out of it", went off of her meds for a week also (HTN, SSRI). She can't recall if her cough changed during this episode - it may have been productive/. She went to urgent care surrounding this episode and was treated for possible CAP > CXR didn't show an infiltrate. She took the abx but not the prednisone. She is on allegra, flonase. Denies GERD, is on fish oil. Her cough appears to be at baseline - a lot of throat clearing. Cough is non-productive.   CT chest 10/09/16 was reviewed by me.  There were no nodules seen, enlarged pulmonary artery spiculated possible pulmonary arterial hypertension.  There was an area of clustered micro-nodularity in the right upper and right lower lobes, question effects of prior inflammation.   Review of Systems  HENT: Positive for postnasal drip.   Respiratory: Positive for cough and wheezing.   Allergic/Immunologic: Negative.   Hematological: Bruises/bleeds easily.   Past Medical History:  Diagnosis Date  . Anemia   . Bleeding 1993   BTB on HRT-endo biopsy proe with focal breakdown  . Bleeding 1995   BTB on HRT-benign endo biopsy  . Breast cyst 12/95   right breast  . Colon polyps   . Depression   . History of breast cancer 2008   DCIS/invasive ductal cancer right breast ER+/PR+  . Hot flashes 02/09/2014  . Hyperglycemia 02/09/2014   Prediabetes labs 03/2013 at [facility]   . Hypertension   . Migraine   . Osteopenia   . Piriformis muscle pain   . Plantar fasciitis   . PUD (peptic ulcer disease) 8/94   Dr Magod-endoscopy  . Seasonal allergies   . Squamous cell carcinoma 6/11 7/11   on leg-removed  . Ulcer   . UTI (urinary tract infection)      Family History  Problem Relation Age of Onset  . Ovarian cancer Mother   . Hypertension Mother   . Arthritis Father   . Cancer Father   . Heart attack Brother   . Diabetes Other        granddaughter  . Kidney failure Sister      Social History   Socioeconomic History  . Marital status: Widowed    Spouse name: Not on file  . Number of children: Not on file  . Years of education: Not on file  . Highest education level: Not on file  Social Needs  . Financial resource strain: Not on file  . Food insecurity - worry: Not on file  . Food insecurity - inability: Not on file  . Transportation needs - medical: Not on file  . Transportation needs - non-medical: Not on file  Occupational History  . Not on file  Tobacco Use  . Smoking status: Former Smoker    Packs/day: 1.00    Years: 15.00    Pack years: 15.00  Last attempt to quit: 06/27/1965    Years since quitting: 51.7  . Smokeless tobacco: Never Used  . Tobacco comment: smoked in the [employer]; 15 year approx  Substance and Sexual Activity  . Alcohol use: No  . Drug use: No  . Sexual activity: No    Birth control/protection: Post-menopausal  Other Topics Concern  . Not on file  Social History Narrative   Tobacco use, amount per day now: 0      Past tobacco use, amount per day: Carton      How many years did you use tobacco: [place]      Alcohol use (drinks per week): 0      Diet:      Do you drink/eat things with caffeine? Yes      Marital status:             What year were you married?      Do you live in a house, apartment, assisted living, condo, trailer? Apartment       Is it one or more  stories? Yes       How many persons live in your home?      Do you have any pets in your home? 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  Yes(walking)             How often?      Do you have a living will? Yes      Do you have a DNR form?  Yes          If not, do you want to discuss one?      Do you have signed POA/HPOA forms?   Yes                         Allergies  Allergen Reactions  . Amoxicillin     GI upset  . Asa [Aspirin]     Had ulcer/ told not to take  . Erythrocin   . Diflucan [Fluconazole] Rash  . Nitrofuran Derivatives Rash     Outpatient Medications Prior to Visit  Medication Sig Dispense Refill  . amLODipine (NORVASC) 5 MG tablet Take 1 tablet (5 mg total) by mouth daily. 30 tablet 3  . Ascorbic Acid (VITAMIN C PO) Take 1,000 mg by mouth daily.     Marland Kitchen CALCIUM PO Take 600 mg by mouth daily.     . Cholecalciferol 1000 units capsule Take 2,000 Units by mouth daily.    . Coenzyme Q10 (CO Q 10 PO) Take by mouth daily.    Marland Kitchen CRANBERRY PO Take 84 mg by mouth daily.     Marland Kitchen docusate sodium (COLACE) 100 MG capsule Take 200 mg by mouth at bedtime.     . fexofenadine (ALLEGRA) 180 MG tablet Take 180 mg by mouth daily.    . Garlic 3382 MG CAPS Take 1,000 mg by mouth daily.    . Multiple Vitamin (MULTIVITAMIN) capsule Take 1 capsule by mouth daily.      . NON FORMULARY Tumeric    . Omega-3 Fatty Acids (FISH OIL) 1200 MG CAPS Take 1 capsule by mouth daily.    . Probiotic Product (PROBIOTIC DAILY PO) Take by mouth daily.    Marland Kitchen venlafaxine (EFFEXOR) 75 MG tablet Take 1 tablet (75 mg total) by mouth 2 (two) times daily with a meal. (Patient taking differently: Take  75 mg by mouth daily. ) 180 tablet 1  . albuterol (PROVENTIL HFA;VENTOLIN HFA) 108 (90 Base) MCG/ACT inhaler Inhale 2 puffs into the lungs every 6 (six) hours as needed. (Patient not taking: Reported on 03/02/2017) 1 Inhaler 0  . fluticasone (FLONASE) 50 MCG/ACT nasal spray     .  predniSONE (DELTASONE) 20 MG tablet Take 2 tablets (40 mg total) by mouth daily with breakfast. (Patient not taking: Reported on 03/02/2017) 8 tablet 0  . hydrochlorothiazide (HYDRODIURIL) 25 MG tablet TAKE 1 TABLET BY MOUTH ONCE DAILY (Patient not taking: Reported on 03/02/2017) 90 tablet 1   No facility-administered medications prior to visit.         Objective:   Physical Exam Vitals:   03/02/17 1042  BP: 140/80  Pulse: 94  SpO2: 94%  Weight: 198 lb (89.8 kg)  Height: 5\' 6"  (1.676 m)   Gen: Pleasant, well-nourished, in no distress,  normal affect  ENT: No lesions,  mouth clear,  oropharynx clear, no postnasal drip  Neck: No JVD, no stridor  Lungs: No use of accessory muscles, clear without rales or rhonchi  Cardiovascular: RRR, heart sounds normal, no murmur or gallops, no peripheral edema  Musculoskeletal: No deformities, no cyanosis or clubbing  Neuro: alert, non focal  Skin: Warm, no lesions or rash      Assessment & Plan:  Chronic cough Clear trigger from chronic allergic rhinitis.  Unclear whether she has any GERD.  I think it would be reasonable to treat this empirically.  She needs pulmonary function testing given her tobacco history.  She has some micronodular disease on CT scan of the chest of unclear significance.  Could represent mycobacterial disease, colonization.  Depending on her improvement, other workup we may decide to repeat her CT scan of the chest to look for interval change.   Please continue Allegra 180 mg once a day. Increase Flonase to 2 sprays each nostril twice a day until our next visit. Please start Nexium 40 mg once a day until next visit.  Take this medication 1 hour before or 1 hour after eating. Try hard to avoid throat clearing.  Use a menthol free cough drop or a sugar-free candy to help with this We will perform pulmonary function testing at your next visit. Depending on your cough improvement and your other evaluation we may decide to  do other testing such as a repeat CT scan of your chest. Follow with Dr Lamonte Sakai in 1 month or next available    Baltazar Apo, MD, PhD 03/02/2017, 11:20 AM [facility] [phone] or if no answer [phone]

## 2017-03-02 NOTE — Telephone Encounter (Signed)
Norene Swing would like a call back from [place]. Call back is [phone]

## 2017-03-05 ENCOUNTER — Encounter: Payer: Self-pay | Admitting: Family Medicine

## 2017-03-05 ENCOUNTER — Telehealth: Payer: Self-pay | Admitting: Emergency Medicine

## 2017-03-05 ENCOUNTER — Other Ambulatory Visit (INDEPENDENT_AMBULATORY_CARE_PROVIDER_SITE_OTHER): Payer: Medicare HMO

## 2017-03-05 ENCOUNTER — Ambulatory Visit (INDEPENDENT_AMBULATORY_CARE_PROVIDER_SITE_OTHER): Payer: Medicare HMO | Admitting: Family Medicine

## 2017-03-05 VITALS — BP 120/70 | HR 85 | Temp 98.1°F | Wt 198.7 lb

## 2017-03-05 DIAGNOSIS — R200 Anesthesia of skin: Secondary | ICD-10-CM | POA: Diagnosis not present

## 2017-03-05 DIAGNOSIS — E871 Hypo-osmolality and hyponatremia: Secondary | ICD-10-CM

## 2017-03-05 DIAGNOSIS — M5412 Radiculopathy, cervical region: Secondary | ICD-10-CM

## 2017-03-05 LAB — BASIC METABOLIC PANEL
BUN: 17 mg/dL (ref 6–23)
CALCIUM: 9 mg/dL (ref 8.4–10.5)
CO2: 30 meq/L (ref 19–32)
CREATININE: 0.79 mg/dL (ref 0.40–1.20)
Chloride: 100 mEq/L (ref 96–112)
GFR: 73.24 mL/min (ref >60.00)
GLUCOSE: 92 mg/dL (ref 70–99)
Potassium: 4.2 mEq/L (ref 3.5–5.1)
Sodium: 136 mEq/L (ref 135–145)

## 2017-03-05 MED ORDER — PREDNISONE 10 MG PO TABS
ORAL_TABLET | ORAL | 0 refills | Status: DC
Start: 2017-03-05 — End: 2017-03-12

## 2017-03-05 MED ORDER — ESOMEPRAZOLE MAGNESIUM 40 MG PO CPDR: 40.0000 mg | DELAYED_RELEASE_CAPSULE | Freq: Every day | ORAL | 2 refills | Status: DC

## 2017-03-05 NOTE — Telephone Encounter (Signed)
Patient called back regarding [place] phone note earlier. Patient is aware that we have sent over needed information to the [facility] and her PCP there so that she can get her refill of nexium. Nothing further needed.

## 2017-03-05 NOTE — Progress Notes (Signed)
Subjective:     Patient ID: Christine Maldonado, female   DOB: 12-Jul-1928, 84 y.o.   MRN: [id number]  HPI Patient sees a work in today with new complaint which is 2 week history of some right neck and right upper extremity pain. She also has some numbness involving the right hand. Her symptoms are somewhat intermittent. She denies any recent injury. She describes achy pain 6 out of 10 severity worse when she rotates the head to the right side. She's not had any weakness but his had some intermittent numbness involving mostly the right thumb. No prior history of any neck surgery. Denies similar problem previously. No alleviating factors.  Past Medical History:  Diagnosis Date  . Anemia   . Bleeding 1993   BTB on HRT-endo biopsy proe with focal breakdown  . Bleeding 1995   BTB on HRT-benign endo biopsy  . Breast cyst 12/95   right breast  . Colon polyps   . Depression   . History of breast cancer 2008   DCIS/invasive ductal cancer right breast ER+/PR+  . Hot flashes 02/09/2014  . Hyperglycemia 02/09/2014   Prediabetes labs 03/2013 at [facility]   . Hypertension   . Migraine   . Osteopenia   . Piriformis muscle pain   . Plantar fasciitis   . PUD (peptic ulcer disease) 8/94   Dr Magod-endoscopy  . Seasonal allergies   . Squamous cell carcinoma 6/11 7/11   on leg-removed  . Ulcer   . UTI (urinary tract infection)    Past Surgical History:  Procedure Laterality Date  . BREAST EXCISIONAL BIOPSY Right 2008   benign  . BREAST LUMPECTOMY Right 06/06/2006   right inner quadrant - invasive with sentinel node biopsy, ER/ PR +;  Her2 Nu negtive  . DILATION AND CURETTAGE OF UTERUS  50+ years ago   for retained placenta  . DILATION AND CURETTAGE OF UTERUS  1986  . KNEE ARTHROSCOPY  2016   Dr. Novella Olive    reports that she quit smoking about 51 years ago. She has a 15.00 pack-year smoking history. she has never used smokeless tobacco. She reports that she does not drink alcohol or use drugs. family  history includes Arthritis in her father; Cancer in her father; Diabetes in her other; Heart attack in her brother; Hypertension in her mother; Kidney failure in her sister; Ovarian cancer in her mother. Allergies  Allergen Reactions  . Amoxicillin     GI upset  . Asa [Aspirin]     Had ulcer/ told not to take  . Erythrocin   . Diflucan [Fluconazole] Rash  . Nitrofuran Derivatives Rash     Review of Systems  Constitutional: Negative for chills and fever.  Respiratory: Negative for cough and shortness of breath.   Cardiovascular: Negative for chest pain.  Musculoskeletal: Positive for neck pain. Negative for back pain.  Neurological: Positive for numbness. Negative for weakness.       Objective:   Physical Exam  Constitutional: She appears well-developed and well-nourished.  Cardiovascular: Normal rate and regular rhythm.  Pulmonary/Chest: Effort normal and breath sounds normal. No respiratory distress. She has no wheezes. She has no rales.  Musculoskeletal:  No upper extremity muscle atrophy Patient has good range of motion with flexion, extension, lateral bending, and rotation to either side but does have some pain with rotation to the right side at base of neck  Neurological:  Symmetric reflexes upper extremities. Normal sensory function to touch. Full strength throughout upper extremities  Assessment:     Right cervical radiculitis symptoms of 2 week duration. Nonfocal exam neurologically. She describes some numbness in the right hand but has no evidence for weakness or loss of reflexes    Plan:     -Recommended cautious trial of prednisone taper over the next week. Reviewed potential side effects -Follow-up with Dr. Maudie Mercury her primary in 2 weeks if unimproved and sooner for any weakness or progressive numbness or progressive pain  Eulas Post MD [facility]

## 2017-03-05 NOTE — Telephone Encounter (Addendum)
Spoke with pt, she would like a refill of Nexium sent to the [facility] pharmacy. I called the [facility] and they stated I would have to fax the prescription to the pt's PCP at the [place], with the last OV to explain why pt is taking the medication. Her doctor is Dr. Milas Hock Fax [phone]. I faxed the this information to her PCP at the [facility] and called pt to let her know (per request). I left message for her to call back.   Patient Instructions by Collene Gobble, MD at 03/02/2017 10:30 AM   Author: Collene Gobble, MD Author Type: Physician Filed: 03/02/2017 11:19 AM  Note Status: Signed Cosign: Cosign Not Required Encounter Date: 03/02/2017  Editor: Collene Gobble, MD (Physician)    Please continue Allegra 180 mg once a day. Increase Flonase to 2 sprays each nostril twice a day until our next visit. Please start Nexium 40 mg once a day until next visit.  Take this medication 1 hour before or 1 hour after eating. Try hard to avoid throat clearing.  Use a menthol free cough drop or a sugar-free candy to help with this We will perform pulmonary function testing at your next visit. Depending on your cough improvement and your other evaluation we may decide to do other testing such as a repeat CT scan of your chest. Follow with Dr Lamonte Sakai in 1 month or next available

## 2017-03-05 NOTE — Patient Instructions (Signed)
Cervical Radiculopathy Cervical radiculopathy happens when a nerve in the neck (cervical nerve) is pinched or bruised. This condition can develop because of an injury or as part of the normal aging process. Pressure on the cervical nerves can cause pain or numbness that runs from the neck all the way down into the arm and fingers. Usually, this condition gets better with rest. Treatment may be needed if the condition does not improve. What are the causes? This condition may be caused by:  Injury.  Slipped (herniated) disk.  Muscle tightness in the neck because of overuse.  Arthritis.  Breakdown or degeneration in the bones and joints of the spine (spondylosis) due to aging.  Bone spurs that may develop near the cervical nerves.  What are the signs or symptoms? Symptoms of this condition include:  Pain that runs from the neck to the arm and hand. The pain can be severe or irritating. It may be worse when the neck is moved.  Numbness or weakness in the affected arm and hand.  How is this diagnosed? This condition may be diagnosed based on symptoms, medical history, and a physical exam. You may also have tests, including:  X-rays.  CT scan.  MRI.  Electromyogram (EMG).  Nerve conduction tests.  How is this treated? In many cases, treatment is not needed for this condition. With rest, the condition usually gets better over time. If treatment is needed, options may include:  Wearing a soft neck collar for short periods of time.  Physical therapy to strengthen your neck muscles.  Medicines, such as NSAIDs, oral corticosteroids, or spinal injections.  Surgery. This may be needed if other treatments do not help. Various types of surgery may be done depending on the cause of your problems.  Follow these instructions at home: Managing pain  Take over-the-counter and prescription medicines only as told by your health care provider.  If directed, apply ice to the affected  area. ? Put ice in a plastic bag. ? Place a towel between your skin and the bag. ? Leave the ice on for 20 minutes, 2-3 times per day.  If ice does not help, you can try using heat. Take a warm shower or warm bath, or use a heat pack as told by your health care provider.  Try a gentle neck and shoulder massage to help relieve symptoms. Activity  Rest as needed. Follow instructions from your health care provider about any restrictions on activities.  Do stretching and strengthening exercises as told by your health care provider or physical therapist. General instructions  If you were given a soft collar, wear it as told by your health care provider.  Use a flat pillow when you sleep.  Keep all follow-up visits as told by your health care provider. This is important. Contact a health care provider if:  Your condition does not improve with treatment. Get help right away if:  Your pain gets much worse and cannot be controlled with medicines.  You have weakness or numbness in your hand, arm, face, or leg.  You have a high fever.  You have a stiff, rigid neck.  You lose control of your bowels or your bladder (have incontinence).  You have trouble with walking, balance, or speaking. This information is not intended to replace advice given to you by your health care provider. Make sure you discuss any questions you have with your health care provider. Document Released: 10/11/2000 Document Revised: 06/24/2015 Document Reviewed: 03/12/2014 Elsevier Interactive Patient Education    2018 Elsevier Inc.  

## 2017-03-06 NOTE — Telephone Encounter (Signed)
I called the [facility] and was unable to reach Norene. Will attempt to call at a later time.

## 2017-03-08 ENCOUNTER — Telehealth: Payer: Self-pay | Admitting: Family Medicine

## 2017-03-08 MED ORDER — VENLAFAXINE HCL 75 MG PO TABS: ORAL_TABLET | ORAL | 1 refills | Status: DC

## 2017-03-08 NOTE — Telephone Encounter (Signed)
Call to [place] to let her know that Rx was sent to [facility] at 11:00 and should be ready for pick up.

## 2017-03-08 NOTE — Telephone Encounter (Unsigned)
Copied from [place] #[id number]. Topic: Quick Communication - Rx Refill/Question >> Mar 08, 2017  9:03 AM Christine Maldonado wrote: Medication: venlafaxine (EFFEXOR) 37.5 MG tablet 3 pills by mouth in morning Per [place] pt dose was reduced requested by PCP. [facility] will [facility] tomorrow. They mailed her an RX for the remainder but the mail order sent the wrong dose. [place] is trying to get [facility] to [facility] but is requesting a 30 day supply of the correct dosing be sent in for pt by PCP to local pharmacy.  Preferred Pharmacy (with phone number or street name): Christine Maldonado, [place] [phone] (Phone) [phone] (Fax)   >> Mar 08, 2017  2:16 PM Christine Maldonado wrote: Christine Maldonado [phone] called back to check if the pt's emergancy medication  has been called in or the status.  She needs this asap for pt to be ready to take Rx in the morning.

## 2017-03-08 NOTE — Telephone Encounter (Signed)
Copied from [place] #[id number]. Topic: Quick Communication - Rx Refill/Question >> Mar 08, 2017  9:03 AM Margot Ables wrote: Medication: venlafaxine (EFFEXOR) 37.5 MG tablet 3 pills by mouth in morning Per [place] pt dose was reduced requested by PCP. [facility] which pt will be out of tomorrow. They mailed her an RX for the remainder but the mail order sent the wrong dose. [place] is trying to get [facility] to [facility] but is requesting a 30 day supply of the correct dosing be sent in for pt by PCP to local pharmacy.  Preferred Pharmacy (with phone number or street name): [facility], [place] [phone] (Phone) [phone] (Fax)

## 2017-03-08 NOTE — Telephone Encounter (Signed)
Rx sent to [facility] for 30-day supply per notes by Dr Maudie Mercury in 02/15/2017 lab test result.

## 2017-03-09 NOTE — Telephone Encounter (Signed)
See phone note from 03/08/17.

## 2017-03-12 ENCOUNTER — Ambulatory Visit (INDEPENDENT_AMBULATORY_CARE_PROVIDER_SITE_OTHER): Payer: Medicare HMO | Admitting: Obstetrics & Gynecology

## 2017-03-12 ENCOUNTER — Ambulatory Visit: Payer: Medicare HMO | Admitting: Nurse Practitioner

## 2017-03-12 ENCOUNTER — Other Ambulatory Visit: Payer: Self-pay

## 2017-03-12 ENCOUNTER — Encounter: Payer: Self-pay | Admitting: Obstetrics & Gynecology

## 2017-03-12 VITALS — BP 126/86 | HR 80 | Resp 18 | Ht 66.0 in | Wt 198.0 lb

## 2017-03-12 DIAGNOSIS — Z01411 Encounter for gynecological examination (general) (routine) with abnormal findings: Secondary | ICD-10-CM

## 2017-03-12 NOTE — Progress Notes (Signed)
83 y.o. G2P2002 Widowed Caucasian F here for annual exam.  Had pneumonia in January.  Just felt so badly and she didn't feel like eating at times.  Stopped taking medication and blood pressure really went up.  Stopped her anti-depressant as well.  She just didn't feel like taking her medication.  Felt like she had some withdrawal symptoms.    Having right shoulder pain, back of right arm hurts and had numbness in her right hand.  If turns head to the right, can make entire arm fall asleep.  Declines referral.    Denies vaginal bleeding.    Patient's last menstrual period was 01/30/1993.          Sexually active: No.  The current method of family planning is post menopausal status.    Exercising: No.  The patient does not participate in regular exercise at present. Smoker:  Former smoker   Health Maintenance: Pap:  11/11/12 negative  History of abnormal Pap:  no MMG:  10/23/16 BIRADS 1 negative  Colonoscopy:  05/08/12 hemorrhoids BMD:   05/31/14  TDaP:  2013 Pneumonia vaccine(s):  04/17/13, 05/18/14 Shingrix:  Has done the Zostavax Hep C testing: not indicated  Screening Labs: PCP, Hb today: PCP, Urine today: not collected   reports that she quit smoking about 51 years ago. She has a 15.00 pack-year smoking history. she has never used smokeless tobacco. She reports that she does not drink alcohol or use drugs.  Past Medical History:  Diagnosis Date  . Anemia   . Bleeding 1993   BTB on HRT-endo biopsy proe with focal breakdown  . Bleeding 1995   BTB on HRT-benign endo biopsy  . Breast cyst 12/95   right breast  . Colon polyps   . Depression   . History of breast cancer 2008   DCIS/invasive ductal cancer right breast ER+/PR+  . Hot flashes 02/09/2014  . Hyperglycemia 02/09/2014   Prediabetes labs 03/2013 at [facility]   . Hypertension   . Migraine   . Osteopenia   . Piriformis muscle pain   . Plantar fasciitis   . PUD (peptic ulcer disease) 8/94   Dr Magod-endoscopy  . Seasonal allergies    . Squamous cell carcinoma 6/11 7/11   on leg-removed  . Ulcer   . UTI (urinary tract infection)     Past Surgical History:  Procedure Laterality Date  . BREAST EXCISIONAL BIOPSY Right 2008   benign  . BREAST LUMPECTOMY Right 06/06/2006   right inner quadrant - invasive with sentinel node biopsy, ER/ PR +;  Her2 Nu negtive  . DILATION AND CURETTAGE OF UTERUS  50+ years ago   for retained placenta  . DILATION AND CURETTAGE OF UTERUS  1986  . KNEE ARTHROSCOPY  2016   Dr. Novella Olive    Current Outpatient Medications  Medication Sig Dispense Refill  . amLODipine (NORVASC) 5 MG tablet Take 1 tablet (5 mg total) by mouth daily. 30 tablet 3  . Ascorbic Acid (VITAMIN C PO) Take 1,000 mg by mouth daily.     Marland Kitchen CALCIUM PO Take 600 mg by mouth daily.     . Cholecalciferol 1000 units capsule Take 2,000 Units by mouth daily.    . Coenzyme Q10 (CO Q 10 PO) Take by mouth daily.    Marland Kitchen CRANBERRY PO Take 84 mg by mouth daily.     Marland Kitchen docusate sodium (COLACE) 100 MG capsule Take 200 mg by mouth at bedtime.     Marland Kitchen esomeprazole (NEXIUM) 40  MG capsule Take 1 capsule (40 mg total) by mouth daily. 30 capsule 2  . fexofenadine (ALLEGRA) 180 MG tablet Take 180 mg by mouth daily.    . fluticasone (FLONASE) 50 MCG/ACT nasal spray     . Garlic 7672 MG CAPS Take 1,000 mg by mouth daily.    . Multiple Vitamin (MULTIVITAMIN) capsule Take 1 capsule by mouth daily.      . NON FORMULARY Tumeric    . Omega-3 Fatty Acids (FISH OIL) 1200 MG CAPS Take 1 capsule by mouth daily.    . Probiotic Product (PROBIOTIC DAILY PO) Take by mouth daily.    Marland Kitchen venlafaxine (EFFEXOR) 75 MG tablet Take 1.5 tablets by mouth once a day 45 tablet 1   No current facility-administered medications for this visit.     Family History  Problem Relation Age of Onset  . Ovarian cancer Mother   . Hypertension Mother   . Arthritis Father   . Cancer Father   . Heart attack Brother   . Diabetes Other        granddaughter  . Kidney failure Sister      ROS:  Pertinent items are noted in HPI.  Otherwise, a comprehensive ROS was negative.  Exam:   BP 126/86 (BP Location: Left Arm, Patient Position: Sitting, Cuff Size: Large)   Pulse 80   Resp 18   Ht '5\' 6"'$  (1.676 m)   Wt 198 lb (89.8 kg)   LMP 01/30/1993   BMI 31.96 kg/m   Weight change: +2#   Height: '5\' 6"'$  (167.6 cm)  Ht Readings from Last 3 Encounters:  03/12/17 '5\' 6"'$  (1.676 m)  03/02/17 '5\' 6"'$  (1.676 m)  02/13/17 '5\' 6"'$  (1.676 m)    General appearance: alert, cooperative and appears stated age Head: Normocephalic, without obvious abnormality, atraumatic Neck: no adenopathy, supple, symmetrical, trachea midline and thyroid normal to inspection and palpation Lungs: clear to auscultation bilaterally Breasts: normal appearance, no masses or tenderness Heart: regular rate and rhythm Abdomen: soft, non-tender; bowel sounds normal; no masses,  no organomegaly Extremities: extremities normal, atraumatic, no cyanosis or edema Skin: Skin color, texture, turgor normal. No rashes or lesions Lymph nodes: Cervical, supraclavicular, and axillary nodes normal. No abnormal inguinal nodes palpated Neurologic: Grossly normal   Pelvic: External genitalia:  no lesions              Urethra:  normal appearing urethra with no masses, tenderness or lesions              Bartholins and Skenes: normal                 Vagina: normal appearing vagina with normal color and discharge, no lesions              Cervix: no lesions              Pap taken: No. Bimanual Exam:  Uterus:  normal size, contour, position, consistency, mobility, non-tender              Adnexa: normal adnexa and no mass, fullness, tenderness               Rectovaginal: Confirms               Anus:  normal sphincter tone, no lesions  Chaperone was present for exam.  A:  Well Woman with normal exam PMP, no HRT H/O right breast acnacer 5/08, Er+/PR+, Her-2 -.  S/p lumpectomy and SNB. H/O SCC on face.  Sees dermatology every  6-12 months Osteopenia Recent URI with significant decline in functioning, improved moderately.  Has pulmonology follow up scheduled. H/O depression  P:   Mammogram guidelines reviewed. pap smear not indicated colonoscopy screening no longer required. Declines shingrix vaccination Lab work is UTD D/w pt returning in two years or just prn basis.  She does not want to come every year.  Will likely just come if needed.

## 2017-03-29 ENCOUNTER — Ambulatory Visit: Payer: Self-pay | Admitting: *Deleted

## 2017-03-29 ENCOUNTER — Telehealth: Payer: Self-pay | Admitting: Family Medicine

## 2017-03-29 NOTE — Telephone Encounter (Signed)
Per Dr. Maudie Mercury advise pt to increase dose on Amlodipine 5 mg take 1 1/2 tablet everyday and schedule a office visit on Monday 04/02/2017. I spoke with pt and gave advice and she is scheduled to see Dr. Maudie Mercury.

## 2017-03-29 NOTE — Telephone Encounter (Signed)
'  Christine Maldonado'  RN from [facility] reports pt's B/P "gradually creeping up since medication changes."  Last Wednesday B/P 168/98... last night 189/98. RN reports prior to that, 160/90. RN was not with pt presently to check B/P this AM. NT called patient. Pt denies any headache,SOB, CP; no speech difficulties or weakness. Pt states B/P had been in range of 120/80 "prior to med changes."  No availability in practice today. Spoke with [employer]. Note routed to practice. Care advise given per protocol. Reason for Disposition . Systolic BP  >= 563 OR Diastolic >= 893  Answer Assessment - Initial Assessment Questions 1. BLOOD PRESSURE: "What is the blood pressure?" "Did you take at least two measurements 5 minutes apart?"     189/98 HS 2. ONSET: "When did you take your blood pressure?"    RN took 3. HOW: "How did you obtain the blood pressure?" (e.g., visiting nurse, automatic home BP monitor)     Visiting nurse 4. HISTORY: "Do you have a history of high blood pressure?"     Yes 5. MEDICATIONS: "Are you taking any medications for blood pressure?" "Have you missed any doses recently?"     Yes; no missed doses 6. OTHER SYMPTOMS: "Do you have any symptoms?" (e.g., headache, chest pain, blurred vision, difficulty breathing, weakness)     None  Protocols used: HIGH BLOOD PRESSURE-A-AH

## 2017-03-29 NOTE — Telephone Encounter (Signed)
Advised patient to continue monitoring B/P and bring in home monitor for office visit on 04/02/2017, patient agreed.

## 2017-03-29 NOTE — Telephone Encounter (Signed)
Updated medication list, see previous triage phone message.

## 2017-04-01 NOTE — Progress Notes (Addendum)
HPI:  AWV 05/10/16 Acute visit for HTN: -meds: amlodipine - upped to 7.'5mg'$  a few days ago -DCd hctz 2ndary to low sodium (now normal) -Reports she is feeling much better -she was sick in December and January with respiratory illnesses and did not take her medicines on and off -She feels much better since increasing the dose of amlodipine -Denies chest pain, shortness of breath, headaches  After the visit, patient asked to come back to the room to talk to me about some other issues.  Thus, this addendum: -Right arm and neck discomfort: -She gets some pain in the right side of her neck at times, this is usually with turning her head a certain way -Occasionally symptoms radiate down the lateral aspect of her right arm -She also gets some tingling in the thenar eminence at times, usually brief -She saw Dr. Elease Hashimoto for this about a month ago  -She reports this does not bother her much in terms of the symptoms and she has not had any imaging for it, but she does worry about a pinched nerve She will be seeing a neurologist about her memory-issues later this month.  Her son will be going with her to that appointment.  ROS: See pertinent positives and negatives per HPI.  Past Medical History:  Diagnosis Date  . Anemia   . Bleeding 1993   BTB on HRT-endo biopsy proe with focal breakdown  . Bleeding 1995   BTB on HRT-benign endo biopsy  . Breast cyst 12/95   right breast  . Colon polyps   . Depression   . History of breast cancer 2008   DCIS/invasive ductal cancer right breast ER+/PR+  . Hot flashes 02/09/2014  . Hyperglycemia 02/09/2014   Pred[facility]abetes labs 03/2013 at [facility]   . Hypertension   . Migraine   . Osteopenia   . Piriformis muscle pain   . Plantar fasciitis   . PUD (peptic ulcer disease) 8/94   Dr Magod-endoscopy  . Seasonal allergies   . Squamous cell carcinoma 6/11 7/11   on leg-removed  . Ulcer   . UTI (urinary tract infection)     Past Surgical History:  Procedure  Laterality Date  . BREAST EXCISIONAL BIOPSY Right 2008   benign  . BREAST LUMPECTOMY Right 06/06/2006   right inner quadrant - invasive with sentinel node biopsy, ER/ PR +;  Her2 Nu negtive  . DILATION AND CURETTAGE OF UTERUS  50+ years ago   for retained placenta  . DILATION AND CURETTAGE OF UTERUS  1986  . KNEE ARTHROSCOPY  2016   Dr. Novella Olive    Family History  Problem Relation Age of Onset  . Ovarian cancer Mother   . Hypertension Mother   . Arthritis Father   . Cancer Father   . Heart attack Brother   . Diabetes Other        granddaughter  . Kidney failure Sister     Social History   Socioeconomic History  . Marital status: Widowed    Spouse name: None  . Number of children: None  . Years of education: None  . Highest education level: None  Social Needs  . Financial resource strain: None  . Food insecurity - worry: None  . Food insecurity - inability: None  . Transportation needs - medical: None  . Transportation needs - non-medical: None  Occupational History  . None  Tobacco Use  . Smoking status: Former Smoker    Packs/day: 1.00    Years: 15.00  Pack years: 15.00    Last attempt to quit: 06/27/1965    Years since quitting: 51.8  . Smokeless tobacco: Never Used  . Tobacco comment: smoked in the [employer]; 15 year approx  Substance and Sexual Activity  . Alcohol use: No  . Drug use: No  . Sexual activity: No    Birth control/protection: Post-menopausal  Other Topics Concern  . None  Social History Narrative   Tobacco use, amount per day now: 0      Past tobacco use, amount per day: Carton      How many years did you use tobacco: [place]      Alcohol use (drinks per week): 0      Diet:      Do you drink/eat things with caffeine? Yes      Marital status:             What year were you married?      Do you live in a house, apartment, assisted living, condo, trailer? Apartment       Is it one or more stories? Yes       How many persons  live in your home?      Do you have any pets in your home? 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  Yes(walking)             How often?      Do you have a living will? Yes      Do you have a DNR form?  Yes          If not, do you want to discuss one?      Do you have signed POA/HPOA forms?   Yes                         Current Outpatient Medications:  .  amLODipine (NORVASC) 5 MG tablet, Take 1.5 tablets (7.5 mg total) by mouth daily., Disp: 135 tablet, Rfl: 3 .  Ascorbic Acid (VITAMIN C PO), Take 1,000 mg by mouth daily. , Disp: , Rfl:  .  CALCIUM PO, Take 600 mg by mouth daily. , Disp: , Rfl:  .  Cholecalciferol 1000 units capsule, Take 2,000 Units by mouth daily., Disp: , Rfl:  .  Coenzyme Q10 (CO Q 10 PO), Take by mouth daily., Disp: , Rfl:  .  CRANBERRY PO, Take 84 mg by mouth daily. , Disp: , Rfl:  .  docusate sodium (COLACE) 100 MG capsule, Take 200 mg by mouth at bedtime. , Disp: , Rfl:  .  esomeprazole (NEXIUM) 40 MG capsule, Take 1 capsule (40 mg total) by mouth daily., Disp: 30 capsule, Rfl: 2 .  fexofenadine (ALLEGRA) 180 MG tablet, Take 180 mg by mouth daily., Disp: , Rfl:  .  fluticasone (FLONASE) 50 MCG/ACT nasal spray, , Disp: , Rfl:  .  Garlic 6962 MG CAPS, Take 1,000 mg by mouth daily., Disp: , Rfl:  .  Multiple Vitamin (MULTIVITAMIN) capsule, Take 1 capsule by mouth daily.  , Disp: , Rfl:  .  NON FORMULARY, Tumeric, Disp: , Rfl:  .  Omega-3 Fatty Acids (FISH OIL) 1200 MG CAPS, Take 1 capsule by mouth daily., Disp: , Rfl:  .  Probiotic Product (PROBIOTIC DAILY PO), Take by mouth daily., Disp: , Rfl:  .  venlafaxine (EFFEXOR) 75 MG tablet, Take 1.5 tablets by mouth once  a day, Disp: 45 tablet, Rfl: 1  EXAM:  Vitals:   04/02/17 1027  BP: 124/72  Pulse: 90  Temp: 98.1 F (36.7 C)    Body mass index is 32.17 kg/m.  GENERAL: vitals reviewed and listed above, alert, oriented, appears well hydrated and in no acute  distress  HEENT: atraumatic, conjunttiva clear, no obvious abnormalities on inspection of external nose and ears  NECK: no obvious masses on inspection  LUNGS: clear to auscultation bilaterally, no wheezes, rales or rhonchi, good air movement  CV: HRRR, no peripheral edema  MS: moves all extremities without noticeable abnormality, fairly normal inspection of the head and neck with head forward posture.  She has decent range of motion in extension/flexion/rotation of the head and neck.  Rotation to the right with extension does reproduce some of her symptoms.  Strength, sensitivity to light touch and DTRs are normal throughout in the upper extremities.  Normal radial pulses.  PSYCH: pleasant and cooperative, no obvious depression or anxiety  ASSESSMENT AND PLAN:  Discussed the following assessment and plan:  Essential hypertension  Neck pain - Plan: DG Cervical Spine Complete  Pain of right upper extremity - Plan: DG Cervical Spine Complete  Paresthesia - Plan: DG Cervical Spine Complete  -I am glad she is doing better -Blood pressure looks much better today and she has decided to continue the current dose and work on a healthy lifestyle -She will be due for annual wellness visit soon, opted to do this at her next visit and will get labs then -Patient advised to return or notify a doctor immediately if symptoms worsen or persist or new concerns arise. Addendum: For the neck and arm issues and paresthesias, we discussed potential etiologies with cervical radiculopathy, possible carpal tunnel syndrome, versus other or combination of these.  We will go ahead and start with plain films and may be some physical therapy.  She prefers conservative treatment options.  Depending on findings he may need further imaging or referral.  She will be seeing a neurologist soon as well.  Patient Instructions  BEFORE YOU LEAVE: -follow up: AWV with Manuela Schwartz and follow up with Dr. Maudie Mercury in 3 about months -  we will do labs then  Continue the Norvasc 7.5 mg daily   We recommend the following healthy lifestyle for LIFE: 1) Small portions. But, make sure to get regular (at least 3 per day), healthy meals and small healthy snacks if needed.  2) Eat a healthy clean diet.   TRY TO EAT: -at least 5-7 servings of low sugar, colorful, and nutrient rich vegetables per day (not corn, potatoes or bananas.) -berries are the best choice if you wish to eat fruit (only eat small amounts if trying to reduce weight)  -lean meets (fish, white meat of chicken or [place]) -vegan proteins for some meals - beans or tofu, whole grains, nuts and seeds -Replace bad fats with good fats - good fats include: fish, nuts and seeds, canola oil, olive oil -small amounts of low fat or non fat dairy -small amounts of100 % whole grains - check the lables -drink plenty of water  AVOID: -SUGAR, sweets, anything with added sugar, corn syrup or sweeteners - must read labels as even foods advertised as "healthy" often are loaded with sugar -if you must have a sweetener, small amounts of stevia may be best -sweetened beverages and artificially sweetened beverages -simple starches (rice, bread, potatoes, pasta, chips, etc - small amounts of 100% whole grains are ok) -  red meat, pork, butter -fried foods, fast food, processed food, excessive dairy, eggs and coconut.  3)Get at least 150 minutes of sweaty aerobic exercise per week.  4)Reduce stress - consider counseling, meditation and relaxation to balance other aspects of your life.     Lucretia Kern, DO

## 2017-04-02 ENCOUNTER — Ambulatory Visit (INDEPENDENT_AMBULATORY_CARE_PROVIDER_SITE_OTHER): Payer: Medicare HMO | Admitting: Family Medicine

## 2017-04-02 ENCOUNTER — Encounter: Payer: Self-pay | Admitting: Family Medicine

## 2017-04-02 VITALS — BP 124/72 | HR 90 | Temp 98.1°F | Ht 66.0 in | Wt 199.3 lb

## 2017-04-02 DIAGNOSIS — M79601 Pain in right arm: Secondary | ICD-10-CM | POA: Diagnosis not present

## 2017-04-02 DIAGNOSIS — R202 Paresthesia of skin: Secondary | ICD-10-CM

## 2017-04-02 DIAGNOSIS — I10 Essential (primary) hypertension: Secondary | ICD-10-CM

## 2017-04-02 DIAGNOSIS — M542 Cervicalgia: Secondary | ICD-10-CM | POA: Diagnosis not present

## 2017-04-02 MED ORDER — AMLODIPINE BESYLATE 5 MG PO TABS: 7.5000 mg | ORAL_TABLET | Freq: Every day | ORAL | 3 refills | Status: DC

## 2017-04-02 NOTE — Addendum Note (Signed)
Addended by: Lucretia Kern on: 04/02/2017 11:10 AM   Modules accepted: Orders, Level of Service

## 2017-04-02 NOTE — Patient Instructions (Addendum)
BEFORE YOU LEAVE: -follow up: AWV with Manuela Schwartz and follow up with Dr. Maudie Mercury in 3 about months - we will do labs then  Continue the Norvasc 7.5 mg daily   We recommend the following healthy lifestyle for LIFE: 1) Small portions. But, make sure to get regular (at least 3 per day), healthy meals and small healthy snacks if needed.  2) Eat a healthy clean diet.   TRY TO EAT: -at least 5-7 servings of low sugar, colorful, and nutrient rich vegetables per day (not corn, potatoes or bananas.) -berries are the best choice if you wish to eat fruit (only eat small amounts if trying to reduce weight)  -lean meets (fish, white meat of chicken or [place]) -vegan proteins for some meals - beans or tofu, whole grains, nuts and seeds -Replace bad fats with good fats - good fats include: fish, nuts and seeds, canola oil, olive oil -small amounts of low fat or non fat dairy -small amounts of100 % whole grains - check the lables -drink plenty of water  AVOID: -SUGAR, sweets, anything with added sugar, corn syrup or sweeteners - must read labels as even foods advertised as "healthy" often are loaded with sugar -if you must have a sweetener, small amounts of stevia may be best -sweetened beverages and artificially sweetened beverages -simple starches (rice, bread, potatoes, pasta, chips, etc - small amounts of 100% whole grains are ok) -red meat, pork, butter -fried foods, fast food, processed food, excessive dairy, eggs and coconut.  3)Get at least 150 minutes of sweaty aerobic exercise per week.  4)Reduce stress - consider counseling, meditation and relaxation to balance other aspects of your life.

## 2017-04-03 ENCOUNTER — Ambulatory Visit (INDEPENDENT_AMBULATORY_CARE_PROVIDER_SITE_OTHER)
Admission: RE | Admit: 2017-04-03 | Discharge: 2017-04-03 | Disposition: A | Discharge status: Home / Self Care | Payer: Medicare HMO | Source: Ambulatory Visit | Attending: Family Medicine | Admitting: Family Medicine

## 2017-04-03 ENCOUNTER — Ambulatory Visit: Payer: Medicare HMO | Admitting: Emergency Medicine

## 2017-04-03 ENCOUNTER — Telehealth: Payer: Self-pay

## 2017-04-03 ENCOUNTER — Encounter: Payer: Self-pay | Admitting: Emergency Medicine

## 2017-04-03 DIAGNOSIS — R053 Chronic cough: Secondary | ICD-10-CM

## 2017-04-03 DIAGNOSIS — R05 Cough: Secondary | ICD-10-CM | POA: Diagnosis not present

## 2017-04-03 DIAGNOSIS — J45909 Unspecified asthma, uncomplicated: Secondary | ICD-10-CM | POA: Insufficient documentation

## 2017-04-03 DIAGNOSIS — M542 Cervicalgia: Secondary | ICD-10-CM | POA: Diagnosis not present

## 2017-04-03 DIAGNOSIS — J4520 Mild intermittent asthma, uncomplicated: Secondary | ICD-10-CM

## 2017-04-03 DIAGNOSIS — R202 Paresthesia of skin: Secondary | ICD-10-CM

## 2017-04-03 DIAGNOSIS — M79601 Pain in right arm: Secondary | ICD-10-CM | POA: Diagnosis not present

## 2017-04-03 LAB — PULMONARY FUNCTION TEST
DL/VA % pred: 86 %
DL/VA: 4.45 ml/min/mmHg/L
DLCO cor % pred: 71 %
DLCO cor: 20.31 ml/min/mmHg
DLCO unc % pred: 69 %
DLCO unc: 19.72 ml/min/mmHg
FEF 25-75 Post: 1.76 L/sec
FEF 25-75 Pre: 1.46 L/sec
FEF2575-%Change-Post: 20 %
FEF2575-%Pred-Post: 142 %
FEF2575-%Pred-Pre: 117 %
FEV1-%Change-Post: 3 %
FEV1-%Pred-Post: 91 %
FEV1-%Pred-Pre: 87 %
FEV1-Post: 1.81 L
FEV1-Pre: 1.75 L
FEV1FVC-%Change-Post: 2 %
FEV1FVC-%Pred-Pre: 108 %
FEV6-%Change-Post: 2 %
FEV6-%Pred-Post: 89 %
FEV6-%Pred-Pre: 87 %
FEV6-Post: 2.26 L
FEV6-Pre: 2.21 L
FEV6FVC-%Change-Post: 0 %
FEV6FVC-%Pred-Post: 106 %
FEV6FVC-%Pred-Pre: 105 %
FVC-%Change-Post: 1 %
FVC-%Pred-Post: 84 %
FVC-%Pred-Pre: 82 %
FVC-Post: 2.26 L
FVC-Pre: 2.23 L
Post FEV1/FVC ratio: 80 %
Post FEV6/FVC ratio: 100 %
Pre FEV1/FVC ratio: 78 %
Pre FEV6/FVC Ratio: 99 %
RV % pred: 103 %
RV: 2.77 L
TLC % pred: 92 %
TLC: 5.11 L

## 2017-04-03 NOTE — Assessment & Plan Note (Signed)
She gives a poor history.  Suspect that there are contributions of GERD and allergic rhinitis.  She also had some mild evidence for obstruction on her flow volume loop before to have an albuterol inhaler available although there is no indication to start her on a maintenance bronchodilator.  Unclear whether we need to follow-up her CT scan of the chest.  I will make this decision based on the progression of her cough over time.  Your breathing tests today show some evidence for some possible mild asthma.  This may be impacting your cough. Keep an albuterol inhaler available to use if needed for episodes of cough, shortness of breath, wheezing. Continue Nexium 40 mg daily.  Take this medication about 1 hour before eating. Continue Allegra 180 mg once a day. Continue fluticasone nasal spray 2 sprays each nostril once a day. Depending on how your cough does we may decide to repeat your CT scan of the chest at some point in the future. Follow with Dr Lamonte Sakai in 1 year or sooner if you have any problems.

## 2017-04-03 NOTE — Progress Notes (Signed)
Subjective:    Patient ID: Christine Maldonado, female    DOB: December 28, 1928, 84 y.o.   MRN: [id number]  Cough  Associated symptoms include postnasal drip and wheezing.   84 year old former smoker with a history of breast cancer, hypertension, hyperglycemia, anemia.  She was apparently treated early January for a community-acquired pneumonia in the setting of wheeze, congestion, cough.  Chest x-ray done 02/05/17 did not show any infiltrates. She is here with chronic cough - has been present for over a year. Happens with talking, not present all the time or even every day. She does clear her throat some. She had a recent worsening beginning of January - low appetite, lethargy, felt "out of it", went off of her meds for a week also (HTN, SSRI). She can't recall if her cough changed during this episode - it may have been productive/. She went to urgent care surrounding this episode and was treated for possible CAP > CXR didn't show an infiltrate. She took the abx but not the prednisone. She is on allegra, flonase. Denies GERD, is on fish oil. Her cough appears to be at baseline - a lot of throat clearing. Cough is non-productive.   CT chest 10/09/16 was reviewed by me.  There were no nodules seen, enlarged pulmonary artery spiculated possible pulmonary arterial hypertension.  There was an area of clustered micro-nodularity in the right upper and right lower lobes, question effects of prior inflammation.   ROV 04/03/17 --this is a follow-up visit for evaluation of chronic cough, possible obstructive lung disease, micronodular disease on CT scan of the chest of unclear significance.  She follows up today after pulmonary function testing that I have reviewed.  This shows grossly normal spirometry with a bronchodilator improvement in the FEF 25-75%, normal lung volumes, decreased diffusion capacity that corrects to normal range when adjusted for alveolar volume.  Her flow volume loop has a concave shape consistent with  obstruction. She reports some improvement since last time - coughs daily, but maybe less than last time. She has trouble remembering her meds > I asked her to start Nexium, she believes that she has done. She didn't increase her flonase to bid, remains on allegra. She continues to clear her throat. Sometimes coughs when trying to swallow. She has albuterol but does not use it.    Review of Systems  HENT: Positive for postnasal drip.   Respiratory: Positive for cough and wheezing.   Allergic/Immunologic: Negative.   Hematological: Bruises/bleeds easily.   Past Medical History:  Diagnosis Date  . Anemia   . Bleeding 1993   BTB on HRT-endo biopsy proe with focal breakdown  . Bleeding 1995   BTB on HRT-benign endo biopsy  . Breast cyst 12/95   right breast  . Colon polyps   . Depression   . History of breast cancer 2008   DCIS/invasive ductal cancer right breast ER+/PR+  . Hot flashes 02/09/2014  . Hyperglycemia 02/09/2014   Prediabetes labs 03/2013 at [facility]   . Hypertension   . Migraine   . Osteopenia   . Piriformis muscle pain   . Plantar fasciitis   . PUD (peptic ulcer disease) 8/94   Dr Magod-endoscopy  . Seasonal allergies   . Squamous cell carcinoma 6/11 7/11   on leg-removed  . Ulcer   . UTI (urinary tract infection)      Family History  Problem Relation Age of Onset  . Ovarian cancer Mother   . Hypertension Mother   .  Arthritis Father   . Cancer Father   . Heart attack Brother   . Diabetes Other        granddaughter  . Kidney failure Sister      Social History   Socioeconomic History  . Marital status: Widowed    Spouse name: Not on file  . Number of children: Not on file  . Years of education: Not on file  . Highest education level: Not on file  Social Needs  . Financial resource strain: Not on file  . Food insecurity - worry: Not on file  . Food insecurity - inability: Not on file  . Transportation needs - medical: Not on file  . Transportation needs -  non-medical: Not on file  Occupational History  . Not on file  Tobacco Use  . Smoking status: Former Smoker    Packs/day: 1.00    Years: 15.00    Pack years: 15.00    Last attempt to quit: 06/27/1965    Years since quitting: 51.8  . Smokeless tobacco: Never Used  . Tobacco comment: smoked in the [employer]; 15 year approx  Substance and Sexual Activity  . Alcohol use: No  . Drug use: No  . Sexual activity: No    Birth control/protection: Post-menopausal  Other Topics Concern  . Not on file  Social History Narrative   Tobacco use, amount per day now: 0      Past tobacco use, amount per day: Carton      How many years did you use tobacco: [place]      Alcohol use (drinks per week): 0      Diet:      Do you drink/eat things with caffeine? Yes      Marital status:             What year were you married?      Do you live in a house, apartment, assisted living, condo, trailer? Apartment       Is it one or more stories? Yes       How many persons live in your home?      Do you have any pets in your home? 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  Yes(walking)             How often?      Do you have a living will? Yes      Do you have a DNR form?  Yes          If not, do you want to discuss one?      Do you have signed POA/HPOA forms?   Yes                         Allergies  Allergen Reactions  . Amoxicillin     GI upset  . Asa [Aspirin]     Had ulcer/ told not to take  . Erythrocin   . Diflucan [Fluconazole] Rash  . Nitrofuran Derivatives Rash     Outpatient Medications Prior to Visit  Medication Sig Dispense Refill  . amLODipine (NORVASC) 5 MG tablet Take 1.5 tablets (7.5 mg total) by mouth daily. 135 tablet 3  . Ascorbic Acid (VITAMIN C PO) Take 1,000 mg by mouth daily.     Marland Kitchen CALCIUM PO Take 600 mg by mouth daily.     . Cholecalciferol 1000 units capsule  Take 2,000 Units by mouth daily.    . Coenzyme Q10 (CO Q 10 PO) Take  by mouth daily.    Marland Kitchen CRANBERRY PO Take 84 mg by mouth daily.     Marland Kitchen docusate sodium (COLACE) 100 MG capsule Take 200 mg by mouth at bedtime.     Marland Kitchen esomeprazole (NEXIUM) 40 MG capsule Take 1 capsule (40 mg total) by mouth daily. 30 capsule 2  . fexofenadine (ALLEGRA) 180 MG tablet Take 180 mg by mouth daily.    . fluticasone (FLONASE) 50 MCG/ACT nasal spray     . Garlic 6761 MG CAPS Take 1,000 mg by mouth daily.    . Multiple Vitamin (MULTIVITAMIN) capsule Take 1 capsule by mouth daily.      . NON FORMULARY Tumeric    . Omega-3 Fatty Acids (FISH OIL) 1200 MG CAPS Take 1 capsule by mouth daily.    . Probiotic Product (PROBIOTIC DAILY PO) Take by mouth daily.    Marland Kitchen venlafaxine (EFFEXOR) 75 MG tablet Take 1.5 tablets by mouth once a day 45 tablet 1   No facility-administered medications prior to visit.         Objective:   Physical Exam Vitals:   04/03/17 1112 04/03/17 1115  BP:  130/82  Pulse:  90  SpO2:  96%  Weight: 198 lb (89.8 kg)   Height: 5\' 7"  (1.702 m)    Gen: Pleasant, well-nourished, in no distress,  normal affect  ENT: No lesions,  mouth clear,  oropharynx clear, no postnasal drip  Neck: No JVD, no stridor  Lungs: No use of accessory muscles, clear without rales or rhonchi  Cardiovascular: RRR, heart sounds normal, no murmur or gallops, no peripheral edema  Musculoskeletal: No deformities, no cyanosis or clubbing  Neuro: alert, poor memory, poor history giver  Skin: Warm, no lesions or rash      Assessment & Plan:  Chronic cough She gives a poor history.  Suspect that there are contributions of GERD and allergic rhinitis.  She also had some mild evidence for obstruction on her flow volume loop before to have an albuterol inhaler available although there is no indication to start her on a maintenance bronchodilator.  Unclear whether we need to follow-up her CT scan of the chest.  I will make this decision based on the progression of her cough over time.  Your  breathing tests today show some evidence for some possible mild asthma.  This may be impacting your cough. Keep an albuterol inhaler available to use if needed for episodes of cough, shortness of breath, wheezing. Continue Nexium 40 mg daily.  Take this medication about 1 hour before eating. Continue Allegra 180 mg once a day. Continue fluticasone nasal spray 2 sprays each nostril once a day. Depending on how your cough does we may decide to repeat your CT scan of the chest at some point in the future. Follow with Dr Lamonte Sakai in 1 year or sooner if you have any problems.   Mild asthma Based on curvature of her flow volume loop and a change in her FEF 25-75% after bronchodilator.  Discussed using albuterol for symptom management with her today.    Baltazar Apo, MD, PhD 04/03/2017, 11:44 AM [facility] [phone] or if no answer [phone]

## 2017-04-03 NOTE — Telephone Encounter (Signed)
Copied from [place] [id number]. Topic: Inquiry >> Apr 03, 2017  8:18 AM Pricilla Handler wrote: Reason for CRM: Rob Scifres with [facility] [phone]) called rearding a fax request sent to Dr. Maudie Mercury for patient to receive Speech Therapy. Rob states that this request was Faxed to our office on 03/01/2017, but he has received no response from anyone. Rob states that patient needs to start speech therapy ASAP. Rob would like a call back this morning at [phone].       Thank You!!!

## 2017-04-03 NOTE — Assessment & Plan Note (Signed)
Based on curvature of her flow volume loop and a change in her FEF 25-75% after bronchodilator.  Discussed using albuterol for symptom management with her today.

## 2017-04-03 NOTE — Telephone Encounter (Signed)
I called the pt and informed her per Dr Maudie Mercury she received the fax from [facility] for speech therapy today.  She stated the pt and her son declined therapy and she can await the appt with neurology to discuss this if she prefers.  Patient stated she would prefer to see the neurologist first and has an appt at the end of the month.  I called Rob and left a detailed message with this information.

## 2017-04-03 NOTE — Patient Instructions (Addendum)
Your breathing tests today show some evidence for some possible mild asthma.  This may be impacting your cough. Keep an albuterol inhaler available to use if needed for episodes of cough, shortness of breath, wheezing. Continue Nexium 40 mg daily.  Take this medication about 1 hour before eating. Continue Allegra 180 mg once a day. Continue fluticasone nasal spray 2 sprays each nostril once a day. Depending on how your cough does we may decide to repeat your CT scan of the chest at some point in the future. Follow with Dr Lamonte Sakai in 1 year or sooner if you have any problems.

## 2017-04-09 ENCOUNTER — Telehealth: Payer: Self-pay | Admitting: *Deleted

## 2017-04-09 ENCOUNTER — Encounter: Payer: Self-pay | Admitting: Family Medicine

## 2017-04-09 DIAGNOSIS — D485 Neoplasm of uncertain behavior of skin: Secondary | ICD-10-CM | POA: Diagnosis not present

## 2017-04-09 DIAGNOSIS — L570 Actinic keratosis: Secondary | ICD-10-CM | POA: Diagnosis not present

## 2017-04-09 DIAGNOSIS — L821 Other seborrheic keratosis: Secondary | ICD-10-CM | POA: Diagnosis not present

## 2017-04-09 DIAGNOSIS — D225 Melanocytic nevi of trunk: Secondary | ICD-10-CM | POA: Diagnosis not present

## 2017-04-09 DIAGNOSIS — L409 Psoriasis, unspecified: Secondary | ICD-10-CM | POA: Diagnosis not present

## 2017-04-09 DIAGNOSIS — L814 Other melanin hyperpigmentation: Secondary | ICD-10-CM | POA: Diagnosis not present

## 2017-04-09 DIAGNOSIS — Z85828 Personal history of other malignant neoplasm of skin: Secondary | ICD-10-CM | POA: Diagnosis not present

## 2017-04-09 DIAGNOSIS — D1801 Hemangioma of skin and subcutaneous tissue: Secondary | ICD-10-CM | POA: Diagnosis not present

## 2017-04-09 DIAGNOSIS — Z23 Encounter for immunization: Secondary | ICD-10-CM | POA: Diagnosis not present

## 2017-04-09 NOTE — Telephone Encounter (Signed)
  See results note. Christine Maldonado     Copied from [place]. Topic: General - Other >> Apr 09, 2017  8:30 AM Valla Leaver wrote: Reason for CRM: Patient would like a call back from Christine Maldonado who called her 3/08 and requested for the pt to call back. No documentation in the call from 03/08. Patient awaiting a call back and thinks it was relating to her x-ray done 03/05.

## 2017-04-24 DIAGNOSIS — Z872 Personal history of diseases of the skin and subcutaneous tissue: Secondary | ICD-10-CM | POA: Diagnosis not present

## 2017-04-24 DIAGNOSIS — L570 Actinic keratosis: Secondary | ICD-10-CM | POA: Diagnosis not present

## 2017-04-27 ENCOUNTER — Other Ambulatory Visit: Payer: Medicare HMO

## 2017-04-27 ENCOUNTER — Ambulatory Visit: Payer: Medicare HMO | Admitting: Neurology

## 2017-04-27 ENCOUNTER — Encounter: Payer: Self-pay | Admitting: Neurology

## 2017-04-27 ENCOUNTER — Other Ambulatory Visit: Payer: Self-pay

## 2017-04-27 VITALS — BP 150/75 | HR 88 | Ht 66.5 in | Wt 202.0 lb

## 2017-04-27 DIAGNOSIS — M5412 Radiculopathy, cervical region: Secondary | ICD-10-CM | POA: Diagnosis not present

## 2017-04-27 DIAGNOSIS — G3184 Mild cognitive impairment, so stated: Secondary | ICD-10-CM

## 2017-04-27 LAB — VITAMIN B12: VITAMIN B 12: 1575 pg/mL — AB (ref 200–1100)

## 2017-04-27 LAB — TSH: TSH: 2.39 m[IU]/L (ref 0.40–4.50)

## 2017-04-27 NOTE — Progress Notes (Signed)
NEUROLOGY CONSULTATION NOTE  Christine Maldonado MRN: [id number] DOB: Feb 15, 1928  Referring provider: Dr. Colin Benton Primary care provider: Dr. Colin Benton  Reason for consult:  Memory changes, numbness  Dear Dr Maudie Mercury:  Thank you for your kind referral of Christine Maldonado for consultation of the above symptoms. Although her history is well known to you, please allow me to reiterate it for the purpose of our medical record. The patient was accompanied to the clinic by her son, Dr. Lavena Bullion, who also provides collateral information. Records and images were personally reviewed where available.  HISTORY OF PRESENT ILLNESS: This is a very pleasant 84 year old right-handed woman with a history of hypertension, presenting for evaluation of memory changes and right arm numbness. She feels her memory is "not very good." She has difficulties coming up with names, "it's in there, it's trying to come out." Her son started noticing mild memory changes over the past year, but describes an event in January 2019 where she was in a confusional state and stopped eating, drinking, and taking her medications. She recognized it as well and kept saying "I just can't think," and asked to be in higher level of care at [facility] for 3 days, then her son set up [facility] with a nurse coming to give her medications and a caregiver checking in. She went to Urgent Care and was initially told she had a pneumonia, then went to Pulmonary for evaluation. They both report that things have gotten a lot better but she is still not back to baseline. She lives alone in independent living and had missed bill payments in January. Paperwork had come for taxes and she has not looked at them, now she cannot find them. She could not recall the day she joined [employer], which is unusual for her. Her son talks to her regularly and noticed she would not remember conversations. [facility] nurse now comes regularly to fix her pillbox. Before she  got sick, she did get lost driving in unfamiliar roads. No hygiene concerns, she does laundry and dishes herself. She is independent with dressing and bathing. No personality changes, hallucinations, paranoia. There is no family history of dementia. She denies any history of significant head injuries or alcohol use.  She has brief dizziness getting out of bed. She denies any headaches, diplopia, dysarthria/dysphagia, back pain, bowel/bladder dysfunction, anosmia. She has had bilateral hand tremors for 2-3 years. She has also noticed that certain head movements with head turn to the right causes pain in her shoulder, with numbness and tingling radiating down her right arm to the right thumb and index finger. No hand weakness. Her son reports new skin changes and edema in both legs.    PAST MEDICAL HISTORY: Past Medical History:  Diagnosis Date  . Anemia   . Bleeding 1993   BTB on HRT-endo biopsy proe with focal breakdown  . Bleeding 1995   BTB on HRT-benign endo biopsy  . Breast cyst 12/95   right breast  . Colon polyps   . Depression   . History of breast cancer 2008   DCIS/invasive ductal cancer right breast ER+/PR+  . Hot flashes 02/09/2014  . Hyperglycemia 02/09/2014   Prediabetes labs 03/2013 at [facility]   . Hypertension   . Migraine   . Osteopenia   . Piriformis muscle pain   . Plantar fasciitis   . PUD (peptic ulcer disease) 8/94   Dr Magod-endoscopy  . Seasonal allergies   . Squamous  cell carcinoma 6/11 7/11   on leg-removed  . Ulcer   . UTI (urinary tract infection)     PAST SURGICAL HISTORY: Past Surgical History:  Procedure Laterality Date  . BREAST EXCISIONAL BIOPSY Right 2008   benign  . BREAST LUMPECTOMY Right 06/06/2006   right inner quadrant - invasive with sentinel node biopsy, ER/ PR +;  Her2 Nu negtive  . DILATION AND CURETTAGE OF UTERUS  50+ years ago   for retained placenta  . DILATION AND CURETTAGE OF UTERUS  1986  . KNEE ARTHROSCOPY  2016   Dr. Novella Olive     MEDICATIONS: Current Outpatient Medications on File Prior to Visit  Medication Sig Dispense Refill  . amLODipine (NORVASC) 5 MG tablet Take 1.5 tablets (7.5 mg total) by mouth daily. 135 tablet 3  . Ascorbic Acid (VITAMIN C PO) Take 1,000 mg by mouth daily.     Marland Kitchen CALCIUM PO Take 600 mg by mouth daily.     . Cholecalciferol 1000 units capsule Take 2,000 Units by mouth daily.    . Coenzyme Q10 (CO Q 10 PO) Take by mouth daily.    Marland Kitchen CRANBERRY PO Take 84 mg by mouth daily.     Marland Kitchen docusate sodium (COLACE) 100 MG capsule Take 200 mg by mouth at bedtime.     Marland Kitchen esomeprazole (NEXIUM) 40 MG capsule Take 1 capsule (40 mg total) by mouth daily. 30 capsule 2  . fexofenadine (ALLEGRA) 180 MG tablet Take 180 mg by mouth daily.    . fluticasone (FLONASE) 50 MCG/ACT nasal spray     . Garlic 4010 MG CAPS Take 1,000 mg by mouth daily.    . Multiple Vitamin (MULTIVITAMIN) capsule Take 1 capsule by mouth daily.      . NON FORMULARY Tumeric    . Omega-3 Fatty Acids (FISH OIL) 1200 MG CAPS Take 1 capsule by mouth daily.    . Probiotic Product (PROBIOTIC DAILY PO) Take by mouth daily.    Marland Kitchen venlafaxine (EFFEXOR) 75 MG tablet Take 1.5 tablets by mouth once a day 45 tablet 1   No current facility-administered medications on file prior to visit.     ALLERGIES: Allergies  Allergen Reactions  . Amoxicillin     GI upset  . Asa [Aspirin]     Had ulcer/ told not to take  . Erythrocin   . Diflucan [Fluconazole] Rash  . Nitrofuran Derivatives Rash    FAMILY HISTORY: Family History  Problem Relation Age of Onset  . Ovarian cancer Mother   . Hypertension Mother   . Arthritis Father   . Cancer Father   . Heart attack Brother   . Diabetes Other        granddaughter  . Kidney failure Sister     SOCIAL HISTORY: Social History   Socioeconomic History  . Marital status: Widowed    Spouse name: Not on file  . Number of children: Not on file  . Years of education: Not on file  . Highest education  level: Not on file  Occupational History  . Not on file  Social Needs  . Financial resource strain: Not on file  . Food insecurity:    Worry: Not on file    Inability: Not on file  . Transportation needs:    Medical: Not on file    Non-medical: Not on file  Tobacco Use  . Smoking status: Former Smoker    Packs/day: 1.00    Years: 15.00    Pack years:  15.00    Last attempt to quit: 06/27/1965    Years since quitting: 51.8  . Smokeless tobacco: Never Used  . Tobacco comment: smoked in the [employer]; 15 year approx  Substance and Sexual Activity  . Alcohol use: No  . Drug use: No  . Sexual activity: Never    Birth control/protection: Post-menopausal  Lifestyle  . Physical activity:    Days per week: Not on file    Minutes per session: Not on file  . Stress: Not on file  Relationships  . Social connections:    Talks on phone: Not on file    Gets together: Not on file    Attends religious service: Not on file    Active member of club or organization: Not on file    Attends meetings of clubs or organizations: Not on file    Relationship status: Not on file  . Intimate partner violence:    Fear of current or ex partner: Not on file    Emotionally abused: Not on file    Physically abused: Not on file    Forced sexual activity: Not on file  Other Topics Concern  . Not on file  Social History Narrative   Tobacco use, amount per day now: 0      Past tobacco use, amount per day: Carton      How many years did you use tobacco: [place]      Alcohol use (drinks per week): 0      Diet:      Do you drink/eat things with caffeine? Yes      Marital status:             What year were you married?      Do you live in a house, apartment, assisted living, condo, trailer? Apartment       Is it one or more stories? Yes       How many persons live in your home?      Do you have any pets in your home? 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   Yes(walking)             How often?      Do you have a living will? Yes      Do you have a DNR form?  Yes          If not, do you want to discuss one?      Do you have signed POA/HPOA forms?   Yes                        REVIEW OF SYSTEMS: Constitutional: No fevers, chills, or sweats, no generalized fatigue, change in appetite Eyes: No visual changes, double vision, eye pain Ear, nose and throat: No hearing loss, ear pain, nasal congestion, sore throat Cardiovascular: No chest pain, palpitations Respiratory:  No shortness of breath at rest or with exertion, wheezes GastrointestinaI: No nausea, vomiting, diarrhea, abdominal pain, fecal incontinence Genitourinary:  No dysuria, urinary retention or frequency Musculoskeletal:  + neck pain, no back pain Integumentary: No rash, pruritus, skin lesions Neurological: as above Psychiatric: No depression, insomnia, anxiety Endocrine: No palpitations, fatigue, diaphoresis, mood swings, change in appetite, change in weight, increased thirst Hematologic/Lymphatic:  No anemia, purpura, petechiae. Allergic/Immunologic: no itchy/runny eyes, nasal congestion, recent allergic reactions, rashes  PHYSICAL EXAM: Vitals:   04/27/17 1416  BP: (!) 150/75  Pulse: 88  SpO2: 94%   General: No acute distress Head:  Normocephalic/atraumatic Eyes: Fundoscopic exam shows bilateral sharp discs, no vessel changes, exudates, or hemorrhages Neck: supple, no paraspinal tenderness, full range of motion Back: No paraspinal tenderness Heart: regular rate and rhythm Lungs: Clear to auscultation bilaterally. Vascular: No carotid bruits. Skin/Extremities: No rash, no edema Neurological Exam: Mental status: alert and oriented to person, place, and time, no dysarthria or aphasia, Fund of knowledge is appropriate.  Recent and remote memory are intact.  Attention and concentration are normal.    Able to name objects and repeat phrases.  Montreal Cognitive Assessment   04/27/2017  Visuospatial/ Executive (0/5) 3  Naming (0/3) 2  Attention: Read list of digits (0/2) 2  Attention: Read list of letters (0/1) 1  Attention: Serial 7 subtraction starting at 100 (0/3) 3  Language: Repeat phrase (0/2) 2  Language : Fluency (0/1) 1  Abstraction (0/2) 2  Delayed Recall (0/5) 3  Orientation (0/6) 6  Total 25   Cranial nerves: CN I: not tested CN II: pupils equal, round and reactive to light, visual fields intact, fundi unremarkable. CN III, IV, VI:  full range of motion, no nystagmus, no ptosis CN V: facial sensation intact CN VII: upper and lower face symmetric CN VIII: hearing intact to finger rub CN IX, X: gag intact, uvula midline CN XI: sternocleidomastoid and trapezius muscles intact CN XII: tongue midline Bulk & Tone: normal, no cogwheeling, no fasciculations. Motor: 5/5 throughout with no pronator drift. Sensation: intact to light touch, cold, pin, vibration and joint position sense.  No extinction to double simultaneous stimulation.  Romberg test negative Deep Tendon Reflexes: +2 throughout except +1 right bicep and brachioradialis reflex, no ankle clonus Plantar responses: downgoing bilaterally Cerebellar: no incoordination on finger to nose, heel to shin. No dysdiadochokinesia Gait: narrow-based and steady, able to tandem walk adequately. Tremor: +right hand resting tremor, +mild bilateral postural and endpoint tremor Negative pull test, able to rise from chair with arms over chest  IMPRESSION: This is a pleasant 84 year old right-handed woman with a history of hypertension, presenting for evaluation of worsening memory over the past year, significantly worse transiently last January 2019, and still not back to baseline since then. Her neurological exam is largely non-focal, she does have a tremor that appears to be consistent with Essential tremor. Her MOCA score today is 25/30, indicating Mild Cognitive Impairment. We discussed different causes  of memory loss, check TSH and B12. An MRI brain without contrast will be ordered to assess for underlying structural abnormality and assess vascular load. We briefly discussed cholinesterase inhibitors, her son is a neurologist, we have agreed to hold off for now. She is also reporting right arm paresthesias and pain on head turn to the right, concerning for cervical radiculopathy. PT will be ordered. We discussed continued monitoring of medication intake and driving. We discussed the importance of control of vascular risk factors, physical exercise, and brain stimulation exercises for brain health. She will follow-up in 6 months and knows to call for any changes.   Thank you for allowing me to participate in the care of this patient. Please do not hesitate to call for any questions or concerns.   Ellouise Newer, M.D.  CC: Dr. Maudie Mercury

## 2017-04-27 NOTE — Patient Instructions (Addendum)
1. Bloodwork for TSH, B12  Your provider requests that you have LABS drawn today.  We share a lab with [facility] - they are located in [address] (second floor) of this building.  Once you get there, please have a seat and the phlebotomist will call your name.  If you have waited more than 15 minutes, please advise the front desk  2. Schedule MRI brain without contrast  We have sent a referral to [facility] for your MRI and they will call you directly to schedule your appt. They are located at [place]. If you need to contact them directly please call [phone].   3. Refer for PT at [facility] for cervical radiculopathy affecting right arm 4. Continue to monitor medications, driving 5. Follow-up in 6 months, call for any changes  FALL PRECAUTIONS: Be cautious when walking. Scan the area for obstacles that may increase the risk of trips and falls. When getting up in the mornings, sit up at the edge of the bed for a few minutes before getting out of bed. Consider elevating the bed at the head end to avoid drop of blood pressure when getting up. Walk always in a well-lit room (use night lights in the walls). Avoid area rugs or power cords from appliances in the middle of the walkways. Use a walker or a cane if necessary and consider physical therapy for balance exercise. Get your eyesight checked regularly.  FINANCIAL OVERSIGHT: Supervision, especially oversight when making financial decisions or transactions is also recommended.  HOME SAFETY: Consider the safety of the kitchen when operating appliances like stoves, microwave oven, and blender. Consider having supervision and share cooking responsibilities until no longer able to participate in those. Accidents with firearms and other hazards in the house should be identified and addressed as well.  DRIVING: Regarding driving, in patients with progressive memory problems, driving will be impaired. We advise to  have someone else do the driving if trouble finding directions or if minor accidents are reported. Independent driving assessment is available to determine safety of driving.  ABILITY TO BE LEFT ALONE: If patient is unable to contact 911 operator, consider using LifeLine, or when the need is there, arrange for someone to stay with patients. Smoking is a fire hazard, consider supervision or cessation. Risk of wandering should be assessed by caregiver and if detected at any point, supervision and safe proof recommendations should be instituted.  MEDICATION SUPERVISION: Inability to self-administer medication needs to be constantly addressed. Implement a mechanism to ensure safe administration of the medications.  RECOMMENDATIONS FOR ALL PATIENTS WITH MEMORY PROBLEMS: 1. Continue to exercise (Recommend 30 minutes of walking everyday, or 3 hours every week) 2. Increase social interactions - continue going to [place] and enjoy social gatherings with friends and family 3. Eat healthy, avoid fried foods and eat more fruits and vegetables 4. Maintain adequate blood pressure, blood sugar, and blood cholesterol level. Reducing the risk of stroke and cardiovascular disease also helps promoting better memory. 5. Avoid stressful situations. Live a simple life and avoid aggravations. Organize your time and prepare for the next day in anticipation. 6. Sleep well, avoid any interruptions of sleep and avoid any distractions in the bedroom that may interfere with adequate sleep quality 7. Avoid sugar, avoid sweets as there is a strong link between excessive sugar intake, diabetes, and cognitive impairment The Mediterranean diet has been shown to help patients reduce the risk of progressive memory disorders and reduces cardiovascular risk.  This includes eating fish, eat fruits and green leafy vegetables, nuts like almonds and hazelnuts, walnuts, and also use olive oil. Avoid fast foods and fried foods as much as possible.  Avoid sweets and sugar as sugar use has been linked to worsening of memory function.  There is always a concern of gradual progression of memory problems. If this is the case, then we may need to adjust level of care according to patient needs. Support, both to the patient and caregiver, should then be put into place.

## 2017-04-30 ENCOUNTER — Telehealth: Payer: Self-pay

## 2017-04-30 NOTE — Telephone Encounter (Signed)
Spoke with pt relaying message below.   

## 2017-04-30 NOTE — Telephone Encounter (Signed)
-----   Message from Cameron Sprang, MD sent at 04/30/2017 12:57 PM EDT ----- Pls let her know bloodwork is normal, normal thyroid and B12 levels, thanks

## 2017-05-07 ENCOUNTER — Telehealth: Payer: Self-pay | Admitting: Neurology

## 2017-05-07 NOTE — Telephone Encounter (Signed)
Called [facility], spoke with Prentiss Bells. I questioned if she was able to see referral for MRI brain. Miriam to contact Pt to schedule.

## 2017-05-07 NOTE — Telephone Encounter (Signed)
Patient called to check status of the MRI Referral. Thanks

## 2017-05-12 ENCOUNTER — Ambulatory Visit
Admission: RE | Admit: 2017-05-12 | Discharge: 2017-05-12 | Disposition: A | Discharge status: Home / Self Care | Payer: Medicare HMO | Source: Ambulatory Visit | Attending: Neurology | Admitting: Neurology

## 2017-05-12 DIAGNOSIS — R202 Paresthesia of skin: Secondary | ICD-10-CM | POA: Diagnosis not present

## 2017-05-12 DIAGNOSIS — G3184 Mild cognitive impairment, so stated: Secondary | ICD-10-CM

## 2017-05-12 DIAGNOSIS — M5412 Radiculopathy, cervical region: Secondary | ICD-10-CM

## 2017-05-15 ENCOUNTER — Telehealth: Payer: Self-pay | Admitting: Family Medicine

## 2017-05-15 ENCOUNTER — Ambulatory Visit: Payer: Medicare HMO | Admitting: Family Medicine

## 2017-05-15 NOTE — Telephone Encounter (Signed)
I left a detailed message at the pts cell number with the information below. 

## 2017-05-15 NOTE — Telephone Encounter (Signed)
Per notes, low sodium. Normal after stopping.

## 2017-05-15 NOTE — Telephone Encounter (Signed)
The patient is wanting to know why she was taken off of this medication  hydrochlorothiazide (HYDRODIURIL) 25 MG tablet   Please advise

## 2017-05-16 ENCOUNTER — Telehealth: Payer: Self-pay | Admitting: Neurology

## 2017-05-16 ENCOUNTER — Telehealth: Payer: Self-pay

## 2017-05-16 DIAGNOSIS — G3184 Mild cognitive impairment, so stated: Secondary | ICD-10-CM | POA: Insufficient documentation

## 2017-05-16 NOTE — Progress Notes (Signed)
HPI:  Using dictation device. Unfortunately this device frequently misinterprets words/phrases.   Christine Maldonado is a pleasant 84 y.o. here for follow up. Chronic medical problems summarized below were reviewed for changes. Since estopping her dirutec and increasing norvasc she has had some ankle edema. Reports sh eeat well and avoids sodium in the diet. No CP, dizziness, palpitations. Reports mood is ok. Daughter recently visited and she really enjoyed that, felt sad when she left. See PHQ9.   She saw pulmonology about her cough and neurologist about the cognitive dysfunction and neck pain since our last visit (see below). She was supposed to see Manuela Schwartz for her AWV, but appears did not schedule this. She can't remember what the neurologist told her about the neck issues and she can't remember what Dr. Lamonte Sakai told her to take for the GERD. Denies CP, SOB, DOE, treatment intolerance or new symptoms.  Neck pain: -reported 03/2017 -xray with mild degenerative changes, recommend PT and ortho if not improving -she saw neurologis 3/2019t, whom per notes ordered PT as well  HTN: -meds: norvasc 7.5 -hctz DCd 2ndary to low sodium  Prediabetes/obesity/Aortic atherosclerosis: -no medications  Recurrent depression: -meds: effexor  Mild cognitive impairment: -seeing neurology, Dr. Delice Lesch  Chronic cough: -seeing pulmonologist, Dr. Lamonte Sakai -hx tobacco use -mild asthma is Dx per pulmonologist from spirometry 2019 -? GERD, AR, mild asthma and ? Micronodular disease on CT per pulm notes 03/2017 -pulm advised nexium 40, flonase, allegra, prn albuteorl and follow up in 1 year with them with possible repeat of CT  Sees dermatologist for nonmelanoma skin ca, scalp dermatitis. Sees Dr. Sabra Heck, [facility].  ROS: See pertinent positives and negatives per HPI.  Past Medical History:  Diagnosis Date  . Anemia   . Bleeding 1993   BTB on HRT-endo biopsy proe with focal breakdown  . Bleeding  1995   BTB on HRT-benign endo biopsy  . Breast cyst 12/95   right breast  . Colon polyps   . Depression   . History of breast cancer 2008   DCIS/invasive ductal cancer right breast ER+/PR+  . Hot flashes 02/09/2014  . Hyperglycemia 02/09/2014   Predi[facility] labs 03/2013 at [facility]   . Hypertension   . Migraine   . Osteopenia   . Piriformis muscle pain   . Plantar fasciitis   . PUD (peptic ulcer disease) 8/94   Dr Magod-endoscopy  . Seasonal allergies   . Squamous cell carcinoma 6/11 7/11   on leg-removed  . Ulcer   . UTI (urinary tract infection)     Past Surgical History:  Procedure Laterality Date  . BREAST EXCISIONAL BIOPSY Right 2008   benign  . BREAST LUMPECTOMY Right 06/06/2006   right inner quadrant - invasive with sentinel node biopsy, ER/ PR +;  Her2 Nu negtive  . DILATION AND CURETTAGE OF UTERUS  50+ years ago   for retained placenta  . DILATION AND CURETTAGE OF UTERUS  1986  . KNEE ARTHROSCOPY  2016   Dr. Novella Olive    Family History  Problem Relation Age of Onset  . Ovarian cancer Mother   . Hypertension Mother   . Arthritis Father   . Cancer Father   . Heart attack Brother   . Diabetes Other        granddaughter  . Kidney failure Sister     SOCIAL HX: see hpi, chart   Current Outpatient Medications:  .  amLODipine (NORVASC) 5 MG tablet, Take 1 tablet (5 mg total) by  mouth daily., Disp: 90 tablet, Rfl: 3 .  Ascorbic Acid (VITAMIN C PO), Take 1,000 mg by mouth daily. , Disp: , Rfl:  .  CALCIUM PO, Take 600 mg by mouth daily. , Disp: , Rfl:  .  Cholecalciferol 1000 units capsule, Take 2,000 Units by mouth daily., Disp: , Rfl:  .  Coenzyme Q10 (CO Q 10 PO), Take by mouth daily., Disp: , Rfl:  .  CRANBERRY PO, Take 84 mg by mouth daily. , Disp: , Rfl:  .  docusate sodium (COLACE) 100 MG capsule, Take 200 mg by mouth at bedtime. , Disp: , Rfl:  .  fexofenadine (ALLEGRA) 180 MG tablet, Take 180 mg by mouth daily., Disp: , Rfl:  .  Garlic 6578 MG CAPS, Take 1,000  mg by mouth daily., Disp: , Rfl:  .  Multiple Vitamin (MULTIVITAMIN) capsule, Take 1 capsule by mouth daily.  , Disp: , Rfl:  .  NON FORMULARY, Tumeric, Disp: , Rfl:  .  Omega-3 Fatty Acids (FISH OIL) 1200 MG CAPS, Take 1 capsule by mouth daily., Disp: , Rfl:  .  Probiotic Product (PROBIOTIC DAILY PO), Take by mouth daily., Disp: , Rfl:  .  venlafaxine (EFFEXOR) 75 MG tablet, Take 1.5 tablets by mouth once a day, Disp: 45 tablet, Rfl: 1 .  pravastatin (PRAVACHOL) 20 MG tablet, Take 1 tablet (20 mg total) by mouth daily., Disp: 90 tablet, Rfl: 3  EXAM:  Vitals:   05/17/17 1006  BP: (!) 124/58  Pulse: 93  Temp: 98.2 F (36.8 C)    Body mass index is 31.8 kg/m.  GENERAL: vitals reviewed and listed above, alert, oriented, appears well hydrated and in no acute distress  HEENT: atraumatic, conjunttiva clear, no obvious abnormalities on inspection of external nose and ears  NECK: no obvious masses on inspection  LUNGS: clear to auscultation bilaterally, no wheezes, rales or rhonchi, good air movement  CV: HRRR,  1 + ankle to lower 1/3 calf edema bilat  MS: moves all extremities without noticeable abnormality  PSYCH: pleasant and cooperative, no obvious depression or anxiety  ASSESSMENT AND PLAN:  Discussed the following assessment and plan:  Essential hypertension, benign  Leg edema  Mild cognitive impairment  Neck pain  Gastroesophageal reflux disease, esophagitis presence not specified  Recurrent major depressive disorder, in full remission ([place])  Aortic atherosclerosis (HCC)  -for edema: decrease norvasc back to 5, BP lower today on recheck so will monitor, if further medicine needed may try low dose diuretic and check sodium vs acei or arb. Also advised elevation/compression. -reviewed neuro and pulm notes and related plan as she had forgotten. She reports neuro referred for PT - which notes indicate so she plans to call there to check on the PT. -labs -added  statin after lengthy discussion CV risks, she is very concerned about the findings on her MRI. She also has aortic atherosclerosis. Pravastatin 86m added per her preference. -AWV/labs, follow up in next 1-2 months -Patient advised to return or notify a doctor immediately if symptoms worsen or persist or new concerns arise.  Patient Instructions  BEFORE YOU LEAVE: -follow up:  1) AWV w/ SManuela Schwartzand follow up with Dr. KMaudie Mercuryon a Tuesday in 3-10 weeks  Decrease the Norvasc to 5 mg (1 tablet) daily  Get the physical therapy as your neurologist advised.  Start the cholesterol medication, pravastatin 236mdaily.  Use compression sock daily, elevate legs for 30 minutes twice daily.  Eat a healthy diet and get regular  exercise.  Dr. Lamonte Sakai recommended Nexium 75m daily for the acid reflux and clearing the throat.   HLucretia Kern DO

## 2017-05-16 NOTE — Telephone Encounter (Signed)
Returned call to pt.  No answer.  No answering machine/voice mail.  Unable to relay MRI results.

## 2017-05-16 NOTE — Telephone Encounter (Signed)
Pt called our office to get these results.  Pt states she does not get on to mychart and prefers a call back. Pt states she is going out in an hour, but has an appt with Dr Maudie Mercury tomorrow.

## 2017-05-16 NOTE — Telephone Encounter (Signed)
Left detailed message relaying MRI results.

## 2017-05-16 NOTE — Telephone Encounter (Signed)
Pt left a VM message wanting her MRI results

## 2017-05-17 ENCOUNTER — Ambulatory Visit (INDEPENDENT_AMBULATORY_CARE_PROVIDER_SITE_OTHER): Payer: Medicare HMO | Admitting: Family Medicine

## 2017-05-17 ENCOUNTER — Encounter: Payer: Self-pay | Admitting: Family Medicine

## 2017-05-17 VITALS — BP 124/58 | HR 93 | Temp 98.2°F | Ht 66.5 in | Wt 200.0 lb

## 2017-05-17 DIAGNOSIS — K219 Gastro-esophageal reflux disease without esophagitis: Secondary | ICD-10-CM | POA: Diagnosis not present

## 2017-05-17 DIAGNOSIS — M542 Cervicalgia: Secondary | ICD-10-CM

## 2017-05-17 DIAGNOSIS — R600 Localized edema: Secondary | ICD-10-CM | POA: Diagnosis not present

## 2017-05-17 DIAGNOSIS — G3184 Mild cognitive impairment, so stated: Secondary | ICD-10-CM

## 2017-05-17 DIAGNOSIS — F3342 Major depressive disorder, recurrent, in full remission: Secondary | ICD-10-CM | POA: Diagnosis not present

## 2017-05-17 DIAGNOSIS — I700 Atherosclerosis of aorta: Secondary | ICD-10-CM | POA: Diagnosis not present

## 2017-05-17 DIAGNOSIS — I10 Essential (primary) hypertension: Secondary | ICD-10-CM

## 2017-05-17 DIAGNOSIS — R69 Illness, unspecified: Secondary | ICD-10-CM | POA: Diagnosis not present

## 2017-05-17 MED ORDER — AMLODIPINE BESYLATE 5 MG PO TABS: 5.0000 mg | ORAL_TABLET | Freq: Every day | ORAL | 3 refills | Status: DC

## 2017-05-17 MED ORDER — PRAVASTATIN SODIUM 20 MG PO TABS: 20.0000 mg | ORAL_TABLET | Freq: Every day | ORAL | 3 refills | Status: AC

## 2017-05-17 NOTE — Patient Instructions (Addendum)
BEFORE YOU LEAVE: -follow up:  1) AWV w/ Manuela Schwartz and follow up with Dr. Maudie Mercury on a Tuesday in 3-10 weeks  Decrease the Norvasc to 5 mg (1 tablet) daily  Get the physical therapy as your neurologist advised.  Start the cholesterol medication, pravastatin 20mg  daily.  Use compression sock daily, elevate legs for 30 minutes twice daily.  Eat a healthy diet and get regular exercise.  Dr. Lamonte Sakai recommended Nexium 40mg  daily for the acid reflux and clearing the throat.

## 2017-05-31 ENCOUNTER — Telehealth: Payer: Self-pay | Admitting: Neurology

## 2017-05-31 ENCOUNTER — Other Ambulatory Visit: Payer: Self-pay

## 2017-05-31 DIAGNOSIS — M5412 Radiculopathy, cervical region: Secondary | ICD-10-CM

## 2017-05-31 NOTE — Telephone Encounter (Signed)
Patient called because she has not heard from anyone regarding her starting physical therapy. Please Call. Thanks

## 2017-05-31 NOTE — Telephone Encounter (Signed)
Referral re-sent to PT
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
## 2017-06-19 ENCOUNTER — Ambulatory Visit (INDEPENDENT_AMBULATORY_CARE_PROVIDER_SITE_OTHER): Payer: Medicare HMO

## 2017-06-19 ENCOUNTER — Telehealth: Payer: Self-pay | Admitting: Family Medicine

## 2017-06-19 VITALS — BP 120/60 | HR 76 | Ht 66.0 in | Wt 196.0 lb

## 2017-06-19 DIAGNOSIS — Z0000 Encounter for general adult medical examination without abnormal findings: Secondary | ICD-10-CM

## 2017-06-19 NOTE — Telephone Encounter (Signed)
Do Not Resuscitate Order to be filled out.  Placed in Dr's folder.  Call [phone] upon completion.

## 2017-06-19 NOTE — Telephone Encounter (Signed)
Form placed in your red folder  

## 2017-06-19 NOTE — Patient Instructions (Addendum)
Christine Maldonado , Thank you for taking time to come for your Medicare Wellness Visit. I appreciate your ongoing commitment to your health goals. Please review the following plan we discussed and let me know if I can assist you in the future.   Also, will contact your neurologist regarding this event today., Would recommend you follow up the her.   May need a vision check   In the future, make a shorter list of things to do. Sometimes it helps to break task down to what needs to be done now. Alzheimers asso has "safe return"  Will consider letting [facility] drive your for the next 3 to 4 months; or be sure you take someone with you. Would advise you to call Dr. Delice Lesch to let her know of this incident.  Develop a plan to get home; (does not have her cell phone)  Does not know how to use it per her comment   [place] has medic alert and safe return; 24 hour emergency response; you can call the Alz asso to find out more [phone]  Since we agree being lost is a bad experience we need to develop an option. Maybe: your son was willing to place "helpers" in your home, so perhaps they can drive you or drive with you.    These are the goals we discussed: Goals    None      This is a list of the screening recommended for you and due dates:  Health Maintenance  Topic Date Due  . Flu Shot  08/30/2017  . Mammogram  10/23/2017  . Tetanus Vaccine  07/01/2019  . Pneumonia vaccines  Completed  . DEXA scan (bone density measurement)  Addressed        Fall Prevention in the Home Falls can cause injuries. They can happen to people of all ages. There are many things you can do to make your home safe and to help prevent falls. What can I do on the outside of my home?  Regularly fix the edges of walkways and driveways and fix any cracks.  Remove anything that might make you trip as you walk through a door, such as a raised step or threshold.  Trim any bushes or trees on the path to  your home.  Use bright outdoor lighting.  Clear any walking paths of anything that might make someone trip, such as rocks or tools.  Regularly check to see if handrails are loose or broken. Make sure that both sides of any steps have handrails.  Any raised decks and porches should have guardrails on the edges.  Have any leaves, snow, or ice cleared regularly.  Use sand or salt on walking paths during winter.  Clean up any spills in your garage right away. This includes oil or grease spills. What can I do in the bathroom?  Use night lights.  Install grab bars by the toilet and in the tub and shower. Do not use towel bars as grab bars.  Use non-skid mats or decals in the tub or shower.  If you need to sit down in the shower, use a plastic, non-slip stool.  Keep the floor dry. Clean up any water that spills on the floor as soon as it happens.  Remove soap buildup in the tub or shower regularly.  Attach bath mats securely with double-sided non-slip rug tape.  Do not have throw rugs and other things on the floor that can make you trip. What can I do  in the bedroom?  Use night lights.  Make sure that you have a light by your bed that is easy to reach.  Do not use any sheets or blankets that are too big for your bed. They should not hang down onto the floor.  Have a firm chair that has side arms. You can use this for support while you get dressed.  Do not have throw rugs and other things on the floor that can make you trip. What can I do in the kitchen?  Clean up any spills right away.  Avoid walking on wet floors.  Keep items that you use a lot in easy-to-reach places.  If you need to reach something above you, use a strong step stool that has a grab bar.  Keep electrical cords out of the way.  Do not use floor polish or wax that makes floors slippery. If you must use wax, use non-skid floor wax.  Do not have throw rugs and other things on the floor that can make  you trip. What can I do with my stairs?  Do not leave any items on the stairs.  Make sure that there are handrails on both sides of the stairs and use them. Fix handrails that are broken or loose. Make sure that handrails are as long as the stairways.  Check any carpeting to make sure that it is firmly attached to the stairs. Fix any carpet that is loose or worn.  Avoid having throw rugs at the top or bottom of the stairs. If you do have throw rugs, attach them to the floor with carpet tape.  Make sure that you have a light switch at the top of the stairs and the bottom of the stairs. If you do not have them, ask someone to add them for you. What else can I do to help prevent falls?  Wear shoes that: ? Do not have high heels. ? Have rubber bottoms. ? Are comfortable and fit you well. ? Are closed at the toe. Do not wear sandals.  If you use a stepladder: ? Make sure that it is fully opened. Do not climb a closed stepladder. ? Make sure that both sides of the stepladder are locked into place. ? Ask someone to hold it for you, if possible.  Clearly mark and make sure that you can see: ? Any grab bars or handrails. ? First and last steps. ? Where the edge of each step is.  Use tools that help you move around (mobility aids) if they are needed. These include: ? Canes. ? Walkers. ? Scooters. ? Crutches.  Turn on the lights when you go into a dark area. Replace any light bulbs as soon as they burn out.  Set up your furniture so you have a clear path. Avoid moving your furniture around.  If any of your floors are uneven, fix them.  If there are any pets around you, be aware of where they are.  Review your medicines with your doctor. Some medicines can make you feel dizzy. This can increase your chance of falling. Ask your doctor what other things that you can do to help prevent falls. This information is not intended to replace advice given to you by your health care provider.  Make sure you discuss any questions you have with your health care provider. Document Released: 11/12/2008 Document Revised: 06/24/2015 Document Reviewed: 02/20/2014 Elsevier Interactive Patient Education  2018 [place] Maintenance, Female Adopting a healthy lifestyle  and getting preventive care can go a long way to promote health and wellness. Talk with your health care provider about what schedule of regular examinations is right for you. This is a good chance for you to check in with your provider about disease prevention and staying healthy. In between checkups, there are plenty of things you can do on your own. Experts have done a lot of research about which lifestyle changes and preventive measures are most likely to keep you healthy. Ask your health care provider for more information. Weight and diet Eat a healthy diet  Be sure to include plenty of vegetables, fruits, low-fat dairy products, and lean protein.  Do not eat a lot of foods high in solid fats, added sugars, or salt.  Get regular exercise. This is one of the most important things you can do for your health. ? Most adults should exercise for at least 150 minutes each week. The exercise should increase your heart rate and make you sweat (moderate-intensity exercise). ? Most adults should also do strengthening exercises at least twice a week. This is in addition to the moderate-intensity exercise.  Maintain a healthy weight  Body mass index (BMI) is a measurement that can be used to identify possible weight problems. It estimates body fat based on height and weight. Your health care provider can help determine your BMI and help you achieve or maintain a healthy weight.  For females 60 years of age and older: ? A BMI below 18.5 is considered underweight. ? A BMI of 18.5 to 24.9 is normal. ? A BMI of 25 to 29.9 is considered overweight. ? A BMI of 30 and above is considered obese.  Watch levels of cholesterol  and blood lipids  You should start having your blood tested for lipids and cholesterol at 82 years of age, then have this test every 5 years.  You may need to have your cholesterol levels checked more often if: ? Your lipid or cholesterol levels are high. ? You are older than 82 years of age. ? You are at high risk for heart disease.  Cancer screening Lung Cancer  Lung cancer screening is recommended for adults 71-33 years old who are at high risk for lung cancer because of a history of smoking.  A yearly low-dose CT scan of the lungs is recommended for people who: ? Currently smoke. ? Have quit within the past 15 years. ? Have at least a 30-pack-year history of smoking. A pack year is smoking an average of one pack of cigarettes a day for 1 year.  Yearly screening should continue until it has been 15 years since you quit.  Yearly screening should stop if you develop a health problem that would prevent you from having lung cancer treatment.  Breast Cancer  Practice breast self-awareness. This means understanding how your breasts normally appear and feel.  It also means doing regular breast self-exams. Let your health care provider know about any changes, no matter how small.  If you are in your 20s or 30s, you should have a clinical breast exam (CBE) by a health care provider every 1-3 years as part of a regular health exam.  If you are 70 or older, have a CBE every year. Also consider having a breast X-ray (mammogram) every year.  If you have a family history of breast cancer, talk to your health care provider about genetic screening.  If you are at high risk for breast cancer, talk to your health  care provider about having an MRI and a mammogram every year.  Breast cancer gene (BRCA) assessment is recommended for women who have family members with BRCA-related cancers. BRCA-related cancers include: ? Breast. ? Ovarian. ? Tubal. ? Peritoneal cancers.  Results of the  assessment will determine the need for genetic counseling and BRCA1 and BRCA2 testing.  Cervical Cancer Your health care provider may recommend that you be screened regularly for cancer of the pelvic organs (ovaries, uterus, and vagina). This screening involves a pelvic examination, including checking for microscopic changes to the surface of your cervix (Pap test). You may be encouraged to have this screening done every 3 years, beginning at age 28.  For women ages 34-65, health care providers may recommend pelvic exams and Pap testing every 3 years, or they may recommend the Pap and pelvic exam, combined with testing for human papilloma virus (HPV), every 5 years. Some types of HPV increase your risk of cervical cancer. Testing for HPV may also be done on women of any age with unclear Pap test results.  Other health care providers may not recommend any screening for nonpregnant women who are considered low risk for pelvic cancer and who do not have symptoms. Ask your health care provider if a screening pelvic exam is right for you.  If you have had past treatment for cervical cancer or a condition that could lead to cancer, you need Pap tests and screening for cancer for at least 20 years after your treatment. If Pap tests have been discontinued, your risk factors (such as having a new sexual partner) need to be reassessed to determine if screening should resume. Some women have medical problems that increase the chance of getting cervical cancer. In these cases, your health care provider may recommend more frequent screening and Pap tests.  Colorectal Cancer  This type of cancer can be detected and often prevented.  Routine colorectal cancer screening usually begins at 82 years of age and continues through 82 years of age.  Your health care provider may recommend screening at an earlier age if you have risk factors for colon cancer.  Your health care provider may also recommend using home test  kits to check for hidden blood in the stool.  A small camera at the end of a tube can be used to examine your colon directly (sigmoidoscopy or colonoscopy). This is done to check for the earliest forms of colorectal cancer.  Routine screening usually begins at age 18.  Direct examination of the colon should be repeated every 5-10 years through 82 years of age. However, you may need to be screened more often if early forms of precancerous polyps or small growths are found.  Skin Cancer  Check your skin from head to toe regularly.  Tell your health care provider about any new moles or changes in moles, especially if there is a change in a mole's shape or color.  Also tell your health care provider if you have a mole that is larger than the size of a pencil eraser.  Always use sunscreen. Apply sunscreen liberally and repeatedly throughout the day.  Protect yourself by wearing long sleeves, pants, a wide-brimmed hat, and sunglasses whenever you are outside.  Heart disease, diabetes, and high blood pressure  High blood pressure causes heart disease and increases the risk of stroke. High blood pressure is more likely to develop in: ? People who have blood pressure in the high end of the normal range (130-139/85-89 mm Hg). ?  People who are overweight or obese. ? People who are African American.  If you are 63-62 years of age, have your blood pressure checked every 3-5 years. If you are 11 years of age or older, have your blood pressure checked every year. You should have your blood pressure measured twice-once when you are at a hospital or clinic, and once when you are not at a hospital or clinic. Record the average of the two measurements. To check your blood pressure when you are not at a hospital or clinic, you can use: ? An automated blood pressure machine at a pharmacy. ? A home blood pressure monitor.  If you are between 81 years and 25 years old, ask your health care provider if you  should take aspirin to prevent strokes.  Have regular diabetes screenings. This involves taking a blood sample to check your fasting blood sugar level. ? If you are at a normal weight and have a low risk for diabetes, have this test once every three years after 82 years of age. ? If you are overweight and have a high risk for diabetes, consider being tested at a younger age or more often. Preventing infection Hepatitis B  If you have a higher risk for hepatitis B, you should be screened for this virus. You are considered at high risk for hepatitis B if: ? You were born in a country where hepatitis B is common. Ask your health care provider which countries are considered high risk. ? Your parents were born in a high-risk country, and you have not been immunized against hepatitis B (hepatitis B vaccine). ? You have HIV or AIDS. ? You use needles to inject street drugs. ? You live with someone who has hepatitis B. ? You have had sex with someone who has hepatitis B. ? You get hemodialysis treatment. ? You take certain medicines for conditions, including cancer, organ transplantation, and autoimmune conditions.  Hepatitis C  Blood testing is recommended for: ? Everyone born from [birth year] through 1963. ? Anyone with known risk factors for hepatitis C.  Sexually transmitted infections (STIs)  You should be screened for sexually transmitted infections (STIs) including gonorrhea and chlamydia if: ? You are sexually active and are younger than 82 years of age. ? You are older than 82 years of age and your health care provider tells you that you are at risk for this type of infection. ? Your sexual activity has changed since you were last screened and you are at an increased risk for chlamydia or gonorrhea. Ask your health care provider if you are at risk.  If you do not have HIV, but are at risk, it may be recommended that you take a prescription medicine daily to prevent HIV infection. This is  called pre-exposure prophylaxis (PrEP). You are considered at risk if: ? You are sexually active and do not regularly use condoms or know the HIV status of your partner(s). ? You take drugs by injection. ? You are sexually active with a partner who has HIV.  Talk with your health care provider about whether you are at high risk of being infected with HIV. If you choose to begin PrEP, you should first be tested for HIV. You should then be tested every 3 months for as long as you are taking PrEP. Pregnancy  If you are premenopausal and you may become pregnant, ask your health care provider about preconception counseling.  If you may become pregnant, take 400 to 800 micrograms (mcg)  of folic acid every day.  If you want to prevent pregnancy, talk to your health care provider about birth control (contraception). Osteoporosis and menopause  Osteoporosis is a disease in which the bones lose minerals and strength with aging. This can result in serious bone fractures. Your risk for osteoporosis can be identified using a bone density scan.  If you are 98 years of age or older, or if you are at risk for osteoporosis and fractures, ask your health care provider if you should be screened.  Ask your health care provider whether you should take a calcium or vitamin D supplement to lower your risk for osteoporosis.  Menopause may have certain physical symptoms and risks.  Hormone replacement therapy may reduce some of these symptoms and risks. Talk to your health care provider about whether hormone replacement therapy is right for you. Follow these instructions at home:  Schedule regular health, dental, and eye exams.  Stay current with your immunizations.  Do not use any tobacco products including cigarettes, chewing tobacco, or electronic cigarettes.  If you are pregnant, do not drink alcohol.  If you are breastfeeding, limit how much and how often you drink alcohol.  Limit alcohol intake to  no more than 1 drink per day for nonpregnant women. One drink equals 12 ounces of beer, 5 ounces of wine, or 1 ounces of hard liquor.  Do not use street drugs.  Do not share needles.  Ask your health care provider for help if you need support or information about quitting drugs.  Tell your health care provider if you often feel depressed.  Tell your health care provider if you have ever been abused or do not feel safe at home. This information is not intended to replace advice given to you by your health care provider. Make sure you discuss any questions you have with your health care provider. Document Released: 08/01/2010 Document Revised: 06/24/2015 Document Reviewed: 10/20/2014 Elsevier Interactive Patient Education  [employer].

## 2017-06-21 NOTE — Telephone Encounter (Signed)
Needs appt for this with susan and me.

## 2017-06-21 NOTE — Telephone Encounter (Signed)
I called the pt and scheduled appts for 5/28.

## 2017-06-22 ENCOUNTER — Telehealth: Payer: Self-pay | Admitting: Family Medicine

## 2017-06-22 NOTE — Telephone Encounter (Signed)
I called Rob and informed him of the message below.

## 2017-06-22 NOTE — Telephone Encounter (Signed)
Please let him know im out of office until Tuesday.

## 2017-06-22 NOTE — Telephone Encounter (Signed)
Copied from [place] [id number]. Topic: Inquiry >> Jun 21, 2017  4:16 PM Conception Chancy, NT wrote: Reason for CRM: Leonor Liv is a speech therapist calling from [facility] and would like to follow up on orders that he faxed over. Please contact.  Rob cb# [phone]

## 2017-06-22 NOTE — Telephone Encounter (Signed)
Fax received today and placed on your desk.

## 2017-06-25 NOTE — Progress Notes (Signed)
Inetta Dicke R Kazuo Durnil, DO  

## 2017-06-26 ENCOUNTER — Ambulatory Visit: Payer: Medicare HMO

## 2017-06-26 ENCOUNTER — Encounter: Payer: Self-pay | Admitting: Family Medicine

## 2017-06-26 ENCOUNTER — Ambulatory Visit (INDEPENDENT_AMBULATORY_CARE_PROVIDER_SITE_OTHER): Payer: Medicare HMO | Admitting: Family Medicine

## 2017-06-26 VITALS — BP 138/60 | HR 94 | Temp 98.2°F | Ht 66.0 in

## 2017-06-26 DIAGNOSIS — R413 Other amnesia: Secondary | ICD-10-CM | POA: Diagnosis not present

## 2017-06-26 LAB — POCT URINALYSIS DIPSTICK
Bilirubin, UA: NEGATIVE
Glucose, UA: NEGATIVE
Ketones, UA: NEGATIVE
Leukocytes, UA: NEGATIVE
Nitrite, UA: NEGATIVE
PH UA: 5 (ref 5.0–8.0)
PROTEIN UA: NEGATIVE
RBC UA: NEGATIVE
SPEC GRAV UA: 1.02 (ref 1.010–1.025)
UROBILINOGEN UA: 0.2 U/dL

## 2017-06-26 NOTE — Patient Instructions (Addendum)
BEFORE YOU LEAVE: -neurology appointment -check udip with reflux -follow up: with new doctor as soon as possible - here as needed in the interim.  Please see neurologist about the worsening memory issues. Talk with neurologist and son/daughter about the DNR form and your questions.  Please check with the facility to see if they have an Advanced Directive for you because this is specific regarding your wishes for prolonging your life  Please discuss the [place], the living will and the Do Not Resuscitate forms with your children.

## 2017-06-26 NOTE — Telephone Encounter (Signed)
Per Dr Maudie Mercury I called Rob and left a detailed message to inform him due to the diagnosis this should be forwarded to the pts neurologist as this is the provider treating her for confusion.

## 2017-06-26 NOTE — Progress Notes (Signed)
Follow up to AD discussion during AWV on 06/19/2017 - Per Dr. Julianne Rice order   Facility asked Ms. Key to complete the DNR. Please check with the facility to see if they have an Advanced Directive for you because this is specific regarding your wishes for prolonging your life    Discussed AD today; States at her last visit that she had completed an AD. There is a release of information for her son and dtr in the chart.  I explained the situation in event her heart stops or she stops breathing. Ms. Vanwyk answered, " I am not sure, should I do this".    Referred her to her son and dtr to discuss DNR order as well as review with them their Advanced Directives Reviewed AD and Living will on [place] form.  Dr Maudie Mercury to sign  I have reviewed the documentation for AWV and Advanced Care Planning provided by the Health Coach and agree with the documentation. I was immediately available for any questions.    HPI:  Using dictation device. Unfortunately this device frequently misinterprets words/phrases.  Acute visit for advanced directive questions. Asked by her facility to complete DNR. They also want speech therapy for worsening dementia. She feels dementia is worsening rapidly. Did not know how to get here today and this caused her a lot of stress. A friend drover her. She plans to transfer her care to the geriatrician at [facility] after today as reports it is too much stress driving here. Denies fevers, malaise, dysuria, cough. Did not know if she wanted to complete the DNR, wanted to know what are nurse would recommend. Sees neurologist for her dementia. Son is a physician and involved in her care but she reports [place] is out of [place]. The memory issues are causing her to feel down and anxious, no SI or thoughts of harm.  ROS: See pertinent positives and negatives per HPI.  Past Medical History:  Diagnosis Date  . Anemia   . Bleeding 1993   BTB on HRT-endo biopsy proe with focal  breakdown  . Bleeding 1995   BTB on HRT-benign endo biopsy  . Breast cyst 12/95   right breast  . Colon polyps   . Depression   . History of breast cancer 2008   DCIS/invasive ductal cancer right breast ER+/PR+  . Hot flashes 02/09/2014  . Hyperglycemia 02/09/2014   [facility] 03/2013 [facility]   . Hypertension   . Migraine   . Osteopenia   . Piriformis muscle pain   . Plantar fasciitis   . PUD (peptic ulcer disease) 8/94   Dr Magod-endoscopy  . Seasonal allergies   . Squamous cell carcinoma 6/11 7/11   on leg-removed  . Ulcer   . UTI (urinary tract infection)     Past Surgical History:  Procedure Laterality Date  . BREAST EXCISIONAL BIOPSY Right 2008   benign  . BREAST LUMPECTOMY Right 06/06/2006   right inner quadrant - invasive with sentinel node biopsy, ER/ PR +;  Her2 Nu negtive  . DILATION AND CURETTAGE OF UTERUS  50+ years ago   for retained placenta  . DILATION AND CURETTAGE OF UTERUS  1986  . KNEE ARTHROSCOPY  2016   Dr. Novella Olive    Family History  Problem Relation Age of Onset  . Ovarian cancer Mother   . Hypertension Mother   . Arthritis Father   . Cancer Father   . Heart attack Brother   . Diabetes Other  granddaughter  . Kidney failure Sister     SOCIAL HX: see hpi   Current Outpatient Medications:  .  amLODipine (NORVASC) 5 MG tablet, Take 1 tablet (5 mg total) by mouth daily., Disp: 90 tablet, Rfl: 3 .  Ascorbic Acid (VITAMIN C PO), Take 1,000 mg by mouth daily. , Disp: , Rfl:  .  CALCIUM PO, Take 600 mg by mouth daily. , Disp: , Rfl:  .  Cholecalciferol 1000 units capsule, Take 2,000 Units by mouth daily., Disp: , Rfl:  .  Coenzyme Q10 (CO Q 10 PO), Take by mouth daily., Disp: , Rfl:  .  CRANBERRY PO, Take 84 mg by mouth daily. , Disp: , Rfl:  .  docusate sodium (COLACE) 100 MG capsule, Take 200 mg by mouth at bedtime. , Disp: , Rfl:  .  fexofenadine (ALLEGRA) 180 MG tablet, Take 180 mg by mouth daily., Disp: , Rfl:  .  Garlic 6967 MG  CAPS, Take 1,000 mg by mouth daily., Disp: , Rfl:  .  Multiple Vitamin (MULTIVITAMIN) capsule, Take 1 capsule by mouth daily.  , Disp: , Rfl:  .  NON FORMULARY, Tumeric, Disp: , Rfl:  .  Omega-3 Fatty Acids (FISH OIL) 1200 MG CAPS, Take 1 capsule by mouth daily., Disp: , Rfl:  .  pravastatin (PRAVACHOL) 20 MG tablet, Take 1 tablet (20 mg total) by mouth daily., Disp: 90 tablet, Rfl: 3 .  Probiotic Product (PROBIOTIC DAILY PO), Take by mouth daily., Disp: , Rfl:  .  venlafaxine (EFFEXOR) 75 MG tablet, Take 1.5 tablets by mouth once a day, Disp: 45 tablet, Rfl: 1  EXAM:  Vitals:   06/26/17 1413  BP: 138/60  Pulse: 94  Temp: 98.2 F (36.8 C)  SpO2: 96%    Body mass index is 31.64 kg/m.  GENERAL: vitals reviewed and listed above, alert, oriented, appears well hydrated and in no acute distress  HEENT: atraumatic, conjunttiva clear, no obvious abnormalities on inspection of external nose and ears  NECK: no obvious masses on inspection  LUNGS: clear to auscultation bilaterally, no wheezes, rales or rhonchi, good air movement  CV: HRRR, no peripheral edema  MS: moves all extremities without noticeable abnormality  PSYCH: pleasant and cooperative, she does not recall how to get here or to friends home, she has difficulty recalling names prior providers/health events from the past  ASSESSMENT AND PLAN:  Discussed the following assessment and plan:  Memory loss - Plan: POC Urinalysis Dipstick  -it seems her dementia may be progressing, may need neuropsych testing to evaluate if this is worsening dementia __> depression/anxiety or v/v; may need to see psychiatry as well - no concerns or safety or thoughts of harm -assistant got her a f/u with her neurologist for next week -will check urine today, recent labs reviewed -advised Manuela Schwartz, RN to reach out to family regarding this -she doesn't seem to be in a good state for completion DNR today and plans to see neurology and discuss with  her family -she plans to transfer care to geriatrician - she has seen the physician before and did not like their scheduling habits, but she wants to see a doctor closer. We will miss her. Did advise she see [place] in the interim as needed until re-establishes with geriatrician.  -Patient advised to return or notify a doctor immediately if symptoms worsen or persist or new concerns arise.  Patient Instructions  BEFORE YOU LEAVE: -neurology appointment -check udip with reflux -follow up: with new doctor as  soon as possible - here as needed in the interim.  Please see neurologist about the worsening memory issues. Talk with neurologist and son/daughter about the DNR form and your questions.  Please check with the facility to see if they have an Advanced Directive for you because this is specific regarding your wishes for prolonging your life  Please discuss the [place], the living will and the Do Not Resuscitate forms with your children.      Lucretia Kern, DO

## 2017-06-27 ENCOUNTER — Telehealth: Payer: Self-pay

## 2017-06-27 NOTE — Telephone Encounter (Signed)
Thank you Susan!

## 2017-06-27 NOTE — Telephone Encounter (Signed)
Outreach to contact on file for Ms. Petsch, Ms Ardine Eng. Ms. Basilia Jumbo identified herself.  Let Ms. Click  know Ms. Thai brought a DNR form to the office 5/28 and reviewed the explanation of this form given to the patient, as well the fact she verbalized lack of clarity as to what she should do. Was sent back to the facility with the DNR and told to discuss with her family. Ms. Basilia Jumbo stated she had reported the same information to the family.  Dr. Maudie Mercury got an apt with the neurologist for Monday due to what seems to be a decrease in her mental status functioning. (the patient did have a driver bring her from [facility] as she stated she could not remember how to get to the office )   Ms. Hittle is considering the geriatrician at the [place] facility of which she resides to assume her care. Dr. Maudie Mercury stated she would be happy to continue to see her or do whatever the family feels is best.  Dr Maudie Mercury is available for any questions are follow-up.  Recent labs were reviewed with Ms. Click at her request, as well as her current meds.   Will outreach as needed. The family will not be at the apt on Monday but the son who is a neurologist will plan to call the neurologist after her mother's OV   Wynetta Fines RN

## 2017-07-02 ENCOUNTER — Ambulatory Visit: Payer: Medicare HMO | Admitting: Neurology

## 2017-07-03 ENCOUNTER — Ambulatory Visit: Payer: Medicare HMO | Admitting: Family Medicine

## 2017-07-05 DIAGNOSIS — R41841 Cognitive communication deficit: Secondary | ICD-10-CM | POA: Diagnosis not present

## 2017-07-06 DIAGNOSIS — R41841 Cognitive communication deficit: Secondary | ICD-10-CM | POA: Diagnosis not present

## 2017-07-10 DIAGNOSIS — R41841 Cognitive communication deficit: Secondary | ICD-10-CM | POA: Diagnosis not present

## 2017-07-13 DIAGNOSIS — R41841 Cognitive communication deficit: Secondary | ICD-10-CM | POA: Diagnosis not present

## 2017-07-16 DIAGNOSIS — R41841 Cognitive communication deficit: Secondary | ICD-10-CM | POA: Diagnosis not present

## 2017-07-18 DIAGNOSIS — R41841 Cognitive communication deficit: Secondary | ICD-10-CM | POA: Diagnosis not present

## 2017-07-20 DIAGNOSIS — R41841 Cognitive communication deficit: Secondary | ICD-10-CM | POA: Diagnosis not present

## 2017-07-24 DIAGNOSIS — R41841 Cognitive communication deficit: Secondary | ICD-10-CM | POA: Diagnosis not present

## 2017-07-26 DIAGNOSIS — R41841 Cognitive communication deficit: Secondary | ICD-10-CM | POA: Diagnosis not present

## 2017-07-30 ENCOUNTER — Encounter: Payer: Self-pay | Admitting: Internal Medicine

## 2017-07-30 ENCOUNTER — Non-Acute Institutional Stay: Payer: Medicare HMO | Admitting: Internal Medicine

## 2017-07-30 VITALS — BP 132/66 | HR 95 | Temp 98.4°F | Resp 16 | Ht 68.0 in | Wt 202.0 lb

## 2017-07-30 DIAGNOSIS — F339 Major depressive disorder, recurrent, unspecified: Secondary | ICD-10-CM | POA: Diagnosis not present

## 2017-07-30 DIAGNOSIS — E782 Mixed hyperlipidemia: Secondary | ICD-10-CM | POA: Diagnosis not present

## 2017-07-30 DIAGNOSIS — I10 Essential (primary) hypertension: Secondary | ICD-10-CM

## 2017-07-30 DIAGNOSIS — R05 Cough: Secondary | ICD-10-CM | POA: Diagnosis not present

## 2017-07-30 DIAGNOSIS — R69 Illness, unspecified: Secondary | ICD-10-CM | POA: Diagnosis not present

## 2017-07-30 DIAGNOSIS — G3184 Mild cognitive impairment, so stated: Secondary | ICD-10-CM

## 2017-07-30 DIAGNOSIS — K5909 Other constipation: Secondary | ICD-10-CM

## 2017-07-30 DIAGNOSIS — R053 Chronic cough: Secondary | ICD-10-CM

## 2017-07-30 DIAGNOSIS — R41841 Cognitive communication deficit: Secondary | ICD-10-CM | POA: Diagnosis not present

## 2017-07-30 MED ORDER — AMLODIPINE BESYLATE 5 MG PO TABS: 5.0000 mg | ORAL_TABLET | Freq: Every day | ORAL | 3 refills | Status: AC

## 2017-07-30 MED ORDER — VENLAFAXINE HCL ER 75 MG PO CP24: 75.0000 mg | ORAL_CAPSULE | Freq: Two times a day (BID) | ORAL | 3 refills | Status: AC

## 2017-07-30 NOTE — Progress Notes (Signed)
Location:  [place] of Service:  Clinic (12) Provider:  Blanchie Serve MD  Lucretia Kern, DO  Patient Care Team: Lucretia Kern, DO as PCP - General (Family Medicine) Cameron Sprang, MD as Consulting Physician (Neurology)  Extended Emergency Contact Information Primary Emergency Contact: [facility] Address: [place] [address] Johnnette Litter of [place] Phone: [phone] Relation: Daughter  Goals of care: Advanced Directive information Advanced Directives 06/19/2017  Does Patient Have a Medical Advance Directive? Yes     Chief Complaint  Patient presents with  . New Patient (Initial Visit)    Establishing care with Dr. Bubba Camp. Patient mentiond that she working with Rob( Astronomer) for memory problems. No other acute concerns at this time.   . Medication Refill    Norvasc and venlafaxine (pending)    HPI:  Pt is a 84 y.o. female seen today for establishing care. She was following with Dr Colin Benton with [facility] as her PCP. She was last seen 06/26/17  Depression- chronic. She takes venlafaxine 75 mg bid. Mood overall fair. Has chronic anxiety as well.   Hypertension- takes Amlodipine 7.5 mg daily. Tolerating it well. BP today in office looks good. No headache, chest pain or dyspnea.   Constipation- takes Colace 200 mg qhs, this helps.   Chronic cough- on Fexofenadine 180 mg daily and mentions it helps. Has chronic cough. Followed by Dr Lamonte Sakai. History fo smoking. ? Mild asthma ? GERD. Supposed to be on nexium 40 mg daily with flonase and allegra. Denies nasal congestion and has not been taking flonase. Not sure if taking nexium. Reviewed Dr Agustina Caroli note from 04/03/17  Mild cognitive impairment- seen by neurology Dr Delice Lesch on 04/27/17, [place] 25/30, supportive therapy for now. On cranberry and turmeric supplement.    Past Medical History:  Diagnosis Date  . Anemia   . Bleeding 1993   BTB on HRT-endo  biopsy proe with focal breakdown  . Bleeding 1995   BTB on HRT-benign endo biopsy  . Breast cyst 12/95   right breast  . Colon polyps   . Depression   . History of breast cancer 2008   DCIS/invasive ductal cancer right breast ER+/PR+  . Hot flashes 02/09/2014  . Hyperglycemia 02/09/2014   Prediabetes labs 03/2013 at [facility]   . Hypertension   . Migraine   . Osteopenia   . Piriformis muscle pain   . Plantar fasciitis   . PUD (peptic ulcer disease) 8/94   Dr Magod-endoscopy  . Seasonal allergies   . Squamous cell carcinoma 6/11 7/11   on leg-removed  . Ulcer   . UTI (urinary tract infection)    Past Surgical History:  Procedure Laterality Date  . BREAST EXCISIONAL BIOPSY Right 2008   benign  . BREAST LUMPECTOMY Right 06/06/2006   right inner quadrant - invasive with sentinel node biopsy, ER/ PR +;  Her2 Nu negtive  . DILATION AND CURETTAGE OF UTERUS  50+ years ago   for retained placenta  . DILATION AND CURETTAGE OF UTERUS  1986  . KNEE ARTHROSCOPY  2016   Dr. Novella Olive    Allergies  Allergen Reactions  . Amoxicillin     GI upset  . Asa [Aspirin]     Had ulcer/ told not to take  . Erythrocin   . Diflucan [Fluconazole] Rash  . Nitrofuran Derivatives Rash    Outpatient Encounter Medications as  of 07/30/2017  Medication Sig  . amLODipine (NORVASC) 5 MG tablet Take 5 mg by mouth daily. Take one and half tablet  . Coenzyme Q10 (CO Q 10 PO) Take 1 tablet by mouth daily.   Marland Kitchen docusate sodium (COLACE) 100 MG capsule Take 200 mg by mouth at bedtime.   . fexofenadine (ALLEGRA) 180 MG tablet Take 180 mg by mouth daily.  . Multiple Vitamin (MULTIVITAMIN) capsule Take 1 capsule by mouth daily.    . NON FORMULARY Take 1 tablet by mouth daily. Focus Factor   . Plant Sterols and Stanols (CHOLESTOFF PO) Take 2 tablets by mouth daily.  . Probiotic Product (PROBIOTIC DAILY PO) Take 2 tablets by mouth daily.   Marland Kitchen venlafaxine XR (EFFEXOR-XR) 75 MG 24 hr capsule Take 75 mg by mouth 2 (two) times  daily.  Marland Kitchen CALCIUM PO Take 600 mg by mouth daily.   . Cholecalciferol 1000 units capsule Take 2,000 Units by mouth daily.  Marland Kitchen CRANBERRY PO Take 84 mg by mouth daily.   . Garlic 4401 MG CAPS Take 1,000 mg by mouth daily.  . NON FORMULARY Tumeric  . Omega-3 Fatty Acids (FISH OIL) 1200 MG CAPS Take 1 capsule by mouth daily.  . pravastatin (PRAVACHOL) 20 MG tablet Take 1 tablet (20 mg total) by mouth daily. (Patient not taking: Reported on 07/30/2017)  . [DISCONTINUED] amLODipine (NORVASC) 5 MG tablet Take 1 tablet (5 mg total) by mouth daily.  . [DISCONTINUED] Ascorbic Acid (VITAMIN C PO) Take 1,000 mg by mouth daily.   . [DISCONTINUED] venlafaxine (EFFEXOR) 75 MG tablet Take 1.5 tablets by mouth once a day   No facility-administered encounter medications on file as of 07/30/2017.     Review of Systems  Constitutional: Negative for appetite change, chills, diaphoresis and fever.  HENT: Positive for rhinorrhea. Negative for congestion, ear discharge, ear pain, hearing loss, mouth sores, postnasal drip, sinus pressure, sinus pain, sore throat and trouble swallowing.   Eyes: Positive for visual disturbance. Negative for pain, discharge and itching.       Follows with ophthalmology  Respiratory: Negative for cough, choking and shortness of breath.        Follows with Dr Lamonte Sakai  Cardiovascular: Negative for chest pain, palpitations and leg swelling.  Gastrointestinal: Positive for constipation. Negative for abdominal pain, diarrhea, nausea and vomiting.  Genitourinary: Negative for dysuria, frequency, hematuria and pelvic pain.       Wakes up twice at night to urinate, has continence  Musculoskeletal: Negative for back pain and gait problem.       No fall reported, no assistive device  Skin: Negative for rash and wound.  Neurological: Positive for tremors. Negative for dizziness, numbness and headaches.  Hematological: Does not bruise/bleed easily.  Psychiatric/Behavioral: Positive for confusion and  dysphoric mood. Negative for behavioral problems, hallucinations, self-injury, sleep disturbance and suicidal ideas.    Immunization History  Administered Date(s) Administered  . Influenza, High Dose Seasonal PF 10/21/2014, 11/09/2015, 10/05/2016  . Influenza,inj,Quad PF,6+ Mos 10/20/2013  . PPD Test 12/07/2014  . Pneumococcal Conjugate-13 04/17/2013  . Pneumococcal Polysaccharide-23 05/18/2014   Pertinent  Health Maintenance Due  Topic Date Due  . INFLUENZA VACCINE  08/30/2017  . MAMMOGRAM  10/23/2017  . PNA vac Low Risk Adult  Completed  . DEXA SCAN  Addressed   Fall Risk  06/19/2017 04/27/2017 04/27/2017 05/10/2016 07/27/2015  Falls in the past year? No No No No Yes  Number falls in past yr: - - - - 1  Injury with Fall? - - - - -  Risk for fall due to : - - - - -   Functional Status Survey:    Vitals:   07/30/17 1401  BP: 132/66  Pulse: 95  Resp: 16  Temp: 98.4 F (36.9 C)  TempSrc: Oral  SpO2: 97%  Weight: 202 lb (91.6 kg)  Height: '5\' 8"'$  (1.727 m)   Body mass index is 30.71 kg/m.   Wt Readings from Last 3 Encounters:  07/30/17 202 lb (91.6 kg)  06/19/17 196 lb (88.9 kg)  05/17/17 200 lb (90.7 kg)    Physical Exam  Constitutional: She is oriented to person, place, and time. No distress.  Obese elderly female   HENT:  Head: Normocephalic and atraumatic.  Mouth/Throat: Oropharynx is clear and moist.  Eyes: Pupils are equal, round, and reactive to light. Conjunctivae and EOM are normal. Right eye exhibits no discharge. Left eye exhibits no discharge.  Neck: Normal range of motion. Neck supple.  Cardiovascular: Normal rate, regular rhythm and intact distal pulses.  Pulmonary/Chest: Effort normal and breath sounds normal. She has no wheezes. She has no rales.  Abdominal: Soft. Bowel sounds are normal. There is no tenderness. There is no guarding.  Musculoskeletal: Normal range of motion. She exhibits no edema or tenderness.  No spinal or paraspinal tenderness.  Able to move all 4 extremities  Lymphadenopathy:    She has no cervical adenopathy.  Neurological: She is alert and oriented to person, place, and time. She exhibits normal muscle tone.  Skin: Skin is warm and dry. No rash noted. She is not diaphoretic. No erythema.  Psychiatric: She has a normal mood and affect. Her behavior is normal.    Labs reviewed: Recent Labs    10/05/16 1045 02/13/17 1559 03/05/17 1012  NA 136 128* 136  K 4.1 3.6 4.2  CL 95* 87* 100  CO2 34* 34* 30  GLUCOSE 93 93 92  BUN '20 21 17  '$ CREATININE 0.86 0.92 0.79  CALCIUM 9.6 8.9 9.0   No results for input(s): AST, ALT, ALKPHOS, BILITOT, PROT, ALBUMIN in the last 8760 hours. Recent Labs    10/05/16 1045 02/13/17 1559  WBC 6.0 7.8  HGB 13.6 12.5  HCT 41.3 37.6  MCV 92.1 91.9  PLT 283.0 374.0   Lab Results  Component Value Date   TSH 2.39 04/27/2017   Lab Results  Component Value Date   HGBA1C 6.3 02/13/2017   Lab Results  Component Value Date   CHOL 216 (H) 01/12/2016   HDL 83.50 01/12/2016   LDLCALC 116 (H) 01/12/2016   TRIG 83.0 01/12/2016   CHOLHDL 3 01/12/2016    Significant Diagnostic Results in last 30 days:  No results found.  Assessment/Plan  1. Essential hypertension, benign Stable continue amlodipine 7.5 mg daily and monitor. Off HCTZ noted. Reviewed prior PCP OV note.  - amLODipine (NORVASC) 5 MG tablet; Take 1 tablet (5 mg total) by mouth daily. Take one and half tablet  Dispense: 135 tablet; Refill: 3 - CMP; Future - Lipid Panel; Future - CBC (no diff); Future - TSH; Future  2. Mild cognitive impairment Reviewed neurology note. Holding off on acetylcholinesterase inhibitor for now. Has f/u in 10/19 - Lipid Panel; Future - TSH; Future  3. Episode of recurrent major depressive disorder, unspecified depression episode severity ([place]) Continue current regimen of venlafaxine - venlafaxine XR (EFFEXOR-XR) 75 MG 24 hr capsule; Take 1 capsule (75 mg total) by mouth 2 (two)  times daily.  Dispense: 180 capsule; Refill: 3  4. Chronic cough Continue allegra. Advised to take nexium 40 mg daily and flonase daily.  5. Chronic constipation Continue colace 200 mg qhs  6. Mixed hyperlipidemia Continue omega 3- fish oil and cholestoff, not taking statin for unclear reason.  - CMP; Future - Lipid Panel; Future - TSH; Future  uptodate with her annual physical.   Family/ staff Communication: reviewed care plan with patient    Labs/tests ordered:   Lab Orders     CMP     Lipid Panel     CBC (no diff)     TSH    Blanchie Serve, MD Internal Medicine [address] [place], [place] [address] Cell Phone (Monday-Friday 8 am - 5 pm): [phone] On Call: [phone] and follow prompts after 5 pm and on weekends Office Phone: [phone] Office Fax: [phone]

## 2017-07-30 NOTE — Patient Instructions (Signed)
  Remember to take your nexium once a day.   Remember to take your flonase nasal spray.

## 2017-08-01 ENCOUNTER — Telehealth: Payer: Self-pay | Admitting: *Deleted

## 2017-08-01 NOTE — Telephone Encounter (Signed)
PA completed via covermymeds for venlafaxine ER 75mg  BID. Not approved for 2 day dosing. Attempting quantity override. Will await determination.

## 2017-08-01 NOTE — Telephone Encounter (Signed)
PA approved. Pharmacy notified 

## 2017-08-03 DIAGNOSIS — R41841 Cognitive communication deficit: Secondary | ICD-10-CM | POA: Diagnosis not present

## 2017-08-06 DIAGNOSIS — R41841 Cognitive communication deficit: Secondary | ICD-10-CM | POA: Diagnosis not present

## 2017-08-07 DIAGNOSIS — R41841 Cognitive communication deficit: Secondary | ICD-10-CM | POA: Diagnosis not present

## 2017-09-03 DIAGNOSIS — R41841 Cognitive communication deficit: Secondary | ICD-10-CM | POA: Diagnosis not present

## 2017-09-10 DIAGNOSIS — R41841 Cognitive communication deficit: Secondary | ICD-10-CM | POA: Diagnosis not present

## 2017-09-13 DIAGNOSIS — R41841 Cognitive communication deficit: Secondary | ICD-10-CM | POA: Diagnosis not present

## 2017-09-14 DIAGNOSIS — R41841 Cognitive communication deficit: Secondary | ICD-10-CM | POA: Diagnosis not present

## 2017-09-25 ENCOUNTER — Other Ambulatory Visit: Payer: Self-pay

## 2017-09-25 DIAGNOSIS — G3184 Mild cognitive impairment, so stated: Secondary | ICD-10-CM

## 2017-09-25 DIAGNOSIS — I10 Essential (primary) hypertension: Secondary | ICD-10-CM

## 2017-09-25 DIAGNOSIS — E782 Mixed hyperlipidemia: Secondary | ICD-10-CM

## 2017-10-03 ENCOUNTER — Encounter: Attending: Oncology | Primary: Internal Medicine

## 2017-10-05 ENCOUNTER — Ambulatory Visit: Admit: 2017-10-05 | Discharge: 2017-10-05 | Payer: MEDICARE | Attending: Internal Medicine | Primary: Internal Medicine

## 2017-10-05 ENCOUNTER — Encounter

## 2017-10-05 DIAGNOSIS — I10 Essential (primary) hypertension: Secondary | ICD-10-CM

## 2017-10-05 DIAGNOSIS — Z23 Encounter for immunization: Secondary | ICD-10-CM | POA: Diagnosis not present

## 2017-10-05 DIAGNOSIS — R413 Other amnesia: Secondary | ICD-10-CM | POA: Diagnosis not present

## 2017-10-05 DIAGNOSIS — R69 Illness, unspecified: Secondary | ICD-10-CM | POA: Diagnosis not present

## 2017-10-05 MED ORDER — VENLAFAXINE HCL 75 MG PO TABS
75 MG | ORAL_TABLET | Freq: Two times a day (BID) | ORAL | 1 refills | Status: DC
Start: 2017-10-05 — End: 2017-12-06

## 2017-10-05 MED ORDER — AMLODIPINE BESYLATE 5 MG PO TABS
5 MG | ORAL_TABLET | Freq: Every day | ORAL | 1 refills | Status: DC
Start: 2017-10-05 — End: 2017-12-06

## 2017-10-05 NOTE — Progress Notes (Signed)
10/05/2017    Leslie Bradley (DOB:  14-Aug-1928) is a 84 y.o. female, here for evaluation of the following medical concerns:    Chief Complaint   Patient presents with   ??? New Patient        HPI    Here as a new patient. Patient's daughter accompanying her to the visit. Recently moved here from [place], to be closer to her daughter.     Hypertension - has been taking amlodipine 7.5 mg once daily. Denies chest pain, shortness of breath. Sometimes has swelling around the ankles, if she is up a lot.    Right hand pain - 5 days ago    Anxiety/depression - has been worse recently surrounding the move. She had also been having increased anxiety when she was in [place] - they suspect related to the memory issues.     Memory problems - has noted worsening memory difficulties. She notes difficulty with short and long term memory. Daughter reports that she had forgotten the name of her longtime dentist, and had gotten lost in her car. Memory issues worsened suddenly over the winter with an acute illness. She did see a neurologist in [place] and had an MRI. Her son who lives in [place] is a neurologist and accompanied her to that testing. Testing was normal. Apparently had mild carotid disease.    Review of Systems   Psychiatric/Behavioral: Positive for dysphoric mood. The patient is nervous/anxious.        Prior to Visit Medications    Medication Sig Taking? Authorizing Provider   FEXOFENADINE HCL PO Take by mouth Yes Historical Provider, MD   BIOTIN FORTE PO Take by mouth Yes Historical Provider, MD   Cyanocobalamin (B-12 PO) Take by mouth Yes Historical Provider, MD   amLODIPine (NORVASC) 5 MG tablet Take 1.5 tablets by mouth daily Yes Carolina Cellar, MD   venlafaxine (EFFEXOR) 75 MG tablet Take 1 tablet by mouth 2 times daily Yes Carolina Cellar, MD        Allergies   Allergen Reactions   ??? Amoxicillin    ??? Aspirin    ??? Diflucan [Fluconazole]    ??? Erythromycin        Past Medical History:   Diagnosis Date   ??? Breast cancer (HCC)    ???  Hypertension        Past Surgical History:   Procedure Laterality Date   ??? BREAST LUMPECTOMY         Social History     Socioeconomic History   ??? Marital status: Widowed     Spouse name: Not on file   ??? Number of children: Not on file   ??? Years of education: Not on file   ??? Highest education level: Not on file   Occupational History   ??? Not on file   Social Needs   ??? Financial resource strain: Not on file   ??? Food insecurity:     Worry: Not on file     Inability: Not on file   ??? Transportation needs:     Medical: Not on file     Non-medical: Not on file   Tobacco Use   ??? Smoking status: Never Smoker   ??? Smokeless tobacco: Never Used   Substance and Sexual Activity   ??? Alcohol use: Never     Frequency: Never   ??? Drug use: Never   ??? Sexual activity: Not Currently   Lifestyle   ??? Physical activity:  Days per week: Not on file     Minutes per session: Not on file   ??? Stress: Not on file   Relationships   ??? Social connections:     Talks on phone: Not on file     Gets together: Not on file     Attends religious service: Not on file     Active member of club or organization: Not on file     Attends meetings of clubs or organizations: Not on file     Relationship status: Not on file   ??? Intimate partner violence:     Fear of current or ex partner: Not on file     Emotionally abused: Not on file     Physically abused: Not on file     Forced sexual activity: Not on file   Other Topics Concern   ??? Not on file   Social History Narrative   ??? Not on file        No family history on file.    Vitals:    10/05/17 1302   BP: 100/60   Weight: 195 lb 3.2 oz (88.5 kg)   Height: 5\' 6"  (1.676 m)     Estimated body mass index is 31.51 kg/m?? as calculated from the following:    Height as of this encounter: 5\' 6"  (1.676 m).    Weight as of this encounter: 195 lb 3.2 oz (88.5 kg).    Physical Exam   Constitutional: She appears well-developed and well-nourished. No distress.   HENT:   Head: Normocephalic and atraumatic.   Cardiovascular:  Normal rate, regular rhythm and normal heart sounds.   Pulmonary/Chest: Effort normal and breath sounds normal. No respiratory distress.   Musculoskeletal: She exhibits edema (trace).   Neurological: She is alert.   Skin: Skin is warm and dry.   Psychiatric: She has a normal mood and affect. Her behavior is normal. Judgment and thought content normal.   Vitals reviewed.      ASSESSMENT/PLAN:  1. Essential hypertension  - BP on the low side today but not having lightheadedness  - continue amlodipine 7.5 mg daily  - Comprehensive Metabolic Panel; Future  - CBC Auto Differential; Future    2. Memory loss  - suspect some degree of dementia, has already had evaluation  - don't have labs available so will check blood work  - Comprehensive Metabolic Panel; Future  - TSH with Reflex; Future  - Vitamin B12; Future  - CBC Auto Differential; Future    3. Anxiety  - uncontrolled, in the setting of stress  - continue venlafaxine 75 mg BID  - refer to Dr. Adella Hare  - have a little room to go up on the effexor, currently she is right in the middle of the move,would reassess once she is settled then decide on dose change  - if considering medication change may consider psychiatry referral  - External Referral To Psychology    4. Major depressive disorder, remission status unspecified, unspecified whether recurrent  - worsened in the setting of stress, as above    5. Need for influenza vaccination  - INFLUENZA, TRIV, INACTIVATED, SUBUNIT, ADJUVANTED, 65 YRS AND OLDER, IM, PREFILL SYR, 0.5ML (FLUAD TRIV)      Return in about 2 months (around 12/05/2017) for anxiety.

## 2017-10-05 NOTE — Progress Notes (Signed)
Vaccine Information Sheet, "Influenza - Inactivated"  given to Leslie Bradley, or parent/legal guardian of  Leslie Bradley and verbalized understanding.    Patient responses:    Have you ever had a reaction to a flu vaccine? No  Are you able to eat eggs without adverse effects?  Yes  Do you have any current illness?  No  Have you ever had Guillian Barre Syndrome?  No    Flu vaccine given per order. Please see immunization tab.

## 2017-10-06 LAB — VITAMIN B12: Vitamin B-12: 799 pg/mL (ref 211–911)

## 2017-10-06 LAB — CBC WITH AUTO DIFFERENTIAL
Basophils %: 0.5 %
Basophils Absolute: 0 10*3/uL (ref 0.0–0.2)
Eosinophils %: 1.1 %
Eosinophils Absolute: 0.1 10*3/uL (ref 0.0–0.6)
Hematocrit: 39.3 % (ref 36.0–48.0)
Hemoglobin: 13 g/dL (ref 12.0–16.0)
Lymphocytes %: 24.7 %
Lymphocytes Absolute: 1.5 10*3/uL (ref 1.0–5.1)
MCH: 30.9 pg (ref 26.0–34.0)
MCHC: 33.1 g/dL (ref 31.0–36.0)
MCV: 93.3 fL (ref 80.0–100.0)
MPV: 9.2 fL (ref 5.0–10.5)
Monocytes %: 13.7 %
Monocytes Absolute: 0.8 10*3/uL (ref 0.0–1.3)
Neutrophils %: 60 %
Neutrophils Absolute: 3.5 10*3/uL (ref 1.7–7.7)
Platelets: 236 10*3/uL (ref 135–450)
RBC: 4.22 M/uL (ref 4.00–5.20)
RDW: 14.3 % (ref 12.4–15.4)
WBC: 5.9 10*3/uL (ref 4.0–11.0)

## 2017-10-06 LAB — COMPREHENSIVE METABOLIC PANEL
ALT: 22 U/L (ref 10–40)
AST: 43 U/L — ABNORMAL HIGH (ref 15–37)
Albumin/Globulin Ratio: 1.6 (ref 1.1–2.2)
Albumin: 4.4 g/dL (ref 3.4–5.0)
Alkaline Phosphatase: 91 U/L (ref 40–129)
Anion Gap: 14 (ref 3–16)
BUN: 17 mg/dL (ref 7–20)
CO2: 26 mmol/L (ref 21–32)
Calcium: 9.7 mg/dL (ref 8.3–10.6)
Chloride: 104 mmol/L (ref 99–110)
Creatinine: 0.8 mg/dL (ref 0.6–1.2)
GFR African American: >60 (ref >60)
GFR Non-African American: >60 (ref >60)
Globulin: 2.7 g/dL
Glucose: 97 mg/dL (ref 70–99)
Potassium: 4.5 mmol/L (ref 3.5–5.1)
Sodium: 144 mmol/L (ref 136–145)
Total Bilirubin: 0.4 mg/dL (ref 0.0–1.0)
Total Protein: 7.1 g/dL (ref 6.4–8.2)

## 2017-10-06 LAB — TSH WITH REFLEX: TSH: 3.03 u[IU]/mL (ref 0.27–4.20)

## 2017-10-22 ENCOUNTER — Ambulatory Visit: Admit: 2017-10-22 | Discharge: 2017-10-22 | Payer: MEDICARE | Attending: Internal Medicine | Primary: Internal Medicine

## 2017-10-22 ENCOUNTER — Encounter

## 2017-10-22 ENCOUNTER — Encounter: Attending: Psychologist | Primary: Internal Medicine

## 2017-10-22 DIAGNOSIS — R410 Disorientation, unspecified: Secondary | ICD-10-CM

## 2017-10-22 LAB — POCT URINALYSIS DIPSTICK
Bilirubin, UA: NEGATIVE
Glucose, UA POC: NEGATIVE
Ketones, UA: NEGATIVE
Nitrite, UA: NEGATIVE
Protein, UA POC: NEGATIVE
Spec Grav, UA: 1030
Urobilinogen, UA: 0.2
pH, UA: 6

## 2017-10-22 NOTE — Progress Notes (Signed)
10/22/2017     Leslie Bradley (DOB:  [birth year]) is a 84 y.o. female, here for evaluation of the following medical concerns:    Chief Complaint   Patient presents with   ??? Altered Mental Status     pt took a nap on yesterday and woke up very confused        HPI    Today her daughter came over to pick her up, patient woke up from a deep sleep and was initially very confused.  Confusion improved but did not resolve.  Patient seemed a bit dehydrated, so they encouraged her to drink water.  She seemed to improve a bit. Then when they took her home, she became more confused again, could not remember where they were going.  Today patient feels better though not 100% back to herself.  She has a difficult time describing her symptoms, feels like she does not know who people are though she has had no problem identifying her daughter.  She denies urinary frequency or dysuria.  No fevers or chills.  She felt a little unsteady this morning but no falls. They aren't sure whether she may have missed doses of her effexor. They don't think she took too much.    Review of Systems   Constitutional: Negative for chills and fever.   HENT: Negative for congestion.    Respiratory: Negative for shortness of breath.    Cardiovascular: Negative for chest pain.   Neurological: Negative for weakness and numbness.       Prior to Visit Medications    Medication Sig Taking? Authorizing Provider   FEXOFENADINE HCL PO Take by mouth Yes Historical Provider, MD   BIOTIN FORTE PO Take by mouth Yes Historical Provider, MD   Cyanocobalamin (B-12 PO) Take by mouth Yes Historical Provider, MD   amLODIPine (NORVASC) 5 MG tablet Take 1.5 tablets by mouth daily Yes Carolina CellarMegan G Leslieann Whisman, MD   venlafaxine (EFFEXOR) 75 MG tablet Take 1 tablet by mouth 2 times daily Yes Carolina CellarMegan G Antoni Stefan, MD        Past Medical History:   Diagnosis Date   ??? Breast cancer (HCC)    ??? Hypertension        Past Surgical History:   Procedure Laterality Date   ??? BREAST LUMPECTOMY         Social  History     Tobacco Use   ??? Smoking status: Never Smoker   ??? Smokeless tobacco: Never Used   Substance Use Topics   ??? Alcohol use: Never     Frequency: Never        No family history on file.    Vitals:    10/22/17 1326 10/22/17 1332   BP: (!) 154/60 (!) 154/60   Weight: 197 lb (89.4 kg)    Height: 5\' 6"  (1.676 m)      Estimated body mass index is 31.8 kg/m?? as calculated from the following:    Height as of this encounter: 5\' 6"  (1.676 m).    Weight as of this encounter: 197 lb (89.4 kg).    Physical Exam   Constitutional: She appears well-developed and well-nourished. No distress.   HENT:   Head: Normocephalic and atraumatic.   Eyes: Pupils are equal, round, and reactive to light. EOM are normal.   Cardiovascular: Normal rate, regular rhythm and normal heart sounds.   Pulmonary/Chest: Effort normal and breath sounds normal. No respiratory distress.   Musculoskeletal: She exhibits no edema.   Neurological: She is  alert. No cranial nerve deficit.   Skin: Skin is warm and dry.   Psychiatric: She has a normal mood and affect. Her behavior is normal. Judgment and thought content normal.   Vitals reviewed.      ASSESSMENT/PLAN:  1. Confusion  - appears to be resolving. She had no obvious neurological deficits and was able to recognize some confusion. She recently have blood work which was unremarkable. Will check for UTI, and check BMP. Dehydration could have been the cause. Also consider possibility of medication error, particularly with the effexor.  - Basic Metabolic Panel; Future  - CBC Auto Differential; Future  - Urinalysis with Microscopic  - Urine Culture  - POCT Urinalysis no Micro      Return if symptoms worsen or fail to improve.

## 2017-10-22 NOTE — Telephone Encounter (Signed)
Pt's Guardian Clydie BraunKaren states pt has been confused lately and did not know if Dr.Lockey would still like to see her for her scheduled appt today at 11:30 or should she cancel the appt due to the pt not being in the right mental state.

## 2017-10-23 LAB — URINALYSIS WITH MICROSCOPIC
Bilirubin Urine: NEGATIVE
Blood, Urine: NEGATIVE
Epithelial Cells, UA: 0 /HPF (ref 0–5)
Glucose, Ur: NEGATIVE mg/dL
Hyaline Casts, UA: 0 /LPF (ref 0–8)
Ketones, Urine: NEGATIVE mg/dL
Leukocyte Esterase, Urine: NEGATIVE
Nitrite, Urine: NEGATIVE
Protein, UA: NEGATIVE mg/dL
RBC, UA: 1 /HPF (ref 0–4)
Specific Gravity, UA: 1.013 (ref 1.005–1.030)
Urobilinogen, Urine: 0.2 E.U./dL (ref <2.0)
WBC, UA: 1 /HPF (ref 0–5)
pH, UA: 6.5 (ref 5.0–8.0)

## 2017-10-23 LAB — BASIC METABOLIC PANEL
Anion Gap: 12 (ref 3–16)
BUN: 19 mg/dL (ref 7–20)
CO2: 29 mmol/L (ref 21–32)
Calcium: 9.2 mg/dL (ref 8.3–10.6)
Chloride: 98 mmol/L — ABNORMAL LOW (ref 99–110)
Creatinine: 0.7 mg/dL (ref 0.6–1.2)
GFR African American: >60 (ref >60)
GFR Non-African American: >60 (ref >60)
Glucose: 79 mg/dL (ref 70–99)
Potassium: 4.1 mmol/L (ref 3.5–5.1)
Sodium: 139 mmol/L (ref 136–145)

## 2017-10-23 LAB — CBC WITH AUTO DIFFERENTIAL
Basophils %: 0.3 %
Basophils Absolute: 0 10*3/uL (ref 0.0–0.2)
Eosinophils %: 0.4 %
Eosinophils Absolute: 0 10*3/uL (ref 0.0–0.6)
Hematocrit: 38.2 % (ref 36.0–48.0)
Hemoglobin: 12.6 g/dL (ref 12.0–16.0)
Lymphocytes %: 17.7 %
Lymphocytes Absolute: 1.4 10*3/uL (ref 1.0–5.1)
MCH: 30.8 pg (ref 26.0–34.0)
MCHC: 33 g/dL (ref 31.0–36.0)
MCV: 93.1 fL (ref 80.0–100.0)
MPV: 9.4 fL (ref 5.0–10.5)
Monocytes %: 10.7 %
Monocytes Absolute: 0.8 10*3/uL (ref 0.0–1.3)
Neutrophils %: 70.9 %
Neutrophils Absolute: 5.6 10*3/uL (ref 1.7–7.7)
Platelets: 248 10*3/uL (ref 135–450)
RBC: 4.11 M/uL (ref 4.00–5.20)
RDW: 14.3 % (ref 12.4–15.4)
WBC: 7.9 10*3/uL (ref 4.0–11.0)

## 2017-10-24 LAB — CULTURE, URINE: Urine Culture, Routine: NO GROWTH

## 2017-10-31 ENCOUNTER — Telehealth: Payer: Self-pay | Admitting: Family Medicine

## 2017-10-31 NOTE — Telephone Encounter (Signed)
Spoke with Terrilyn Saver at [facility] regarding patient's appointment that was scheduled on 11/15/17 and was advised that patient moved to [place] with daughter.

## 2017-11-07 ENCOUNTER — Ambulatory Visit: Payer: Medicare HMO | Admitting: Neurology

## 2017-11-14 ENCOUNTER — Encounter: Payer: Self-pay | Admitting: Internal Medicine

## 2017-11-15 ENCOUNTER — Encounter: Payer: Medicare HMO | Admitting: Family

## 2017-12-06 ENCOUNTER — Ambulatory Visit: Admit: 2017-12-06 | Discharge: 2017-12-06 | Payer: MEDICARE | Attending: Internal Medicine | Primary: Internal Medicine

## 2017-12-06 DIAGNOSIS — F419 Anxiety disorder, unspecified: Secondary | ICD-10-CM

## 2017-12-06 MED ORDER — AMLODIPINE BESYLATE 5 MG PO TABS
5 MG | ORAL_TABLET | Freq: Every day | ORAL | 1 refills | Status: DC
Start: 2017-12-06 — End: 2018-04-11

## 2017-12-06 MED ORDER — VENLAFAXINE HCL 100 MG PO TABS
100 MG | ORAL_TABLET | Freq: Two times a day (BID) | ORAL | 2 refills | Status: DC
Start: 2017-12-06 — End: 2017-12-31

## 2017-12-06 MED ORDER — AMLODIPINE BESYLATE 2.5 MG PO TABS
2.5 MG | ORAL_TABLET | Freq: Every day | ORAL | 1 refills | Status: DC
Start: 2017-12-06 — End: 2018-03-20

## 2017-12-06 NOTE — Progress Notes (Signed)
12/06/2017     Leslie Bradley (DOB:  [birth year]) is a 84 y.o. female, here for evaluation of the following medical concerns:    Chief Complaint   Patient presents with   ??? Anxiety     pt stated that she has not been feeling to well, kinda down        HPI    Anxiety -has been feeling a little down recently.  Still some and doing a lot of activities at her new living facility.  Adherent to medication.    Hypertension -no chest pain or shortness of breath.  Taking the amlodipine.  No leg swelling.    Review of Systems   Psychiatric/Behavioral: The patient is nervous/anxious.        Prior to Visit Medications    Medication Sig Taking? Authorizing Provider   Multiple Vitamins-Minerals (MULTIVITAMIN ADULT PO) Take by mouth Yes Historical Provider, MD   amLODIPine (NORVASC) 5 MG tablet Take 1 tablet by mouth daily With 2.5 mg for 7.5mg  total daily Yes Carolina Cellar, MD   amLODIPine (NORVASC) 2.5 MG tablet Take 1 tablet by mouth daily With 5mg  tablet for 7.5 mg total daily Yes Carolina Cellar, MD   FEXOFENADINE HCL PO Take by mouth Yes Historical Provider, MD   venlafaxine (EFFEXOR) 100 MG tablet TAKE 1 TABLET BY MOUTH TWICE A DAY  Carolina Cellar, MD   BIOTIN FORTE PO Take by mouth  Historical Provider, MD   Cyanocobalamin (B-12 PO) Take by mouth  Historical Provider, MD        Past Medical History:   Diagnosis Date   ??? Breast cancer (HCC)    ??? Hypertension        Past Surgical History:   Procedure Laterality Date   ??? BREAST LUMPECTOMY         Social History     Tobacco Use   ??? Smoking status: Never Smoker   ??? Smokeless tobacco: Never Used   Substance Use Topics   ??? Alcohol use: Never     Frequency: Never        No family history on file.    Vitals:    12/06/17 1432 12/06/17 1438   BP: (!) 140/50 (!) 140/50   Weight: 194 lb 12.8 oz (88.4 kg)    Height: 5\' 6"  (1.676 m)      Estimated body mass index is 31.44 kg/m?? as calculated from the following:    Height as of this encounter: 5\' 6"  (1.676 m).    Weight as of this encounter:  194 lb 12.8 oz (88.4 kg).    Physical Exam  Vitals signs reviewed.   Constitutional:       General: She is not in acute distress.     Appearance: She is well-developed.   HENT:      Head: Normocephalic and atraumatic.   Cardiovascular:      Rate and Rhythm: Normal rate and regular rhythm.      Heart sounds: Normal heart sounds.   Pulmonary:      Effort: Pulmonary effort is normal. No respiratory distress.      Breath sounds: Normal breath sounds.   Skin:     General: Skin is warm and dry.   Neurological:      Mental Status: She is alert.   Psychiatric:         Behavior: Behavior normal.         Thought Content: Thought content normal.  Cognition and Memory: Memory is impaired.         Judgment: Judgment normal.         ASSESSMENT/PLAN:  1. Anxiety  -Still having some symptoms.  They working on increasing socialization at her current facility, we will keep the medication dose the same for right now and see if her mood improves with more activities  -Continue venlafaxine 100 mg twice daily    2. Essential hypertension  -Borderline control today  -Continue amlodipine 7.5 mg once daily, monitor      Return in about 2 months (around 02/05/2018).

## 2017-12-24 ENCOUNTER — Encounter: Attending: Psychologist | Primary: Internal Medicine

## 2018-01-01 MED ORDER — VENLAFAXINE HCL 100 MG PO TABS
100 MG | ORAL_TABLET | ORAL | 2 refills | Status: DC
Start: 2018-01-01 — End: 2018-04-02

## 2018-01-08 DIAGNOSIS — F419 Anxiety disorder, unspecified: Secondary | ICD-10-CM

## 2018-01-15 ENCOUNTER — Ambulatory Visit: Admit: 2018-01-15 | Discharge: 2018-01-15 | Payer: MEDICARE | Attending: Psychologist | Primary: Internal Medicine

## 2018-01-15 DIAGNOSIS — F4323 Adjustment disorder with mixed anxiety and depressed mood: Secondary | ICD-10-CM

## 2018-01-15 NOTE — Progress Notes (Signed)
Behavioral Health Consultation  Leslie Bradley [place], PsyD  Psychologist  01/15/2018   10:49 AM       Time spent with Patient: 30 minutes  This is patient's first [facility] appointment.    Reason for Consult:  Depression, anxiety  Referring Provider: Carolina Cellar, MD     Pt provided informed consent for the behavioral health program. Discussed with patient model of service to include the limits of confidentiality (i.e. abuse reporting, suicide intervention, etc.) and short-term intervention focused approach.  Pt indicated understanding.  Feedback given to PCP.    S:  Pt reports a lot has piled up in her life. Has been thinking about seeing someone for awhile. Never been to therapy.  Husband passed away 15 years ago. Misses him.    Moved here a few months ago. Moved to be closer to daughter. Was in [place]. Misses her friends.Was there for 25 years. Was in retirement facility last 2 years and made some great friends. Living at [facility] now - going ok. Had health issues beginning of the year. Son says she had a stroke but her doctor says no. Had pneumonia.  Had a car in [place] and now parked at her daughter's. Isn't driving now so lost some independence. Can drive but doesn't know the area.  Likes to read, socialize. 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    Feels things just aren't going right and feels some resentmenl  Denies thoughts of SI.    O:  MSE:    Appearance: good hygiene   Attitude: cooperative and friendly  Consciousness: alert  Orientation: oriented to person, place, time, general circumstance  Memory: impaired  Attention/Concentration: lost train of thought while speaking  Psychomotor Activity:normal  Eye Contact: normal  Speech: normal rate and volume, well-articulated  Mood: "ok"  Affect: euthymic and congruent  Perception: within normal limits  Thought Content: within normal limits  Thought Process: logical, coherent and goal-directed  Insight: fair  Judgment: intact  Ability to understand instructions:  Yes  Ability to respond meaningfully: Yes  Morbid Ideation: no   Suicide Assessment: no suicidal ideation, plan, or intent  Homicidal Ideation: no      History:    Medications:   Current Outpatient Medications   Medication Sig Dispense Refill   ??? venlafaxine (EFFEXOR) 100 MG tablet TAKE 1 TABLET BY MOUTH TWICE A DAY 60 tablet 2   ??? Multiple Vitamins-Minerals (MULTIVITAMIN ADULT PO) Take by mouth     ??? amLODIPine (NORVASC) 5 MG tablet Take 1 tablet by mouth daily With 2.5 mg for 7.5mg  total daily 90 tablet 1   ??? amLODIPine (NORVASC) 2.5 MG tablet Take 1 tablet by mouth daily With 5mg  tablet for 7.5 mg total daily 90 tablet 1   ??? FEXOFENADINE HCL PO Take by mouth     ??? BIOTIN FORTE PO Take by mouth     ??? Cyanocobalamin (B-12 PO) Take by mouth       No current facility-administered medications for this visit.        Social History:   Social History     Socioeconomic History   ??? Marital status: Widowed     Spouse name: Not on file   ??? Number of children: Not on file   ??? Years of education: Not on file   ??? Highest education level: Not on file   Occupational History   ??? Not on file   Social Needs   ??? Financial resource strain: Not  on file   ??? Food insecurity:     Worry: Not on file     Inability: Not on file   ??? Transportation needs:     Medical: Not on file     Non-medical: Not on file   Tobacco Use   ??? Smoking status: Never Smoker   ??? Smokeless tobacco: Never Used   Substance and Sexual Activity   ??? Alcohol use: Never     Frequency: Never   ??? Drug use: Never   ??? Sexual activity: Not Currently   Lifestyle   ??? Physical activity:     Days per week: Not on file     Minutes per session: Not on file   ??? Stress: Not on file   Relationships   ??? Social connections:     Talks on phone: Not on file     Gets together: Not on file     Attends religious service: Not on file     Active member of club or organization: Not on file     Attends meetings of clubs or organizations: Not on file     Relationship status: Not on file   ???  Intimate partner violence:     Fear of current or ex partner: Not on file     Emotionally abused: Not on file     Physically abused: Not on file     Forced sexual activity: Not on file   Other Topics Concern   ??? Not on file   Social History Narrative   ??? Not on file       TOBACCO:   reports that she has never smoked. She has never used smokeless tobacco.  ETOH:   reports no history of alcohol use.    Family History:   No family history on file.      A:  Ms. Leslie Bradley has been experiencing depressed and anxious mood secondary to significant life changes, including a move from [place]. She was talkative and engaged. She responded positively to behavioral interventions.    GAD 7 SCORE 01/15/2018   GAD-7 Total Score 3     Interpretation of GAD-7 score: 5-9 = mild anxiety, 10-14 = moderate anxiety, 15+ = severe anxiety. Recommend referral to behavioral health for scores 10 or greater.    PHQ Scores 01/15/2018 10/22/2017 10/05/2017   PHQ2 Score 3 0 0   PHQ9 Score 4 0 0     Interpretation of Total Score Depression Severity: 1-4 = Minimal depression, 5-9 = Mild depression, 10-14 = Moderate depression, 15-19 = Moderately severe depression, 20-27 = Severe depression      Diagnosis:    1. Adjustment disorder with mixed anxiety and depressed mood           Plan:  Pt interventions:    Established rapport, Discussed BHC model of care vs specialty mental health, Conducted functional assessment, Agenda-setting to identify [facility], Supportive techniques, reinforced pt's skill use, behavioral activation interventions, ACT interventions and treatment planning    Pt Behavioral Change Plan:  Pt set goals to 1) consider driving short distances to work on getting to know the area 2) Return in about 3 weeks (around 02/05/2018).

## 2018-01-19 ENCOUNTER — Emergency Department: Admit: 2018-01-19 | Payer: MEDICARE | Primary: Internal Medicine

## 2018-01-19 ENCOUNTER — Inpatient Hospital Stay
Admit: 2018-01-19 | Discharge: 2018-01-20 | Disposition: A | Discharge status: Home / Self Care | Payer: MEDICARE | Attending: Emergency Medicine

## 2018-01-19 DIAGNOSIS — S0012XA Contusion of left eyelid and periocular area, initial encounter: Secondary | ICD-10-CM

## 2018-01-19 MED ORDER — ACETAMINOPHEN 325 MG PO TABS
325 MG | Freq: Once | ORAL | Status: AC
Start: 2018-01-19 — End: 2018-01-19
  Administered 2018-01-19: 23:00:00 650 mg via ORAL

## 2018-01-19 MED FILL — ACETAMINOPHEN 325 MG PO TABS: 325 mg | ORAL | Qty: 2

## 2018-01-19 NOTE — ED Triage Notes (Signed)
Pt states she was walking into the garage when her toe got stuck and she tripped.  Pt complaining of left facial pain, as well as pain in her left hip and knee.

## 2018-01-19 NOTE — ED Provider Notes (Signed)
THE [facility]  EMERGENCY DEPARTMENT ENCOUNTER          EM RESIDENT NOTE       Date of evaluation: 01/19/2018    Chief Complaint     Fall      History of Present Illness     Leslie Bradley is a 84 y.o. female with history of HTN who presents after a mechanical fall complaining of bruising/trauma to hurdle left eye area and a scrape on her left knee.  Patient was at her daughter's house and tripped over a raised portion of her daughters garage, falling forward and striking her head against the bumper of the car.  She then landed on the ground and required assistance up.  Patient remembers the entire event and states that she did not lose consciousness.  She had no preceding symptoms and this was purely a mechanical fall.  She currently complains of pain around her left eye and her nose, where she struck the car.  She has a small abrasion to the lateral portion of her orbital rim and bruising around her eye.  She has pain around an abrasion of her left knee but is able to move the knee without significant difficulty.  She has been able to walk since the injury.  She also complains of pain in her lateral left hip.  She is not taking anything for symptoms.  She is not on blood thinners.  No history of frequent falls.    Review of Systems     Review of Systems   Constitutional: Negative for chills and fever.   HENT: Negative for hearing loss, sinus pain and trouble swallowing.    Eyes: Negative for photophobia, pain and visual disturbance.   Respiratory: Negative for cough and shortness of breath.    Cardiovascular: Negative for chest pain and leg swelling.   Gastrointestinal: Negative for abdominal pain, nausea and vomiting.   Genitourinary: Negative for difficulty urinating and dysuria.   Musculoskeletal: Negative for back pain, gait problem, joint swelling and neck pain.   Neurological: Positive for headaches. Negative for dizziness, syncope, weakness and light-headedness.       Past Medical, Surgical,  Family, and Social History     She has a past medical history of Breast cancer (HCC) and Hypertension.  She has a past surgical history that includes Breast lumpectomy.  Her family history is not on file.  She reports that she has never smoked. She has never used smokeless tobacco. She reports that she does not drink alcohol or use drugs.    Medications     Previous Medications    AMLODIPINE (NORVASC) 2.5 MG TABLET    Take 1 tablet by mouth daily With 5mg  tablet for 7.5 mg total daily    AMLODIPINE (NORVASC) 5 MG TABLET    Take 1 tablet by mouth daily With 2.5 mg for 7.5mg  total daily    BIOTIN FORTE PO    Take by mouth    CYANOCOBALAMIN (B-12 PO)    Take by mouth    FEXOFENADINE HCL PO    Take by mouth    MULTIPLE VITAMINS-MINERALS (MULTIVITAMIN ADULT PO)    Take by mouth    VENLAFAXINE (EFFEXOR) 100 MG TABLET    TAKE 1 TABLET BY MOUTH TWICE A DAY       Allergies     She is allergic to amoxicillin; aspirin; diflucan [fluconazole]; and erythromycin.    Physical Exam     INITIAL VITALS: BP: (!) 162/83, Temp:  98 ??F (36.7 ??C), Pulse: 98, Resp: 17, SpO2: 95 %   Physical Exam  Constitutional:       Appearance: Normal appearance.   HENT:      Head:      Comments: Periorbital ecchymosis of the left eye.  Swelling over the nasal bridge.  No deformity of the nose.  Superficial abrasion on the lateral orbital rim of the eye.     Nose:      Comments: No deformity, swelling noted.     Mouth/Throat:      Mouth: Mucous membranes are moist.      Pharynx: Oropharynx is clear.   Eyes:      Extraocular Movements: Extraocular movements intact.      Conjunctiva/sclera: Conjunctivae normal.      Pupils: Pupils are equal, round, and reactive to light.   Neck:      Musculoskeletal: Normal range of motion and neck supple. No muscular tenderness.   Cardiovascular:      Rate and Rhythm: Normal rate and regular rhythm.      Pulses: Normal pulses.      Heart sounds: Normal heart sounds.   Pulmonary:      Effort: Pulmonary effort is normal. No  respiratory distress.      Breath sounds: Normal breath sounds.   Abdominal:      General: Abdomen is flat. Bowel sounds are normal. There is no distension.      Palpations: Abdomen is soft.      Tenderness: There is no tenderness.   Musculoskeletal:      Comments: No upper extremity bony tenderness with full range of motion.  Pelvic stable to rock.  Tenderness to palpation of the left greater trochanter however has full range of motion both actively and passively at the hip without significant pain.  Superficial abrasion on the anterior left knee without swelling, deformity and normal range of motion of the left knee.  Tenderness palpation of the distal tibia near the ankle.  Full range of motion at the ankle.  Bilateral lower extremities are neurovascularly intact.   Neurological:      General: No focal deficit present.      Mental Status: She is alert and oriented to person, place, and time. Mental status is at baseline.      Cranial Nerves: No cranial nerve deficit.      Sensory: No sensory deficit.      Gait: Gait normal.         DiagnosticResults         RADIOLOGY:  XR CHEST STANDARD (2 VW)   Final Result      1. No findings for acute cardiopulmonary disease.         CT Head WO Contrast   Final Result      1.  No findings for acute intracranial abnormality.      2.  Age-related atrophy with patchy periventricular white matter changes consistent with chronic small vessel ischemia.      3.  Smaller fluid level in the right maxillary sinus.               CT CERVICAL SPINE WITHOUT CONTRAST      HISTORY: Fall      FINDINGS: Thin section axial images obtained through the cervical spine.  Multiplanar reconstruction images received for interpretation.  Low radiation dose CT technique utilized.      Cervical vertebral body height is intact.  There is grade 1 anterolisthesis of C3 3  on C4 likely degenerative.  Mild hypertrophic and plate spurring at C5-C6 and C6-C7.  Posterior malleolus and facets are maintained.   Odontoid and atlantoaxial joints    appear intact.  Prevertebral soft tissues appear within normal limits.      Thin section axial images obtained through the cervical spine.  Skull base appears within normal limits.  The arch of C1 is intact.  Multilevel facet arthropathy greatest rightward.  No findings for acute fracture or subluxation.  Multilevel neural    foraminal narrowing greatest rightward throughout the cervical spine.      Lung apices are clear.  Paravertebral soft tissues are unremarkable.      IMPRESSION:      1.  No findings for acute traumatic cervical spine abnormality.      2.  Multilevel degenerative disc disease with mild endplate spurring multilevel facet arthropathy.  These findings contribute to neural foraminal narrowing at multiple levels.      CT Cervical Spine WO Contrast   Final Result      1.  No findings for acute intracranial abnormality.      2.  Age-related atrophy with patchy periventricular white matter changes consistent with chronic small vessel ischemia.      3.  Smaller fluid level in the right maxillary sinus.               CT CERVICAL SPINE WITHOUT CONTRAST      HISTORY: Fall      FINDINGS: Thin section axial images obtained through the cervical spine.  Multiplanar reconstruction images received for interpretation.  Low radiation dose CT technique utilized.      Cervical vertebral body height is intact.  There is grade 1 anterolisthesis of C3 3 on C4 likely degenerative.  Mild hypertrophic and plate spurring at C5-C6 and C6-C7.  Posterior malleolus and facets are maintained.  Odontoid and atlantoaxial joints    appear intact.  Prevertebral soft tissues appear within normal limits.      Thin section axial images obtained through the cervical spine.  Skull base appears within normal limits.  The arch of C1 is intact.  Multilevel facet arthropathy greatest rightward.  No findings for acute fracture or subluxation.  Multilevel neural    foraminal narrowing greatest rightward  throughout the cervical spine.      Lung apices are clear.  Paravertebral soft tissues are unremarkable.      IMPRESSION:      1.  No findings for acute traumatic cervical spine abnormality.      2.  Multilevel degenerative disc disease with mild endplate spurring multilevel facet arthropathy.  These findings contribute to neural foraminal narrowing at multiple levels.      XR ANKLE LEFT (MIN 3 VIEWS)   Final Result      1.  No findings for acute traumatic bony abnormality within the left hip.                  3 VIEWS LEFT ANKLE      HISTORY: Fall with pain      FINDINGS: There is no discrete fracture or dislocation.  Mortise is intact.  Visualized bones and midfoot are unremarkable.  Small plantar calcaneal spur identified.  No soft tissue abnormality seen.  No apical body identified.      IMPRESSION:      1.  No findings for acute traumatic bony abnormality within the left ankle.      XR HIP LEFT (2-3 VIEWS)  Final Result      1.  No findings for acute traumatic bony abnormality within the left hip.                  3 VIEWS LEFT ANKLE      HISTORY: Fall with pain      FINDINGS: There is no discrete fracture or dislocation.  Mortise is intact.  Visualized bones and midfoot are unremarkable.  Small plantar calcaneal spur identified.  No soft tissue abnormality seen.  No apical body identified.      IMPRESSION:      1.  No findings for acute traumatic bony abnormality within the left ankle.          LABS:   No results found for this visit on 01/19/18.    ED BEDSIDE ULTRASOUND:      RECENT VITALS:  BP: (!) 162/83, Temp: 98 ??F (36.7 ??C), Pulse: 98,Resp: 17, SpO2: 95 %     Procedures         ED Course     Nursing Notes, Past Medical Hx, Past Surgical Hx, Social Hx, Allergies, and Family Hx were reviewed.    The patient was given the followingmedications:  Orders Placed This Encounter   Medications   ??? acetaminophen (TYLENOL) tablet 650 mg       CONSULTS:  None    MEDICAL DECISION MAKING / ASSESSMENT / PLAN     Leslie NiemannMargaret  Bradley is a 84 y.o. female 84 year old female with history of hypertension who presents after mechanical fall with signs of facial trauma, hip pain and left knee abrasion no preceding symptoms, do not think this was syncopal.  She had no loss of consciousness afterwards.  On my exam, she has normal vital signs other than being slightly hypertensive.  She is well-appearing and acting at baseline per her daughter, who accompanies her today.  She has ecchymosis around her left eye with a superficial abrasion to the lateral portion of the orbital rim.  Her nose has some swelling but no deformity.  She has some pain to palpation over the left greater trochanter and pain of the distal tibia near the ankle.  She has been able to ambulate since the injury.  Due to her age and mechanism, will obtain CT scans of her head and neck.  We discussed the utility of a CT scan of her face to look for nasal bone fractures, however as she is not likely an operative candidate and fixing this would only be cosmetic, we agreed that this would be unnecessary.  They can pursue this as an outpatient if they deem necessary.  We will also obtain plain films of the left hip and left ankle due to her pain.  At this appears mechanical, do not think she requires further lab work.    CT head and cervical spine unremarkable.  X-rays of the left hip and left ankle without fracture.  Patient was given Tylenol for pain.  Her  abrasion to the left orbital rim was repaired with Dermabond and Steri-Strips.  On reassessment, patient had some left upper rib pain with movements, so chest x-ray was added and was unremarkable.  Likely that she does have bruised ribs.    Patient will follow-up with her primary care doctor within 1 week.  We talked about the signs and symptoms of concussion and need for repeat evaluation in the emergency room, including worsening headache, behavior changes or other concerns.    This patient was also  evaluated by the attending  physician. All care plans werediscussed and agreed upon.    Clinical Impression     1. Injury of head, initial encounter    2. Multiple contusions        Disposition     PATIENT REFERRED TO:  Carolina CellarMegan G Cox, MD  [address]  [place] [id number]  [phone]    Schedule an appointment as soon as possible for a visit in 1 week        DISCHARGE MEDICATIONS:  New Prescriptions    No medications on file       DISPOSITION        Ronita Hippsaniel Graycee Greeson, MD  Resident  01/19/18 [phone]

## 2018-01-19 NOTE — Discharge Instructions (Addendum)
-   The CT scans of your head and neck were normal  - The x-rays of your chest, hip and leg were normal  -Please watch for symptoms of a concussion as we discussed.  Please see your doctor in 1 week for reevaluation.  -Return to the emergency room with any worsening concerns

## 2018-01-19 NOTE — ED Provider Notes (Signed)
ED Attending Attestation Note     Date of evaluation: 01/19/2018    This patient was seen by the resident.  I have seen and examined the patient, agree with the workup, evaluation, management and diagnosis. The care plan has been discussed.  My assessment reveals alert female who tripped in the garage on uneven concrete he has pain over her left superior orbit no crepitance extraocular movements are intact.  Not on anticoagulants     Leighton RoachJoseph J Jolly Bleicher, MD  01/19/18 [phone]

## 2018-02-01 ENCOUNTER — Ambulatory Visit: Admit: 2018-02-01 | Discharge: 2018-02-01 | Payer: MEDICARE | Attending: Internal Medicine | Primary: Internal Medicine

## 2018-02-01 DIAGNOSIS — M549 Dorsalgia, unspecified: Secondary | ICD-10-CM

## 2018-02-01 MED ORDER — TIZANIDINE HCL 2 MG PO TABS
2 MG | ORAL_TABLET | Freq: Three times a day (TID) | ORAL | 0 refills | Status: DC | PRN
Start: 2018-02-01 — End: 2018-03-20

## 2018-02-01 NOTE — Progress Notes (Signed)
02/01/2018     Leslie Bradley (DOB:  [birth year]) is a 85 y.o. female, here for evaluation of the following medical concerns:    Chief Complaint   Patient presents with   ??? Check-Up     pt had a fall x 1 week ago and experiencing pain in her back area.   ??? Constipation     off/on small amount of bright blood noticed        HPI    Back pain - started about a week ago.  She had a fall prior to that, did not fall on her back but hit her face and had an ED visit.  Back pain started with shoulder, on New Year's pain was located in the lower back, now more mid-back. Pain occurs with movement. Taking ibuprofen, used heat.  Heat did seem to help.  Her daughter thinks she may have strained or twisted during the fall which may have triggered the back pain.  Denies leg pain, leg weakness, radiating back pain, numbness or tingling.  She tripped over the threshold of a garage and fell against a car.    Anxiety, depression -daughter thinks that the symptoms are unchanged.  Patient states that she does not feel too bothered by depression.  She does still miss her old friends but has been calling them.  Daughter does not think that the higher dose of Effexor has done anything.  Patient reports that she has tried Zoloft and Celexa.  She is not sure about any other medications.    Noticed a small amount of blood when wiping recently.  Does not recall having hemorrhoids in the past but she did feel a couple of hemorrhoids.  She was dealing with some constipation which has improved.  Bleeding is resolved.  Denies pain or itching.    Review of Systems   Musculoskeletal: Positive for back pain.   Psychiatric/Behavioral: Positive for dysphoric mood.       Prior to Visit Medications    Medication Sig Taking? Authorizing Provider   tiZANidine (ZANAFLEX) 2 MG tablet Take 1 tablet by mouth every 8 hours as needed (muscle spasm) Yes Carolina Cellar, MD   venlafaxine (EFFEXOR) 100 MG tablet TAKE 1 TABLET BY MOUTH TWICE A DAY Yes Carolina Cellar, MD    Multiple Vitamins-Minerals (MULTIVITAMIN ADULT PO) Take by mouth Yes Historical Provider, MD   amLODIPine (NORVASC) 5 MG tablet Take 1 tablet by mouth daily With 2.5 mg for 7.5mg  total daily Yes Carolina Cellar, MD   amLODIPine (NORVASC) 2.5 MG tablet Take 1 tablet by mouth daily With 5mg  tablet for 7.5 mg total daily Yes Carolina Cellar, MD   FEXOFENADINE HCL PO Take by mouth Yes Historical Provider, MD   BIOTIN FORTE PO Take by mouth Yes Historical Provider, MD   Cyanocobalamin (B-12 PO) Take by mouth Yes Historical Provider, MD        Past Medical History:   Diagnosis Date   ??? Breast cancer (HCC)    ??? Hypertension        Past Surgical History:   Procedure Laterality Date   ??? BREAST LUMPECTOMY         Social History     Tobacco Use   ??? Smoking status: Never Smoker   ??? Smokeless tobacco: Never Used   Substance Use Topics   ??? Alcohol use: Never     Frequency: Never        No family history on file.  Vitals:    02/01/18 1314 02/01/18 1318   BP: (!) 150/60 (!) 150/60   Weight: 195 lb 12.8 oz (88.8 kg)    Height: 5\' 6"  (1.676 m)      Estimated body mass index is 31.6 kg/m?? as calculated from the following:    Height as of this encounter: 5\' 6"  (1.676 m).    Weight as of this encounter: 195 lb 12.8 oz (88.8 kg).    Physical Exam  Vitals signs reviewed.   Constitutional:       General: She is not in acute distress.     Appearance: She is well-developed.   HENT:      Head: Normocephalic and atraumatic.   Cardiovascular:      Rate and Rhythm: Normal rate and regular rhythm.      Heart sounds: Normal heart sounds.   Pulmonary:      Effort: Pulmonary effort is normal. No respiratory distress.      Breath sounds: Normal breath sounds.   Musculoskeletal:        Arms:       Comments: No bruising or deformity   Skin:     General: Skin is warm and dry.   Neurological:      Mental Status: She is alert. Mental status is at baseline.   Psychiatric:         Behavior: Behavior normal.         Thought Content: Thought content normal.          Judgment: Judgment normal.         ASSESSMENT/PLAN:  1. Acute back pain, unspecified back location, unspecified back pain laterality  - consistent with muscular back pain. Likely having some muscle spasm after fall.   - tizanidine, can also use acetaminophen, continue heat    2. Anxiety  -.  Discussed whether to try switching medications, she would have to titrate down significantly on the venlafaxine before we would be able to switch to another agent.  I am not certain how much benefit she would get from switching since the biggest issue seems to be that she misses her friends from home, and changing medications would not be likely to change that.  -At this point anxiety and depression is reasonably controlled, will monitor for right now and reassess at next appointment.    3. Hemorrhoids, unspecified hemorrhoid type  - no bleeding currently, asymptomatic, avoid constipation      Return in about 3 months (around 05/03/2018).

## 2018-02-04 ENCOUNTER — Ambulatory Visit: Admit: 2018-02-04 | Discharge: 2018-02-04 | Payer: MEDICARE | Attending: Psychologist | Primary: Internal Medicine

## 2018-02-04 DIAGNOSIS — F4323 Adjustment disorder with mixed anxiety and depressed mood: Secondary | ICD-10-CM

## 2018-02-04 NOTE — Progress Notes (Signed)
Behavioral Health Consultation  Jiles Garter [place], PsyD  Psychologist  02/04/2018   10:49 AM       Time spent with Patient: 30 minutes  This is patient's second [facility] appointment.    Reason for Consult:  Depression, anxiety  Referring Provider: Carolina Cellar, MD       S:  Pt set goals to 1) consider driving short distances to work on getting to know the area     Pt reports she fell shortly after first visit. Went to ED. Was in pain which wasn't helpful with how she has been feeling.  Also has some health issues. Still feeling a bit down with all the recent changes in her life. Loss of her independence is hardest - misses being able to drive. Also feels like the youngest and most able bodied person at her senior living. Does have some short-term memory issues and states son tells her she isn't safe to drive. Wonders if that is true but mostly disagrees.      O:  MSE:    Appearance: good hygiene   Attitude: cooperative and friendly  Consciousness: alert  Orientation: oriented to person, place, time, general circumstance  Memory: impaired  Attention/Concentration: lost train of thought while speaking  Psychomotor Activity:normal  Eye Contact: normal  Speech: normal rate and volume, well-articulated  Mood: "ok"  Affect: euthymic and congruent  Perception: within normal limits  Thought Content: within normal limits  Thought Process: logical, coherent and goal-directed  Insight: fair  Judgment: intact  Ability to understand instructions: Yes  Ability to respond meaningfully: Yes  Morbid Ideation: no   Suicide Assessment: no suicidal ideation, plan, or intent  Homicidal Ideation: no      History:    Medications:   Current Outpatient Medications   Medication Sig Dispense Refill   ??? tiZANidine (ZANAFLEX) 2 MG tablet Take 1 tablet by mouth every 8 hours as needed (muscle spasm) 30 tablet 0   ??? venlafaxine (EFFEXOR) 100 MG tablet TAKE 1 TABLET BY MOUTH TWICE A DAY 60 tablet 2   ??? Multiple Vitamins-Minerals (MULTIVITAMIN ADULT  PO) Take by mouth     ??? amLODIPine (NORVASC) 5 MG tablet Take 1 tablet by mouth daily With 2.5 mg for 7.5mg  total daily 90 tablet 1   ??? amLODIPine (NORVASC) 2.5 MG tablet Take 1 tablet by mouth daily With 5mg  tablet for 7.5 mg total daily 90 tablet 1   ??? FEXOFENADINE HCL PO Take by mouth     ??? BIOTIN FORTE PO Take by mouth     ??? Cyanocobalamin (B-12 PO) Take by mouth       No current facility-administered medications for this visit.        Social History:   Social History     Socioeconomic History   ??? Marital status: Widowed     Spouse name: Not on file   ??? Number of children: Not on file   ??? Years of education: Not on file   ??? Highest education level: Not on file   Occupational History   ??? Not on file   Social Needs   ??? Financial resource strain: Not hard at all   ??? Food insecurity:     Worry: Never true     Inability: Never true   ??? Transportation needs:     Medical: No     Non-medical: No   Tobacco Use   ??? Smoking status: Never Smoker   ??? Smokeless tobacco: Never  Used   Substance and Sexual Activity   ??? Alcohol use: Never     Frequency: Never   ??? Drug use: Never   ??? Sexual activity: Not Currently   Lifestyle   ??? Physical activity:     Days per week: Not on file     Minutes per session: Not on file   ??? Stress: Not on file   Relationships   ??? Social connections:     Talks on phone: Not on file     Gets together: Not on file     Attends religious service: Not on file     Active member of club or organization: Not on file     Attends meetings of clubs or organizations: Not on file     Relationship status: Not on file   ??? Intimate partner violence:     Fear of current or ex partner: Not on file     Emotionally abused: Not on file     Physically abused: Not on file     Forced sexual activity: Not on file   Other Topics Concern   ??? Not on file   Social History Narrative   ??? Not on file       TOBACCO:   reports that she has never smoked. She has never used smokeless tobacco.  ETOH:   reports no history of alcohol  use.    Family History:   No family history on file.      A:  Ms. Brozowski continues to experience depressed and anxious mood secondary to significant life changes. She continues to be active and engaged and responds positively to behavioral interventions.    GAD 7 SCORE 02/04/2018 01/15/2018   GAD-7 Total Score 0 3     Interpretation of GAD-7 score: 5-9 = mild anxiety, 10-14 = moderate anxiety, 15+ = severe anxiety. Recommend referral to behavioral health for scores 10 or greater.    PHQ Scores 02/04/2018 02/01/2018 01/15/2018 10/22/2017 10/05/2017   PHQ2 Score 1 0 3 0 0   PHQ9 Score 3 0 4 0 0     Interpretation of Total Score Depression Severity: 1-4 = Minimal depression, 5-9 = Mild depression, 10-14 = Moderate depression, 15-19 = Moderately severe depression, 20-27 = Severe depression      Diagnosis:    1. Adjustment disorder with mixed anxiety and depressed mood           Plan:  Pt interventions:    Agenda-setting to identify pt's primary goals for [facility] visit / overall health, Supportive techniques, reinforced pt's skill use, ACT interventions, assisted pt with problem solving and treatment planning    Pt Behavioral Change Plan:  Pt set goals to 1) consider meeting with PCP to discuss her assessment of your safety and ability to drive 2) Return in about 3 weeks (around 02/25/2018).

## 2018-02-05 DIAGNOSIS — F4323 Adjustment disorder with mixed anxiety and depressed mood: Secondary | ICD-10-CM

## 2018-02-08 ENCOUNTER — Emergency Department: Admit: 2018-02-08 | Payer: MEDICARE | Primary: Internal Medicine

## 2018-02-08 ENCOUNTER — Inpatient Hospital Stay
Admission: EM | Admit: 2018-02-08 | Discharge: 2018-02-10 | Disposition: A | Discharge status: Home / Self Care | Payer: MEDICARE | Source: Other Acute Inpatient Hospital | Admitting: Internal Medicine

## 2018-02-08 DIAGNOSIS — N390 Urinary tract infection, site not specified: Principal | ICD-10-CM

## 2018-02-08 LAB — BASIC METABOLIC PANEL W/ REFLEX TO MG FOR LOW K
Anion Gap: 13 (ref 3–16)
BUN: 20 mg/dL (ref 7–20)
CO2: 28 mmol/L (ref 21–32)
Calcium: 9.6 mg/dL (ref 8.3–10.6)
Chloride: 98 mmol/L — ABNORMAL LOW (ref 99–110)
Creatinine: 0.7 mg/dL (ref 0.6–1.2)
GFR African American: >60 (ref >60)
GFR Non-African American: >60 (ref >60)
Glucose: 131 mg/dL — ABNORMAL HIGH (ref 70–99)
Potassium reflex Magnesium: 3.9 mmol/L (ref 3.5–5.1)
Sodium: 139 mmol/L (ref 136–145)

## 2018-02-08 LAB — RAPID INFLUENZA A/B ANTIGENS
Rapid Influenza A Ag: NEGATIVE
Rapid Influenza B Ag: NEGATIVE

## 2018-02-08 LAB — CBC WITH AUTO DIFFERENTIAL
Basophils %: 0.1 %
Basophils Absolute: 0 10*3/uL (ref 0.0–0.2)
Eosinophils %: 0 %
Eosinophils Absolute: 0 10*3/uL (ref 0.0–0.6)
Hematocrit: 39.2 % (ref 36.0–48.0)
Hemoglobin: 13 g/dL (ref 12.0–16.0)
Lymphocytes %: 11.6 %
Lymphocytes Absolute: 0.9 10*3/uL — ABNORMAL LOW (ref 1.0–5.1)
MCH: 31.4 pg (ref 26.0–34.0)
MCHC: 33.2 g/dL (ref 31.0–36.0)
MCV: 94.4 fL (ref 80.0–100.0)
MPV: 8 fL (ref 5.0–10.5)
Monocytes %: 9.1 %
Monocytes Absolute: 0.7 10*3/uL (ref 0.0–1.3)
Neutrophils %: 79.2 %
Neutrophils Absolute: 6.3 10*3/uL (ref 1.7–7.7)
Platelets: 273 10*3/uL (ref 135–450)
RBC: 4.15 M/uL (ref 4.00–5.20)
RDW: 13.8 % (ref 12.4–15.4)
WBC: 8 10*3/uL (ref 4.0–11.0)

## 2018-02-08 LAB — EKG 12-LEAD
Atrial Rate: 86 {beats}/min
P Axis: 53 degrees
P-R Interval: 164 ms
Q-T Interval: 384 ms
QRS Duration: 92 ms
QTc Calculation (Bazett): 459 ms
R Axis: -34 degrees
T Axis: 74 degrees
Ventricular Rate: 86 {beats}/min

## 2018-02-08 LAB — URINALYSIS
Bilirubin Urine: NEGATIVE
Glucose, Ur: NEGATIVE mg/dL
Ketones, Urine: NEGATIVE mg/dL
Nitrite, Urine: NEGATIVE
Specific Gravity, UA: 1.025 (ref 1.005–1.030)
Urobilinogen, Urine: 0.2 E.U./dL (ref <2.0)
pH, UA: 6 (ref 5.0–8.0)

## 2018-02-08 LAB — MICROSCOPIC URINALYSIS

## 2018-02-08 LAB — TROPONIN: Troponin: <0.01 ng/mL (ref <0.01)

## 2018-02-08 MED ORDER — AMLODIPINE BESYLATE 5 MG PO TABS
5 MG | Freq: Every day | ORAL | Status: DC
Start: 2018-02-08 — End: 2018-02-09

## 2018-02-08 MED ORDER — ENOXAPARIN SODIUM 40 MG/0.4ML SC SOLN
40 | Freq: Every day | SUBCUTANEOUS | Status: DC
Start: 2018-02-08 — End: 2018-02-10
  Administered 2018-02-09 – 2018-02-10 (×3): 40 mg via SUBCUTANEOUS

## 2018-02-08 MED ORDER — ONDANSETRON HCL 4 MG/2ML IJ SOLN
4 MG/2ML | Freq: Four times a day (QID) | INTRAMUSCULAR | Status: DC | PRN
Start: 2018-02-08 — End: 2018-02-10

## 2018-02-08 MED ORDER — VENLAFAXINE HCL 25 MG PO TABS
25 MG | Freq: Two times a day (BID) | ORAL | Status: DC
Start: 2018-02-08 — End: 2018-02-10
  Administered 2018-02-09 – 2018-02-10 (×4): 100 mg via ORAL

## 2018-02-08 MED ORDER — ACETAMINOPHEN 325 MG PO TABS
325 MG | ORAL | Status: DC | PRN
Start: 2018-02-08 — End: 2018-02-10

## 2018-02-08 MED ORDER — DEXTROSE 5 % IV SOLN (MINI-BAG)
5 % | INTRAVENOUS | Status: DC
Start: 2018-02-08 — End: 2018-02-10
  Administered 2018-02-09 – 2018-02-10 (×2): 1 g via INTRAVENOUS

## 2018-02-08 MED ORDER — AMLODIPINE BESYLATE 5 MG PO TABS
5 MG | Freq: Every day | ORAL | Status: DC
Start: 2018-02-08 — End: 2018-02-08

## 2018-02-08 MED ORDER — SODIUM CHLORIDE 0.9 % IV SOLN
0.9 % | INTRAVENOUS | Status: DC
Start: 2018-02-08 — End: 2018-02-10
  Administered 2018-02-08: 19:00:00 1000 mL via INTRAVENOUS

## 2018-02-08 MED ORDER — NORMAL SALINE FLUSH 0.9 % IV SOLN
0.9 % | INTRAVENOUS | Status: DC | PRN
Start: 2018-02-08 — End: 2018-02-10

## 2018-02-08 MED ORDER — TIZANIDINE HCL 4 MG PO TABS
4 MG | Freq: Three times a day (TID) | ORAL | Status: DC | PRN
Start: 2018-02-08 — End: 2018-02-10

## 2018-02-08 MED ORDER — DEXTROSE 5 % IV SOLN (MINI-BAG)
5 % | Freq: Once | INTRAVENOUS | Status: AC
Start: 2018-02-08 — End: 2018-02-08
  Administered 2018-02-08: 21:00:00 1 g via INTRAVENOUS

## 2018-02-08 MED ORDER — MAGNESIUM HYDROXIDE 400 MG/5ML PO SUSP
400 MG/5ML | Freq: Every day | ORAL | Status: DC | PRN
Start: 2018-02-08 — End: 2018-02-10

## 2018-02-08 MED ORDER — NORMAL SALINE FLUSH 0.9 % IV SOLN
0.9 | Freq: Two times a day (BID) | INTRAVENOUS | Status: DC
Start: 2018-02-08 — End: 2018-02-10
  Administered 2018-02-09 – 2018-02-10 (×4): 10 mL via INTRAVENOUS

## 2018-02-08 MED FILL — VENLAFAXINE HCL 25 MG PO TABS: 25 mg | ORAL | Qty: 4

## 2018-02-08 MED FILL — CEFTRIAXONE SODIUM 1 G IJ SOLR: 1 g | INTRAMUSCULAR | Qty: 1

## 2018-02-08 MED FILL — SODIUM CHLORIDE 0.9 % IV SOLN: 0.9 % | INTRAVENOUS | Qty: 1000

## 2018-02-08 NOTE — Progress Notes (Signed)
Patient admitted to room 5527 from the ED. A/O x4 with intermittent confusion regarding place. Vital signs stable with exception to elevated blood pressure. Oriented to room and surroundings. Head to toe and admission assessment completed. Skin intact with exception to scab on left knee, see flow sheets. Up to bathroom SBA, no assistive devices needed. Voiding without complications. Patient educated on bed alarm and importance of using call light when needing assistance. Fall precautions in place. Bed locked in lowest position with alarm on. Belongings and call light placed within reach. Currently resting with no further needs. Will continue to monitor.

## 2018-02-08 NOTE — ED Provider Notes (Signed)
Date of evaluation: 02/08/2018    Chief Complaint   Altered Mental Status and Hypertension      Nursing Notes, Past Medical Hx, Past Surgical Hx, Social Hx, Allergies, and Family Hx were reviewed.    History of Present Illness     Leslie Bradley is a 85 y.o. female who presents with mental status change.  Apparently she had episode of disorientation and confusion last night.  Her son who is a neurologist according to the daughter had called and stated he did not think it was a stroke but could be from an type of metabolic process causing her weakness.  Apparently this morning she had another episode and her daughter had come to her assisted or independent living and none of her medications were taken for the last few days.  She denies any symptoms now.  She was confused as far as who the president was and where she was at which is not like her this morning.  She has had no urinary symptoms no chest pain shortness of breath.  She did hit her head a few weeks ago and had head CT which was negative for any intracranial bleed.  She does have what sounds as though is some dementia which is caused her to move from [place] back to [place] and independent living.  She has been otherwise functional.  No change or new medications other than Effexor dose which had been increased 6 weeks ago.    Review of Systems     Review of Systems   Constitutional: Negative for activity change.   HENT: Negative.    Eyes: Negative.    Respiratory: Negative.    Cardiovascular: Negative.    Gastrointestinal: Negative.    Genitourinary: Negative.  Negative for dysuria.   Psychiatric/Behavioral: Positive for confusion.   All other systems reviewed and are negative.      Past Medical, Surgical, Family, and Social History     She has a past medical history of Breast cancer (HCC) and Hypertension.  She has a past surgical history that includes Breast lumpectomy.  Her family history is not on file.  She reports that she has never  smoked. She has never used smokeless tobacco. She reports that she does not drink alcohol or use drugs.    Medications     Previous Medications    AMLODIPINE (NORVASC) 2.5 MG TABLET    Take 1 tablet by mouth daily With 5mg  tablet for 7.5 mg total daily    AMLODIPINE (NORVASC) 5 MG TABLET    Take 1 tablet by mouth daily With 2.5 mg for 7.5mg  total daily    BIOTIN FORTE PO    Take by mouth    CYANOCOBALAMIN (B-12 PO)    Take by mouth    FEXOFENADINE HCL PO    Take by mouth    MULTIPLE VITAMINS-MINERALS (MULTIVITAMIN ADULT PO)    Take by mouth    TIZANIDINE (ZANAFLEX) 2 MG TABLET    Take 1 tablet by mouth every 8 hours as needed (muscle spasm)    VENLAFAXINE (EFFEXOR) 100 MG TABLET    TAKE 1 TABLET BY MOUTH TWICE A DAY       Allergies     She is allergic to amoxicillin; aspirin; diflucan [fluconazole]; and erythromycin.    Physical Exam     INITIAL VITALS: BP (!) 172/82    Pulse 88    Temp 98.4 ??F (36.9 ??C) (Oral)    Resp 18    Ht  5\' 6"  (1.676 m)    Wt 195 lb (88.5 kg)    SpO2 96%    BMI 31.47 kg/m??    Physical Exam  Vitals signs and nursing note reviewed.   Constitutional:       Appearance: Normal appearance.   HENT:      Head: Normocephalic and atraumatic.      Right Ear: External ear normal.      Left Ear: External ear normal.      Nose: Nose normal.      Mouth/Throat:      Mouth: Mucous membranes are moist.   Eyes:      Extraocular Movements: Extraocular movements intact.      Conjunctiva/sclera: Conjunctivae normal.      Pupils: Pupils are equal, round, and reactive to light.   Neck:      Musculoskeletal: Normal range of motion. No neck rigidity.   Cardiovascular:      Rate and Rhythm: Normal rate and regular rhythm.      Heart sounds: No murmur. No friction rub. No gallop.    Pulmonary:      Effort: Pulmonary effort is normal.   Abdominal:      General: Abdomen is flat. Bowel sounds are normal. There is no distension.      Tenderness: There is no tenderness.   Musculoskeletal: Normal range of motion.   Skin:      General: Skin is warm.   Neurological:      Mental Status: She is alert.      Comments: He is awake alert cranial nerves are intact no pronator drift normal strength in upper and lower extremities sensations intact short-term 3 object memory cannot recall any at 5 minutes.  She long-term memories intact knowing her Social Security number and date of birth   Psychiatric:         Mood and Affect: Mood normal.         Thought Content: Thought content normal.         Diagnostic Results     EKG   Normal sinus rhythm left atrial enlargement LVH QTC 459 no acute ischemia    RADIOLOGY:  XR CHEST STANDARD (2 VW)   Final Result      Clear lungs.      Tortuous descending thoracic aorta.      Normal cardiac silhouette.      CT Head WO Contrast   Final Result      Sinus disease as above.      Atrophy and white matter ischemic change.      No acute hemorrhage or mass effect identified.                       See EMR    LABS:   Labs Reviewed   CBC WITH AUTO DIFFERENTIAL - Abnormal; Notable for the following components:       Result Value    Lymphocytes Absolute 0.9 (*)     All other components within normal limits    Narrative:     Performed at:  [facility]  [address],  [place], [place] [id number]   Phone [phone]   BASIC METABOLIC PANEL W/ REFLEX TO MG FOR LOW K - Abnormal; Notable for the following components:    Chloride 98 (*)     Glucose 131 (*)     All other components within normal limits    Narrative:  Performed at:  [facility]  [address],  [place], [place] [address]   Phone [phone]   URINALYSIS - Abnormal; Notable for the following components:    Blood, Urine TRACE-LYSED (*)     Protein, UA TRACE (*)     Leukocyte Esterase, Urine SMALL (*)     All other components within normal limits    Narrative:     Performed at:  [facility]  [address],  [place], [place] [address]   Phone  [phone]   MICROSCOPIC URINALYSIS - Abnormal; Notable for the following components:    Casts 0-1 Hyaline (*)     Mucus, UA 2+ (*)     WBC, UA 50-100 (*)     Bacteria, UA 2+ (*)     All other components within normal limits    Narrative:     Performed at:  [facility]  [address],  [place], [place] [address]   Phone [phone]   RAPID INFLUENZA A/B ANTIGENS    Narrative:     Performed at:  [facility]  [address]  [place], [place] [address]   Phone [phone]   URINE CULTURE   TROPONIN    Narrative:     Performed at:  The [facility]  [address],  [place], [place] [address]   Phone [phone]   TSH WITHOUT REFLEX   T4       BEDSIDE ULTRASOUND:      VITALS:  BP: (!) 172/82, Temp: 98.4 ??F (36.9 ??C), Pulse: 88, Resp: 18     Procedures         ED Course     The patient was given the followingmedications:  Orders Placed This Encounter   Medications   ??? 0.9 % sodium chloride infusion   ??? cefTRIAXone (ROCEPHIN) 1 g IVPB in 50 mL D5W minibag            CONSULTS:  IP CONSULT TO HOSPITALIST  IP CONSULT TO RESIDENT INTERNAL MEDICINE    MEDICAL DECISION MAKING     Mianna Bosman is a 85 y.o. female Modena Jansky with episodic effusion which had an episode last night and again today.  She had not taken her meds for 3 days.  She does have evidence of UTI with 5200 white blood cells and bacteria.  This was sent for culture.  She was given 1 g of Rocephin.  Head CT did not show any acute abnormality.  She still is not at her baseline.  She does have long-term memory but short-term is still diminished.  This could be on the basis of UTI.  Also she had been on Effexor not taking it 3 days which may add to this picture.  She lives at independent living where I do not feel comfortable discharging her still not totally clear as far as her mental state.  She will require admission for IV antibiotics IV  fluids and neurologic reassessment      Clinical Impression     1. Altered mental status, unspecified altered mental status type    2. Acute UTI (urinary tract infection)        Roosvelt Harps     PATIENT REFERRED TO:  Carolina Cellar, MD  [address] [place] [address]  [phone]  DISCHARGE MEDICATIONS:  New Prescriptions    No medications on file       DISPOSITION Decision To Discharge 02/08/2018 04:54:39 PM      Leighton RoachJoseph J Kandra Graven, MD  02/08/18 1655

## 2018-02-08 NOTE — ED Triage Notes (Signed)
Patient is an 85 year old female who presents to the ED from home with c/o confusion and hypertension.  Confusion began last night around dinner time.  Patient has not taken her home meds for the last several days, including her effexor and BP meds.  Patient is alert and oriented with even and unlabored respirations.

## 2018-02-08 NOTE — Progress Notes (Signed)
4 Eyes Admission Assessment     I agree as the admission nurse that 2 RN's have performed a thorough Head to Toe Skin Assessment on the patient. ALL assessment sites listed below have been assessed on admission.       Areas assessed by both nurses:   [x]    Head, Face, and Ears   [x]    Shoulders, Back, and Chest  [x]    Arms, Elbows, and Hands   [x]    Coccyx, Sacrum, and Ischum  [x]    Legs, Feet, and Heels        Does the Patient have Skin Breakdown?  No         Braden Prevention initiated:  No   Wound Care Orders initiated:  No      WOC nurse consulted for Pressure Injury (Stage 3,4, Unstageable, DTI, NWPT, and Complex wounds):  No      Nurse 1 eSignature: Electronically signed by Davy Pique, RN on 02/08/18 at 7:39 PM    **SHARE this note so that the co-signing nurse is able to place an eSignature**    Nurse 2 eSignature: Electronically signed by Charna Busman, RN on 02/09/18 at 1:34 AM

## 2018-02-08 NOTE — ED Notes (Signed)
Food tray provided after patient request to eat and order okayed and verified by physician. Patient in high-fowlers with bedside table in place, assisted with food arrangement and opened all containers.      Josem Kaufmann, RN  02/08/18 [phone]

## 2018-02-08 NOTE — ED Notes (Signed)
Pt to floor room 5527 post floor Rn receiving report.     Josem Kaufmann, RN  02/08/18 [phone]

## 2018-02-08 NOTE — H&P (Signed)
Internal Medicine  PGY 1  History & Physical      CC: Altered Mental Status    History Obtained From:  patient, electronic medical record    HISTORY OF PRESENT ILLNESS:    Sabino NiemannMargaret Nulty is a 85 y.o. female with PMH HTN, breast cancer who presents with altered mental status. She initially was noted by family to become more confused yesterday night. Her son is a Insurance account managerneurologist and spoke to her on the phone. She has baseline dementia but he felt that she was different from her baseline and forgetting things she usually remembers. This morning she had another episode of confusion and was forgetting things she usually remembers. Her daughter brought her in from an independent living facility. Her daughter noted that she had not taken any of her medications for the past few days, including her Effexor which was increased in dose 2 months ago. She endorses some back discomfort but denies fevers, chills, cough, abdominal pain, dysuria, increased urinary frequency. Of note, patient also had a recent mechanical fall 01/19/18 where she fell on her left side. She presented to the ED at that time and CT Head was unremarkable.     In the ED, the patient was afebrile, patient hypertensive to 192/82. Workup including CBC, BMP, troponins, rapid flu were unremarkable. CT Head negative for acute process. UA positive for 50-100 WBC, small leukocyte esterase, 2+ bacteria.    Past Medical History:        Diagnosis Date   ??? Breast cancer (HCC)    ??? Hypertension        Past Surgical History:        Procedure Laterality Date   ??? BREAST LUMPECTOMY         Medications Priorto Admission:    Medications Prior to Admission: venlafaxine (EFFEXOR) 100 MG tablet, TAKE 1 TABLET BY MOUTH TWICE A DAY  Multiple Vitamins-Minerals (MULTIVITAMIN ADULT PO), Take by mouth  amLODIPine (NORVASC) 5 MG tablet, Take 1 tablet by mouth daily With 2.5 mg for 7.5mg  total daily  amLODIPine (NORVASC) 2.5 MG tablet, Take 1 tablet by mouth daily With 5mg  tablet for 7.5  mg total daily  FEXOFENADINE HCL PO, Take by mouth  tiZANidine (ZANAFLEX) 2 MG tablet, Take 1 tablet by mouth every 8 hours as needed (muscle spasm)  BIOTIN FORTE PO, Take by mouth  Cyanocobalamin (B-12 PO), Take by mouth    Allergies:  Amoxicillin; Aspirin; Diflucan [fluconazole]; and Erythromycin    Social History:   ?? TOBACCO:   reports that she has never smoked. She has never used smokeless tobacco.  ?? ETOH:   reports no history of alcohol use.  ?? DRUGS : Denies  ?? Patient currently lives independent living    Family History:   History reviewed. No pertinent family history.    Review of Systems   Constitutional: Negative for chills and fever.   Respiratory: Negative for cough and shortness of breath.    Cardiovascular: Negative for chest pain and leg swelling.   Gastrointestinal: Negative for abdominal pain, constipation and vomiting.   Genitourinary: Negative for difficulty urinating, dysuria and frequency.   Musculoskeletal: Positive for back pain.   Neurological: Negative for weakness, numbness and headaches.       ROS: A 10 point review of systems was conducted, significant findings as noted in HPI.    Physical Exam  Vitals signs and nursing note reviewed. Exam conducted with a chaperone present.   Constitutional:       Comments:  Laying in bed, no apparent distress    HENT:      Head: Atraumatic.   Eyes:      Conjunctiva/sclera: Conjunctivae normal.      Pupils: Pupils are equal, round, and reactive to light.   Neck:      Musculoskeletal: Neck supple.   Cardiovascular:      Rate and Rhythm: Normal rate and regular rhythm.      Heart sounds: Normal heart sounds. No murmur.   Pulmonary:      Effort: Pulmonary effort is normal.      Breath sounds: Normal breath sounds. No wheezing.   Abdominal:      General: Bowel sounds are normal.      Palpations: Abdomen is soft.      Tenderness: There is no tenderness.   Skin:     General: Skin is warm and dry.      Capillary Refill: Capillary refill takes less than 2  seconds.   Neurological:      General: No focal deficit present.      Mental Status: She is alert and oriented to person, place, and time.      Cranial Nerves: No cranial nerve deficit.      Comments: Alert and oriented x 3            Vitals:    02/10/18 0006   BP: (!) 180/84   Pulse: 94   Resp: 17   Temp: 98.5 ??F (36.9 ??C)   SpO2: 93%       DATA:    Labs:  CBC:   Recent Labs     02/08/18  1401 02/09/18  0518   WBC 8.0 7.8   HGB 13.0 12.7   HCT 39.2 38.5   PLT 273 257       BMP:   Recent Labs     02/08/18  1401 02/09/18  0518   NA 139 140   K 3.9 3.6   CL 98* 101   CO2 28 26   BUN 20 14   CREATININE 0.7 0.6   GLUCOSE 131* 111*     LFT's: No results for input(s): AST, ALT, ALB, BILITOT, ALKPHOS in the last 72 hours.  Troponin:   Recent Labs     02/08/18  1401   TROPONINI <0.01     BNP:No results for input(s): BNP in the last 72 hours.  ABGs: No results for input(s): PHART, PCO2ART, PO2ART in the last 72 hours.  INR: No results for input(s): INR in the last 72 hours.    U/A:  Recent Labs     02/08/18  1501   COLORU Yellow   PHUR 6.0   LABCAST 0-1 Hyaline*   WBCUA 50-100*   RBCUA 0-2   MUCUS 2+*   BACTERIA 2+*   CLARITYU Clear   SPECGRAV 1.025   LEUKOCYTESUR SMALL*   UROBILINOGEN 0.2   BILIRUBINUR Negative   BLOODU TRACE-LYSED*   GLUCOSEU Negative       Imaging:  XR CHEST STANDARD (2 VW)   Final Result      Clear lungs.      Tortuous descending thoracic aorta.      Normal cardiac silhouette.      CT Head WO Contrast   Final Result      Sinus disease as above.      Atrophy and white matter ischemic change.      No acute hemorrhage or mass effect identified.  ASSESSMENT AND PLAN:    Shondel Schaak is a 85 y.o. female with PMH HTN, breast cancer who presents with altered mental status.    Acute Metabolic Encephalopathy likely 2/2 UTI  Patient with dementia presented with confusion worse than her baseline. Afebrile, VSS. No leukocytosis. Workup unremarkable including CBC, BMP, CT Head. UA showing  50-100 WBC, 2+ bacteria, small leuk esterase. Could potentially be due to Effexor withdrawal as patient has not been taking it over the past few days.  -Continue rocephin  -AMS improving, patient alert and oriented x 3  -Urine culture pending  -Restart home effexor    Chronic Medical Problems:  HTN: Continue home amlodipine  Depression: Continue home effexor    Will discuss with attending physician Dr. Donnie Coffin.    Code Status: Full code  FEN: Regular  PPX: Lovenox  DISPO: IP    Griselda Miner, MD  02/10/2018,  12:37 AM

## 2018-02-09 DIAGNOSIS — N390 Urinary tract infection, site not specified: Secondary | ICD-10-CM

## 2018-02-09 LAB — T4: T4, Total: 6.5 ug/dL (ref 4.5–10.9)

## 2018-02-09 LAB — TSH: TSH: 2.42 u[IU]/mL (ref 0.27–4.20)

## 2018-02-09 LAB — CBC WITH AUTO DIFFERENTIAL
Basophils %: 0.5 %
Basophils Absolute: 0 10*3/uL (ref 0.0–0.2)
Eosinophils %: 0.5 %
Eosinophils Absolute: 0 10*3/uL (ref 0.0–0.6)
Hematocrit: 38.5 % (ref 36.0–48.0)
Hemoglobin: 12.7 g/dL (ref 12.0–16.0)
Lymphocytes %: 18.6 %
Lymphocytes Absolute: 1.4 10*3/uL (ref 1.0–5.1)
MCH: 31.2 pg (ref 26.0–34.0)
MCHC: 33.1 g/dL (ref 31.0–36.0)
MCV: 94.3 fL (ref 80.0–100.0)
MPV: 8.4 fL (ref 5.0–10.5)
Monocytes %: 9.2 %
Monocytes Absolute: 0.7 10*3/uL (ref 0.0–1.3)
Neutrophils %: 71.2 %
Neutrophils Absolute: 5.6 10*3/uL (ref 1.7–7.7)
Platelets: 257 10*3/uL (ref 135–450)
RBC: 4.09 M/uL (ref 4.00–5.20)
RDW: 13.8 % (ref 12.4–15.4)
WBC: 7.8 10*3/uL (ref 4.0–11.0)

## 2018-02-09 LAB — BASIC METABOLIC PANEL W/ REFLEX TO MG FOR LOW K
Anion Gap: 13 (ref 3–16)
BUN: 14 mg/dL (ref 7–20)
CO2: 26 mmol/L (ref 21–32)
Calcium: 8.9 mg/dL (ref 8.3–10.6)
Chloride: 101 mmol/L (ref 99–110)
Creatinine: 0.6 mg/dL (ref 0.6–1.2)
GFR African American: >60 (ref >60)
GFR Non-African American: >60 (ref >60)
Glucose: 111 mg/dL — ABNORMAL HIGH (ref 70–99)
Potassium reflex Magnesium: 3.6 mmol/L (ref 3.5–5.1)
Sodium: 140 mmol/L (ref 136–145)

## 2018-02-09 LAB — CULTURE, URINE: Urine Culture, Routine: <10000

## 2018-02-09 MED ORDER — AMLODIPINE BESYLATE 5 MG PO TABS
5 MG | Freq: Every day | ORAL | Status: DC
Start: 2018-02-09 — End: 2018-02-10
  Administered 2018-02-09 – 2018-02-10 (×2): 7.5 mg via ORAL

## 2018-02-09 MED ORDER — LABETALOL HCL 20 MG/4ML IV SOSY
20 MG/4ML | INTRAVENOUS | Status: DC | PRN
Start: 2018-02-09 — End: 2018-02-10

## 2018-02-09 MED FILL — LOVENOX 40 MG/0.4ML SC SOLN: 40 MG/0.4ML | SUBCUTANEOUS | Qty: 0.4

## 2018-02-09 MED FILL — VENLAFAXINE HCL 25 MG PO TABS: 25 mg | ORAL | Qty: 4

## 2018-02-09 MED FILL — AMLODIPINE BESYLATE 5 MG PO TABS: 5 mg | ORAL | Qty: 2

## 2018-02-09 MED FILL — CEFTRIAXONE SODIUM 1 G IJ SOLR: 1 g | INTRAMUSCULAR | Qty: 1

## 2018-02-09 NOTE — Progress Notes (Signed)
Patient currently resting in bed. Alert and oriented x 4. Daughter present at bedside. Patient up as tolerated. Message sent to Dr. Doreen Beam regarding patients elevated bp. Nurse will continue to monitor/reassess patient. Call light within reach. Rayvon Char

## 2018-02-09 NOTE — Plan of Care (Signed)
Problem: Pain:  Goal: Pain level will decrease  Description  Pain level will decrease  Outcome: Ongoing  Note:   Patient denies pain at this time, patient exhibits no objective characteristics of pain, will continue to monitor.       Problem: SAFETY  Goal: Free from accidental physical injury  Outcome: Ongoing  Note:   Patient up ad lib at this time, tolerating ambulation well independently w/o hands on assist from staff. Hourly rounding on patient for needs. Non-skid socks on, bed in lowest position and locked. Bedside table, personal belongs, and nurse call light within reach. Instructed patient to use call light for assistance. Floor clear of clutter. Patient remains free of falls at this time. Will continue to monitor.

## 2018-02-09 NOTE — Plan of Care (Signed)
Problem: Falls - Risk of:  Goal: Will remain free from falls  Description  Will remain free from falls  Outcome: Ongoing     Problem: Pain:  Goal: Pain level will decrease  Description  Pain level will decrease  Outcome: Ongoing

## 2018-02-09 NOTE — Progress Notes (Signed)
Pt is alert and oriented x 4, but is sometimes pleasantly confused. Ambulating ad lib with a steady gait. Has remained afebrile with no complaints of dysuria. Drinking fluids has been encouraged. Vitals have been stable. BP was a bit high due to anxiety, but has decreased since then. Will continue to assess and monitor.

## 2018-02-09 NOTE — Progress Notes (Signed)
Patient is a/o x4. Patient denies c/o nausea/pain. VSS w/exception to elevated BP. Non-skid socks on, SCD'S remain in place.  Bed locked and in lowest position, bedside table and nurse call light within reach. Instructed patient to call out for assistance. Patient remains free from falls at this time, will continue to monitor.

## 2018-02-09 NOTE — Progress Notes (Deleted)
Pt is alert and oriented x 4. Calls out appropriately. Surgical incision is CD&I. Prevena wound vac in place with no output. Notes baseline numbness / tingling in RLE. Voiding adequately via foley. Pain managed via scheduled oxycodone and gabapentin with relief. Brace in place, pt on bedrest. Will continue to assess and monitor.

## 2018-02-09 NOTE — Progress Notes (Addendum)
Internal Medicine  Progress Note  PGY-2    Hospital Day: 2                                                      Admit Date: 02/08/2018                                     PCP: Carolina Cellar, MD      CC:  Altered Mental Status                      Interval Hx: BP 170/80s overnight, given home Norvasc, otherwise no acute events.     Daily Plan:  02/09/2018    Subjective: Patient seen and examined at bedside. Thinks she is doing better. AAOx4 but does still seem somewhat confused, may be due to her underlying dementia. Denies any urinary complaints, no dysuria, frequency, urgency, hematuria. Denies chest pain, SOB.       Medications:    Scheduled Meds:  ??? amLODIPine  7.5 mg Oral Daily   ??? venlafaxine  100 mg Oral BID   ??? sodium chloride flush  10 mL Intravenous 2 times per day   ??? enoxaparin  40 mg Subcutaneous Daily   ??? cefTRIAXone (ROCEPHIN) IV  1 g Intravenous Q24H      Continuous Infusions:  ??? sodium chloride Stopped (02/08/18 1737)     PRN Meds:tiZANidine, sodium chloride flush, magnesium hydroxide, ondansetron, acetaminophen    Allergies:   Allergies   Allergen Reactions   ??? Amoxicillin    ??? Aspirin    ??? Diflucan [Fluconazole]    ??? Erythromycin          Physical Exam:     Vitals: BP (!) 172/84    Pulse 91    Temp 98.4 ??F (36.9 ??C) (Oral)    Resp 16    Ht 5\' 6"  (1.676 m)    Wt 195 lb (88.5 kg)    SpO2 93%    BMI 31.47 kg/m??     I/O:      Intake/Output Summary (Last 24 hours) at 02/09/2018 0705  Last data filed at 02/09/2018 0246  Gross per 24 hour   Intake 250 ml   Output 300 ml   Net -50 ml     Physical Exam  Constitutional:       General: She is not in acute distress.     Appearance: Normal appearance. She is not ill-appearing, toxic-appearing or diaphoretic.   HENT:      Head: Normocephalic and atraumatic.   Eyes:      General: No scleral icterus.        Right eye: No discharge.         Left eye: No discharge.      Extraocular Movements: Extraocular movements intact.   Neck:      Musculoskeletal: Normal range of  motion and neck supple. No neck rigidity.   Cardiovascular:      Rate and Rhythm: Normal rate and regular rhythm.      Heart sounds: Normal heart sounds. No murmur. No friction rub. No gallop.    Pulmonary:      Effort: Pulmonary effort is normal. No respiratory distress.  Breath sounds: Normal breath sounds. No wheezing, rhonchi or rales.   Abdominal:      General: Bowel sounds are normal. There is no distension.      Palpations: Abdomen is soft.      Tenderness: There is no tenderness. There is no guarding or rebound.   Musculoskeletal:         General: No swelling or tenderness.   Skin:     General: Skin is warm and dry.   Neurological:      General: No focal deficit present.      Mental Status: She is alert.      Comments: Mild cognitive slowing, AAOx4         DATA:       Labs  CBC:   Recent Labs     02/08/18  1401 02/09/18  0518   WBC 8.0 7.8   HGB 13.0 12.7   HCT 39.2 38.5   PLT 273 257       BMP:   Recent Labs     02/08/18  1401 02/09/18  0518   NA 139 140   K 3.9 3.6   CL 98* 101   CO2 28 26   BUN 20 14   CREATININE 0.7 0.6   GLUCOSE 131* 111*     Troponin:   Recent Labs     02/08/18  1401   TROPONINI <0.01       Urinalysis:  Lab Results   Component Value Date    NITRU Negative 02/08/2018    WBCUA 50-100 02/08/2018    BACTERIA 2+ 02/08/2018    RBCUA 0-2 02/08/2018    BLOODU TRACE-LYSED 02/08/2018    SPECGRAV 1.025 02/08/2018    GLUCOSEU Negative 02/08/2018       Radiology:  XR CHEST STANDARD (2 VW)   Final Result      Clear lungs.      Tortuous descending thoracic aorta.      Normal cardiac silhouette.      CT Head WO Contrast   Final Result      Sinus disease as above.      Atrophy and white matter ischemic change.      No acute hemorrhage or mass effect identified.                         ASSESSMENT AND PLAN:   Leslie Bradley is a 85 y.o. female with PMH HTN, breast cancer who presents with altered mental status.  ??  Acute Metabolic Encephalopathy likely 2/2 UTI-  UA showing 50-100 WBC, 2+ bacteria,  small leuk esterase. Could potentially be due to Effexor withdrawal as patient has not been taking it over the past few days.  - Continue rocephin  - f/u Urine culture  - continue home effexor  - PT/OT consulted  ??  Chronic Medical Problems:  HTN: Continue home amlodipine  Depression: Continue home effexor      Code Status:Full Code  FEN: DIET GENERAL;  PPX: Lovenox  DISPO: GMF  PT/OT Eval Status: Consulted    I will discuss the patient with the Hassie Bruce, MD  -----------------------------  Ronny Flurry, MD  Internal Medicine Resident, PGY-2      Addendum to Resident H& P/Progress note:  I have personally seen,examined and evaluated the patient. I have reviewed the current history, physical findings, labs and assessment and plan and agree with note as documented by resident MD ( Dr.Go and Dr Morrie Sheldon)  Britt Bottom, MD, FACP

## 2018-02-10 LAB — CBC WITH AUTO DIFFERENTIAL
Basophils %: 0.5 %
Basophils Absolute: 0 10*3/uL (ref 0.0–0.2)
Eosinophils %: 1.6 %
Eosinophils Absolute: 0.1 10*3/uL (ref 0.0–0.6)
Hematocrit: 37.1 % (ref 36.0–48.0)
Hemoglobin: 12.3 g/dL (ref 12.0–16.0)
Lymphocytes %: 30.1 %
Lymphocytes Absolute: 2.2 10*3/uL (ref 1.0–5.1)
MCH: 31.1 pg (ref 26.0–34.0)
MCHC: 33.1 g/dL (ref 31.0–36.0)
MCV: 94.2 fL (ref 80.0–100.0)
MPV: 8.2 fL (ref 5.0–10.5)
Monocytes %: 12.1 %
Monocytes Absolute: 0.9 10*3/uL (ref 0.0–1.3)
Neutrophils %: 55.7 %
Neutrophils Absolute: 4 10*3/uL (ref 1.7–7.7)
Platelets: 255 10*3/uL (ref 135–450)
RBC: 3.94 M/uL — ABNORMAL LOW (ref 4.00–5.20)
RDW: 13.9 % (ref 12.4–15.4)
WBC: 7.2 10*3/uL (ref 4.0–11.0)

## 2018-02-10 LAB — BASIC METABOLIC PANEL W/ REFLEX TO MG FOR LOW K
Anion Gap: 13 (ref 3–16)
BUN: 17 mg/dL (ref 7–20)
CO2: 26 mmol/L (ref 21–32)
Calcium: 9 mg/dL (ref 8.3–10.6)
Chloride: 100 mmol/L (ref 99–110)
Creatinine: 0.7 mg/dL (ref 0.6–1.2)
GFR African American: >60 (ref >60)
GFR Non-African American: >60 (ref >60)
Glucose: 94 mg/dL (ref 70–99)
Potassium reflex Magnesium: 3.7 mmol/L (ref 3.5–5.1)
Sodium: 139 mmol/L (ref 136–145)

## 2018-02-10 MED FILL — AMLODIPINE BESYLATE 5 MG PO TABS: 5 mg | ORAL | Qty: 2

## 2018-02-10 MED FILL — VENLAFAXINE HCL 25 MG PO TABS: 25 mg | ORAL | Qty: 4

## 2018-02-10 MED FILL — CEFTRIAXONE SODIUM 1 G IJ SOLR: 1 g | INTRAMUSCULAR | Qty: 1

## 2018-02-10 MED FILL — LOVENOX 40 MG/0.4ML SC SOLN: 40 MG/0.4ML | SUBCUTANEOUS | Qty: 0.4

## 2018-02-10 NOTE — Plan of Care (Signed)
Problem: Falls - Risk of:  Goal: Will remain free from falls  Description  Will remain free from falls  02/10/2018 0958 by Rayvon Char, RN  Outcome: Ongoing  02/10/2018 0230 by Pryor Montes, RN  Outcome: Ongoing     Problem: Falls - Risk of:  Goal: Absence of physical injury  Description  Absence of physical injury  02/10/2018 0230 by Pryor Montes, RN  Outcome: Ongoing

## 2018-02-10 NOTE — Discharge Summary (Addendum)
INTERNAL MEDICINE DEPARTMENT AT THE [facility]  Discharge Summary At the [facility]    Patient ID: Leslie Bradley                                             Discharge Date: 02/10/2018   Patient's PCP: Carolina Cellar, MD                                          Discharge Physician: Hassie Bruce MD  Admit Date: 02/08/2018   Admitting Physician: Hassie Bruce, MD    DISCHARGE DIAGNOSES:   Acute Metabolic Encephalopathy 2/2 UTI  Urinary Tract Infection    Chronic Medical Diagnoses:  HTN  Depression    Hospital Course:    Leslie Bradley is a 85 y.o. female with PMH HTN, breast cancer who presents with altered mental status. She initially was noted by family to become more confused yesterday night. Her son is a Insurance account manager and spoke to her on the phone. She has baseline dementia but he felt that she was different from her baseline and forgetting things she usually remembers. This morning she had another episode of confusion and was forgetting things she usually remembers. Her daughter brought her in from an independent living facility. Her daughter noted that she had not taken any of her medications for the past few days, including her Effexor which was increased in dose 2 months ago. She endorses some back discomfort but denies fevers, chills, cough, abdominal pain, dysuria, increased urinary frequency. Of note, patient also had a recent mechanical fall 01/19/18 where she fell on her left side. She presented to the ED at that time and CT Head was unremarkable.   ??  In the ED, the patient was afebrile, patient hypertensive to 192/82. Workup including CBC, BMP, troponins, rapid flu were unremarkable. CT Head negative for acute process. UA positive for 50-100 WBC, small leukocyte esterase, 2+ bacteria.    During her admission, patient was treated with 3-days of rocephin with subsequent improvement in her mental status. Urine culture grew normal urogenital flora. Patient was ambulating without assistance and  clinically improved on discharge. She was instructed to follow-up with her PCP in 1 week.    The following issues were addressed during hospitalization:    Physical Exam:  BP (!) 165/88    Pulse 90    Temp 98 ??F (36.7 ??C) (Oral)    Resp 16    Ht 5\' 6"  (1.676 m)    Wt 195 lb (88.5 kg)    SpO2 91%    BMI 31.47 kg/m??   General appearance: alert, appears stated age and cooperative  Head: Normocephalic, atraumatic  Eyes: conjunctivae/corneas clear. PERRL  Neck: supple, no JVD  Lungs: clear to auscultation bilaterally  Heart: regular rate and rhythm, S1, S2 normal, no murmur, click, rub or gallop  Abdomen: soft, non-tender; bowel sounds normal; no masses,  no organomegaly  Extremities: extremities normal, atraumatic, no cyanosis or edema  Neurologic: Grossly normal, alert and oriented x 3    Significant Diagnostic Studies:   Labs:  For convenience and continuity at follow-up the following most recent labs are provided:  CBC:    Lab Results   Component Value Date    WBC 7.2 02/10/2018  HGB 12.3 02/10/2018    HCT 37.1 02/10/2018    PLT 255 02/10/2018     Renal:    Lab Results   Component Value Date    NA 139 02/10/2018    K 3.7 02/10/2018    CL 100 02/10/2018    CO2 26 02/10/2018    BUN 17 02/10/2018    CREATININE 0.7 02/10/2018    CALCIUM 9.0 02/10/2018       Consults: IP CONSULT TO HOSPITALIST  IP CONSULT TO RESIDENT INTERNAL MEDICINE  Disposition: Independent living  Discharged Condition: Stable  Follow Up: Primary Care Physician in 1 week    DISCHARGE MEDICATION:     Medication List      CONTINUE taking these medications    * amLODIPine 5 MG tablet  Commonly known as:  NORVASC  Take 1 tablet by mouth daily With 2.5 mg for 7.5mg  total daily     * amLODIPine 2.5 MG tablet  Commonly known as:  NORVASC  Take 1 tablet by mouth daily With 5mg  tablet for 7.5 mg total daily     B-12 PO     BIOTIN FORTE PO     FEXOFENADINE HCL PO     MULTIVITAMIN ADULT PO     tiZANidine 2 MG tablet  Commonly known as:  ZANAFLEX  Take 1 tablet  by mouth every 8 hours as needed (muscle spasm)     venlafaxine 100 MG tablet  Commonly known as:  EFFEXOR  TAKE 1 TABLET BY MOUTH TWICE A DAY         * This list has 2 medication(s) that are the same as other medications prescribed for you. Read the directions carefully, and ask your doctor or other care provider to review them with you.              Activity: Activity as tolerated  Diet: DIET GENERAL;    Time Spent on discharge is more than 45 minutes    Signed:  Griselda Miner,  MD, PGY-1  02/10/2018    Addendum to Resident H& P/Progress note:  I have personally seen,examined and evaluated the patient. I have reviewed the current history, physical findings, labs and assessment and plan and agree with note as documented by resident MD ( Dr.Go)      Hassie Bruce, MD, Jerrel Ivory

## 2018-02-10 NOTE — Progress Notes (Signed)
Patient discharged. Taken to private vehicle via wheelchair. Patient alert and oriented x 4. Patient and daughter verbalized understanding of discharge instructions. Verbal and written instructions given. IV discontinued. Tip intact and 2 x 2 pressure dressing applied. Patient belongings sent with patient. Leslie Bradley  '

## 2018-02-10 NOTE — Plan of Care (Addendum)
Problem: Falls - Risk of:  Goal: Will remain free from falls  Description  Will remain free from falls. Fall precautions in place. Bed is in lowest position, wheels locked and alarm on. Non-skid socks on. Call light and bedside table within reach. Pt calls out appropriately. Pt is up ad lib. Will continue to assess and monitor.   02/10/2018 0230 by Pryor Montes, RN  Outcome: Ongoing       Problem: Pain:  Goal: Pain level will decrease  Description  Pain level will decrease. Patient uses 0-10 pain scale. Denies pain at this time. Encouraged to call out if in pain. Will continue to assess and monitor.    02/10/2018 0230 by Pryor Montes, RN  Outcome: Ongoing

## 2018-02-10 NOTE — Progress Notes (Signed)
Patient currently resting in bed. Anticipating discharge today. Patient alert and oriented x 4 and in no distress. Patient up as tolerated without assistance and tolerating well. Nurse will continue to monitor. Non-skid footwear in place. Call light within reach. Leslie Bradley

## 2018-02-10 NOTE — Care Coordination-Inpatient (Signed)
Case Management Assessment            Discharge Note                    Date / Time of Note: 02/10/2018 12:18 PM                  Discharge Note Completed by: Archie Patten     Spoke with patient regarding discharge needs and pt denies needs at d/c. Daughter was present and said that family is helping with medications and daughter will transport to home.     Patient Name: Leslie Bradley   Date of Birth: 02/26/1928  Diagnosis: Altered mental status [R41.82]  Altered mental status [R41.82]   Date / Time: 02/08/2018  1:10 PM    Current PCP: Carolina Cellar, MD  Clinic patient: No    Hospitalization in the last 30 days: No    Advance Directives:  Code Status: Full Code  [place] DNR form completed and on chart: No    Financial:  Payor: [employer] MEDICARE / Plan: [place] PPO / Product Type: Medicare /      Pharmacy:    [facility] [phone] [facility] [employer] [place], [place] - [address] [place] [phone] - F [phone]  [address]  [place] [address]  Phone: [phone] Fax: [phone]      Assistance purchasing medications?: Potential Assistance Purchasing Medications: No  Assistance provided by Case Management: None at this time    Does patient want to participate in local refill/ meds to beds program?: Yes    Meds To Beds General Rules:  1. Can ONLY be done Monday- Friday between 8:30am-5pm  2. Prescription(s) must be in pharmacy by 3pm to be filled same day  3.Copy of patient's insurance/ prescription drug card and patient face sheet must be sent along with the prescription(s)  4. Cost of Rx cannot be added to hospital bill. If financial assistance is needed, please contact unit case manager or Child psychotherapist; Case manager or Social Worker CANNOT provide pharmacy voucher for patients co-pays  5. Patients can then pick up the prescription on their way out of the hospital at discharge, or pharmacy can deliver to the bedside if staff is available. (payment due at time of pick-up or delivery -  cash, check, or card accepted)     Able to afford home medications/ co-pay costs: Yes    ADLS:  Current PT AM-PAC Score:   /24  Current OT AM-PAC Score:   /24      DISCHARGE Disposition: Home- No Services Needed    LOC at discharge: Not Applicable  COC Completed: Not Indicated    Notification completed in HENS/PAS?:  Not Applicable    IMM Completed:   Not Indicated    Transportation:  Transportation PLAN for discharge: family   Mode of Transport: Pensions consultant for Transition of Care is related to the following treatment goals of Altered mental status [R41.82]  Altered mental status [R41.82]    The Patient and/or patient representative Kahlina and her family were provided with a choice of provider and agrees with the discharge plan Not Indicated    Freedom of choice list was provided with basic dialogue that supports the patient's individualized plan of care/goals and shares the quality data associated with the providers. Not Indicated    Care Transitions patient: No    Archie Patten, RN  [facility]  Case  Management Department  Ph: [phone]  Fax: [phone]

## 2018-02-10 NOTE — Progress Notes (Addendum)
Progress Note    Admit Date: 02/08/2018  Day: 2  Diet: DIET GENERAL;    CC:   Chief Complaint   Patient presents with   ??? Altered Mental Status   ??? Hypertension       Interval history: No acute events overnight. Patient states they are feeling much better compared to when she came in. She feels her confusion has improved and states she "feels ready to go home". Patient is tolerating PO and having regular bowel movements. Patient denies fevers, chills, chest pain, palpitations, leg swelling, abdominal pain, nausea, vomiting, diarrhea.    Medications:     Scheduled Meds:  ??? amLODIPine  7.5 mg Oral Daily   ??? venlafaxine  100 mg Oral BID   ??? sodium chloride flush  10 mL Intravenous 2 times per day   ??? enoxaparin  40 mg Subcutaneous Daily   ??? cefTRIAXone (ROCEPHIN) IV  1 g Intravenous Q24H     Continuous Infusions:  ??? sodium chloride Stopped (02/08/18 1737)     PRN Meds:labetalol, tiZANidine, sodium chloride flush, magnesium hydroxide, ondansetron, acetaminophen    Objective:   Vitals:   T-max:  Patient Vitals for the past 8 hrs:   BP Temp Temp src Pulse Resp SpO2   02/10/18 0725 (!) 165/88 98 ??F (36.7 ??C) Oral 90 16 91 %   02/10/18 0350 (!) 172/85 98.4 ??F (36.9 ??C) Oral 93 18 92 %       Intake/Output Summary (Last 24 hours) at 02/10/2018 0851  Last data filed at 02/10/2018 0600  Gross per 24 hour   Intake 720 ml   Output --   Net 720 ml       Physical Exam  Vitals signs and nursing note reviewed. Exam conducted with a chaperone present.   Constitutional:       Comments: Laying in bed, no apparent distress    HENT:      Head: Atraumatic.   Eyes:      Conjunctiva/sclera: Conjunctivae normal.      Pupils: Pupils are equal, round, and reactive to light.   Neck:      Musculoskeletal: Neck supple.   Cardiovascular:      Rate and Rhythm: Normal rate and regular rhythm.      Heart sounds: Normal heart sounds. No murmur.   Pulmonary:      Effort: Pulmonary effort is normal.      Breath sounds: Normal breath sounds. No wheezing.    Abdominal:      General: Bowel sounds are normal.      Palpations: Abdomen is soft.      Tenderness: There is no tenderness.   Skin:     General: Skin is warm and dry.      Capillary Refill: Capillary refill takes less than 2 seconds.   Neurological:      General: No focal deficit present.      Mental Status: She is alert.      Cranial Nerves: No cranial nerve deficit.      Comments: Cognitive slowing, alert and oriented x 4           LABS:    CBC:   Recent Labs     02/08/18  1401 02/09/18  0518 02/10/18  0617   WBC 8.0 7.8 7.2   HGB 13.0 12.7 12.3   HCT 39.2 38.5 37.1   PLT 273 257 255   MCV 94.4 94.3 94.2     Renal:    Recent  Labs     02/08/18  1401 02/09/18  0518 02/10/18  0617   NA 139 140 139   K 3.9 3.6 3.7   CL 98* 101 100   CO2 28 26 26    BUN 20 14 17    CREATININE 0.7 0.6 0.7   GLUCOSE 131* 111* 94   CALCIUM 9.6 8.9 9.0   ANIONGAP 13 13 13      Hepatic: No results for input(s): AST, ALT, BILITOT, BILIDIR, PROT, LABALBU, ALKPHOS in the last 72 hours.  Troponin:   Recent Labs     02/08/18  1401   TROPONINI <0.01     -----------------------------------------------------------------  RAD:   XR CHEST STANDARD (2 VW)   Final Result      Clear lungs.      Tortuous descending thoracic aorta.      Normal cardiac silhouette.      CT Head WO Contrast   Final Result      Sinus disease as above.      Atrophy and white matter ischemic change.      No acute hemorrhage or mass effect identified.                           Assessment/Plan:     Leslie Bradley??is a 85 y.o.??female??with PMH HTN, breast cancer who presents with altered mental status.  ??  Acute Metabolic Encephalopathy likely 2/2 UTI-  UA showing 50-100 WBC, 2+ bacteria, small leuk esterase.??Could potentially be due to Effexor withdrawal as patient has not been taking it over the past few days.  - Continue rocephin  - Urine culture showing urogenital flora  - continue home effexor  - PT/OT consulted  ??  Chronic Medical Problems:  HTN: Continue home  amlodipine  Depression: Continue home effexor    Code Status: Full Code  FEN: DIET GENERAL;  PPX: Lovenox  DISPO: GMF    Leslie Bradley, PGY-1  02/10/18  8:51 AM    This patient has been staffed and discussed with Hassie Bruce, MD.     Addendum to Resident H& P/Progress note:  I have personally seen,examined and evaluated the patient. I have reviewed the current history, physical findings, labs and assessment and plan and agree with note as documented by resident MD ( Dr.Go)      Hassie Bruce, MD, Jerrel Ivory

## 2018-02-11 NOTE — Telephone Encounter (Signed)
[facility] Transitions Initial Follow Up Call    Outreach made within 2 business days of discharge: Yes    Patient: Leslie Bradley Patient DOB: [birth year]   MRN: <[id number]>  Reason for Admission: There are no discharge diagnoses documented for the most recent discharge.  Discharge Date: 02/10/18       Spoke with: Mailbox full    Discharge department/facility: [facility]    TCM Interactive Patient Contact:  Was patient able to fill all prescriptions: No vm  Was patient instructed to bring all medications to the follow-up visit: No vm  Is patient taking all medications as directed in the discharge summary? No Vm  Does patient understand their discharge instructions: No vm  Does patient have questions or concerns that need addressed prior to 7-14 day follow up office visit: no    Scheduled appointment with PCP within 7-14 days    Follow Up  Mailbox is full,2nd attempt(vm full) 3rd attempt. D/c  Future Appointments   Date Time Provider Department Center   05/06/2018  2:40 PM Carolina Cellar, MD [facility]       Melenie Minniear Bryan Lemma, [place]

## 2018-02-12 NOTE — Telephone Encounter (Signed)
Unable to reach patient's daughter after 3 attempts. Voice mailbox is full. Will send out letter with instructions on how to follow up if needed.      CLINICAL PHARMACY NOTE  Post-Discharge Transitions of Care (TOC)    Subjective/Objective:  Leslie Bradley is a 85 y.o. y.o. female. Patient was discharged from [facility] on 02/10/18 with a diagnosis of acute metabolic encephalopathy 2/2 UTI.    Thank you,    Roselie Awkward, PharmD Candidate  [facility]  Dept: [phone], option 7

## 2018-02-12 NOTE — Telephone Encounter (Signed)
Tried calling patient's daughter's phone to complete transitions of care outreach. Voice mailbox was full.       Roselie Awkward, PharmD Candidate 2020  [facility]  Dept: [phone], option 7

## 2018-02-12 NOTE — Telephone Encounter (Signed)
Kimberly,  Patient was discharged from [facility].    Please attempt to contact for medication review/reconciliation.     Thank you,  Fryda Molenda, PharmD, BCACP  [facility]  Direct: [phone]  Department, toll free: [phone], option 7

## 2018-02-13 NOTE — Telephone Encounter (Signed)
Letter pended for mail.    Reviewed and agree with PharmD candidate  Rubie Maid, PharmD, [facility]  Direct: [phone]  Department, toll free: [phone], option 7     =======================================================   For Pharmacy Admin Tracking Only    TCM Call Made?: Yes  [facility] Patient?: Yes  Outreach Status: Patient Unreachable  Care Coordinator Outreach to Patient?: No  Time Spent (min): 5

## 2018-02-25 ENCOUNTER — Encounter: Attending: Psychologist | Primary: Internal Medicine

## 2018-03-19 NOTE — Telephone Encounter (Signed)
Appointment made

## 2018-03-19 NOTE — Telephone Encounter (Signed)
Patient daughter called stating that her mother is telling her she is confused and very tired, no energy.  She would like to bring her in for an appointment as soon as possible.      Please call to advise.

## 2018-03-19 NOTE — Telephone Encounter (Signed)
Can she come in tomorrow?

## 2018-03-20 ENCOUNTER — Ambulatory Visit: Admit: 2018-03-20 | Discharge: 2018-03-20 | Payer: MEDICARE | Attending: Internal Medicine | Primary: Internal Medicine

## 2018-03-20 ENCOUNTER — Encounter

## 2018-03-20 DIAGNOSIS — R5383 Other fatigue: Secondary | ICD-10-CM

## 2018-03-20 LAB — POCT URINALYSIS DIPSTICK
Bilirubin, UA: NEGATIVE
Blood, UA POC: NEGATIVE
Glucose, UA POC: NEGATIVE
Ketones, UA: NEGATIVE
Leukocytes, UA: NEGATIVE
Nitrite, UA: NEGATIVE
Protein, UA POC: NEGATIVE
Spec Grav, UA: 1.01
Urobilinogen, UA: 0.2
pH, UA: 6

## 2018-03-20 NOTE — Progress Notes (Signed)
03/20/2018     Leslie Bradley (DOB:  Jul 10, 1928) is a 85 y.o. female, here for evaluation of the following medical concerns:    Chief Complaint   Patient presents with   ??? Fatigue     pt states just feels something is wrong, very tired, frequency         HPI    For about the last week patient has been having more fatigue.  Daughter is concerned it could be a UTI, it seems very similar to prior UTI.  She has been taking her Effexor consistently, missed a couple of doses in the last couple of weeks but her daughter puts her pillbox together every week and is able to keep track of what she is missing.  Patient does think that she is still very depressed.  Has been having some urinary frequency.  No cough, fevers or chills, change in appetite.    Review of Systems   Genitourinary: Negative for dysuria.   Psychiatric/Behavioral: Positive for dysphoric mood.       Prior to Visit Medications    Medication Sig Taking? Authorizing Provider   venlafaxine (EFFEXOR) 100 MG tablet TAKE 1 TABLET BY MOUTH TWICE A DAY Yes Carolina Cellar, MD   Multiple Vitamins-Minerals (MULTIVITAMIN ADULT PO) Take by mouth Yes Historical Provider, MD   amLODIPine (NORVASC) 5 MG tablet Take 1 tablet by mouth daily With 2.5 mg for 7.5mg  total daily Yes Carolina Cellar, MD   FEXOFENADINE HCL PO Take by mouth Yes Historical Provider, MD   BIOTIN FORTE PO Take by mouth Yes Historical Provider, MD   Cyanocobalamin (B-12 PO) Take by mouth Yes Historical Provider, MD        Past Medical History:   Diagnosis Date   ??? Breast cancer (HCC)    ??? Hypertension        Past Surgical History:   Procedure Laterality Date   ??? BREAST LUMPECTOMY         Social History     Tobacco Use   ??? Smoking status: Never Smoker   ??? Smokeless tobacco: Never Used   Substance Use Topics   ??? Alcohol use: Never     Frequency: Never        No family history on file.    Vitals:    03/20/18 1314   BP: 128/70   Site: Right Upper Arm   Weight: 194 lb (88 kg)   Height: 5\' 6"  (1.676 m)      Estimated body mass index is 31.31 kg/m?? as calculated from the following:    Height as of this encounter: 5\' 6"  (1.676 m).    Weight as of this encounter: 194 lb (88 kg).    Physical Exam  Vitals signs reviewed.   Constitutional:       General: She is not in acute distress.     Appearance: She is well-developed.   HENT:      Head: Normocephalic and atraumatic.   Cardiovascular:      Rate and Rhythm: Normal rate and regular rhythm.      Heart sounds: Normal heart sounds.   Pulmonary:      Effort: Pulmonary effort is normal. No respiratory distress.      Breath sounds: Normal breath sounds.   Skin:     General: Skin is warm and dry.   Neurological:      Mental Status: She is alert. Mental status is at baseline.   Psychiatric:  Behavior: Behavior normal.         Thought Content: Thought content normal.         Cognition and Memory: Memory is impaired.         Judgment: Judgment normal.         ASSESSMENT/PLAN:  1. Fatigue, unspecified type  -We will check blood work for reversible underlying abnormality, I suspect however this is due to depression and the daughter and patient agree.  The Effexor is likely helping but not enough.  - Vitamin B12; Future  - CBC Auto Differential; Future  - TSH with Reflex; Future    2. Major depressive disorder, remission status unspecified, unspecified whether recurrent  -Refer to Dr. Ericka Pontiff for further management of depression.  She has a long history and has had multiple treatments in the past.  - Ambulatory referral to Psychiatry    3. Urine frequency  -UA done in clinic, no signs of infection  - POCT Urinalysis no Micro      No follow-ups on file.

## 2018-03-21 LAB — CBC WITH AUTO DIFFERENTIAL
Basophils %: 0.6 %
Basophils Absolute: 0 10*3/uL (ref 0.0–0.2)
Eosinophils %: 0.8 %
Eosinophils Absolute: 0 10*3/uL (ref 0.0–0.6)
Hematocrit: 39 % (ref 36.0–48.0)
Hemoglobin: 13 g/dL (ref 12.0–16.0)
Lymphocytes %: 23.3 %
Lymphocytes Absolute: 1.4 10*3/uL (ref 1.0–5.1)
MCH: 31.1 pg (ref 26.0–34.0)
MCHC: 33.4 g/dL (ref 31.0–36.0)
MCV: 93.3 fL (ref 80.0–100.0)
MPV: 8.6 fL (ref 5.0–10.5)
Monocytes %: 13.7 %
Monocytes Absolute: 0.8 10*3/uL (ref 0.0–1.3)
Neutrophils %: 61.6 %
Neutrophils Absolute: 3.6 10*3/uL (ref 1.7–7.7)
Platelets: 256 10*3/uL (ref 135–450)
RBC: 4.19 M/uL (ref 4.00–5.20)
RDW: 13.9 % (ref 12.4–15.4)
WBC: 5.8 10*3/uL (ref 4.0–11.0)

## 2018-03-21 LAB — TSH WITH REFLEX: TSH: 2.59 u[IU]/mL (ref 0.27–4.20)

## 2018-03-21 LAB — VITAMIN B12: Vitamin B-12: 627 pg/mL (ref 211–911)

## 2018-04-01 ENCOUNTER — Encounter: Attending: Psychologist | Primary: Internal Medicine

## 2018-04-02 MED ORDER — VENLAFAXINE HCL 100 MG PO TABS
100 MG | ORAL_TABLET | ORAL | 2 refills | Status: DC
Start: 2018-04-02 — End: 2018-04-11

## 2018-04-11 ENCOUNTER — Ambulatory Visit: Admit: 2018-04-11 | Discharge: 2018-04-11 | Payer: MEDICARE | Attending: Internal Medicine | Primary: Internal Medicine

## 2018-04-11 DIAGNOSIS — R410 Disorientation, unspecified: Secondary | ICD-10-CM

## 2018-04-11 LAB — POCT URINALYSIS DIPSTICK
Bilirubin, UA: 0
Blood, UA POC: 0
Glucose, UA POC: 0
Ketones, UA: 0
Nitrite, UA: 0
Spec Grav, UA: 1.005
Urobilinogen, UA: 0
pH, UA: 6

## 2018-04-11 MED ORDER — AMLODIPINE BESYLATE 5 MG PO TABS
5 MG | ORAL_TABLET | Freq: Every day | ORAL | 1 refills | Status: DC
Start: 2018-04-11 — End: 2018-09-09

## 2018-04-11 MED ORDER — VENLAFAXINE HCL 100 MG PO TABS
100 MG | ORAL_TABLET | ORAL | 2 refills | Status: DC
Start: 2018-04-11 — End: 2018-09-09

## 2018-04-11 MED ORDER — SULFAMETHOXAZOLE-TRIMETHOPRIM 400-80 MG PO TABS
400-80 MG | ORAL_TABLET | Freq: Every day | ORAL | 0 refills | Status: AC
Start: 2018-04-11 — End: 2018-04-14

## 2018-04-11 MED ORDER — AMLODIPINE BESYLATE 2.5 MG PO TABS
2.5 MG | ORAL_TABLET | Freq: Every day | ORAL | 1 refills | Status: DC
Start: 2018-04-11 — End: 2018-09-09

## 2018-04-11 NOTE — Telephone Encounter (Signed)
Pt. Is going through a bit of confusion, she recently had an UTI

## 2018-04-11 NOTE — Progress Notes (Signed)
04/11/2018     Leslie Bradley (DOB:  August 20, 1928) is a 85 y.o. female, here for evaluation of the following medical concerns:    Chief Complaint   Patient presents with   ??? Altered Mental Status     recurring spells with confusion x6 mo.        HPI    She has been having increased confusion in the last couple of days.  Seem to be okay on Monday and Tuesday, then slept a significant amount of the day on Wednesday and then seem more confused, not sure where she was.  There do not appear to been any other issues with medications.  She denies any cough, shortness of breath, fever, chills, dysuria, urinary frequency.  It seems to have improved a bit.    Review of Systems   Constitutional: Negative for chills and fever.       Prior to Visit Medications    Medication Sig Taking? Authorizing Provider   amLODIPine (NORVASC) 5 MG tablet Take 1 tablet by mouth daily With 2.5 mg for 7.5mg  total daily Yes Carolina Cellar, MD   venlafaxine (EFFEXOR) 100 MG tablet TAKE 1 TABLET BY MOUTH TWICE A DAY Yes Vonzella Nipple Rocket Gunderson, MD   amLODIPine (NORVASC) 2.5 MG tablet Take 1 tablet by mouth daily Yes Carolina Cellar, MD   sulfamethoxazole-trimethoprim (BACTRIM) 400-80 MG per tablet Take 1 tablet by mouth daily for 3 days Yes Carolina Cellar, MD   Multiple Vitamins-Minerals (MULTIVITAMIN ADULT PO) Take by mouth Yes Historical Provider, MD   FEXOFENADINE HCL PO Take by mouth Yes Historical Provider, MD   BIOTIN FORTE PO Take by mouth Yes Historical Provider, MD   Cyanocobalamin (B-12 PO) Take by mouth Yes Historical Provider, MD        Past Medical History:   Diagnosis Date   ??? Breast cancer (HCC)    ??? Hypertension        Past Surgical History:   Procedure Laterality Date   ??? BREAST LUMPECTOMY         Social History     Tobacco Use   ??? Smoking status: Never Smoker   ??? Smokeless tobacco: Never Used   Substance Use Topics   ??? Alcohol use: Never     Frequency: Never        No family history on file.    Vitals:    04/11/18 1441 04/11/18 1444   BP: (!) 146/84  (!) 144/84   Weight: 194 lb (88 kg)    Height: 5\' 6"  (1.676 m)      Estimated body mass index is 31.31 kg/m?? as calculated from the following:    Height as of this encounter: 5\' 6"  (1.676 m).    Weight as of this encounter: 194 lb (88 kg).    Physical Exam  Vitals signs reviewed.   Constitutional:       General: She is not in acute distress.     Appearance: She is well-developed.   HENT:      Head: Normocephalic and atraumatic.   Cardiovascular:      Rate and Rhythm: Normal rate and regular rhythm.      Heart sounds: Normal heart sounds.   Pulmonary:      Effort: Pulmonary effort is normal. No respiratory distress.      Breath sounds: Normal breath sounds.   Skin:     General: Skin is warm and dry.   Neurological:      Mental Status:  She is alert. Mental status is at baseline.   Psychiatric:         Behavior: Behavior normal.         Thought Content: Thought content normal.         Judgment: Judgment normal.         ASSESSMENT/PLAN:  1. Confusion  -No clear trigger.  Will check for urinary tract infection.  We just check blood work last month for a not to similar episode, it was normal so at this point would not recheck unless there is any focal finding.  I do think some of this may be waxing and waning of cognitive decline.  She has no formal dementia diagnosis though I suspect there is some of that underlying.  Discussed with daughter having more formal neurocognitive testing at a memory care clinic, she agrees with this    2. Memory loss  -Refer to memory clinic  - Amb External Referral To Neurology    3. Urinary tract infection without hematuria, site unspecified  -UA showed leukocytes, will treat empirically with Bactrim DS 1 tab twice daily for 3 days, send urine for culture  - POCT Urinalysis no Micro  - Culture, Urine      No follow-ups on file.

## 2018-04-13 LAB — CULTURE, URINE: Urine Culture, Routine: NO GROWTH

## 2018-05-06 ENCOUNTER — Encounter: Payer: MEDICARE | Attending: Internal Medicine | Primary: Internal Medicine

## 2018-06-05 ENCOUNTER — Telehealth: Payer: Self-pay | Admitting: *Deleted

## 2018-06-05 NOTE — Telephone Encounter (Signed)
Called to r/s awv or offer virtual unable to leave message

## 2018-06-25 ENCOUNTER — Ambulatory Visit: Payer: Medicare HMO

## 2018-08-12 ENCOUNTER — Telehealth

## 2018-08-12 NOTE — Telephone Encounter (Signed)
Referral placed

## 2018-08-12 NOTE — Telephone Encounter (Signed)
The patient daughter is requesting a referral to a memory doctor in [place], [place], [place] ph# [phone], fx# [phone]

## 2018-09-06 NOTE — Telephone Encounter (Signed)
Patient son Gala Romney states the patient has dementia and the Neurologist she saw has recommended she has psychological testing done and a pre-cert and referral is need.     The provider is Shea Stakes NPI [id number] and she is requesting to perform cpt 96116, [id number], [id number], [id number], [id number], [phone].     Address is [address] [place] [address]   Ph. [phone]    Please advise.

## 2018-09-06 NOTE — Telephone Encounter (Signed)
med refill request    amLODIPine (NORVASC) 5 MG tablet    amLODIPine (NORVASC) 2.5 MG tablet     venlafaxine (EFFEXOR) 100 MG tablet     [facility] [address] [place], [place] - [address] - P [phone] - F [phone]

## 2018-09-09 MED ORDER — AMLODIPINE BESYLATE 5 MG PO TABS
5 MG | ORAL_TABLET | Freq: Every day | ORAL | 1 refills | Status: DC
Start: 2018-09-09 — End: 2021-09-21

## 2018-09-09 MED ORDER — VENLAFAXINE HCL 100 MG PO TABS
100 MG | ORAL_TABLET | ORAL | 2 refills | Status: AC
Start: 2018-09-09 — End: ?

## 2018-09-09 MED ORDER — AMLODIPINE BESYLATE 2.5 MG PO TABS
2.5 MG | ORAL_TABLET | Freq: Every day | ORAL | 1 refills | Status: DC
Start: 2018-09-09 — End: 2021-09-21

## 2018-09-09 NOTE — Telephone Encounter (Signed)
What specifically do they need from [place]? Usually the precert for testing is from the ordering office

## 2018-09-12 NOTE — Telephone Encounter (Signed)
Spoke with Gala Romney per [employer] there is no referral needed for CPT codes and services requested I spoke with Wynona Canes today at [facility], provider pt is seeing is in the network wherefore all services are covered

## 2018-10-11 IMAGING — DX DG CERVICAL SPINE COMPLETE 4+V
5 series · 5 of 5 positions shown · non-contrast
Comparison: None.

CLINICAL DATA: Neck pain with radicular symptoms on the right.

EXAM:
CERVICAL SPINE - COMPLETE 4+ VIEW

[c-spine lat]
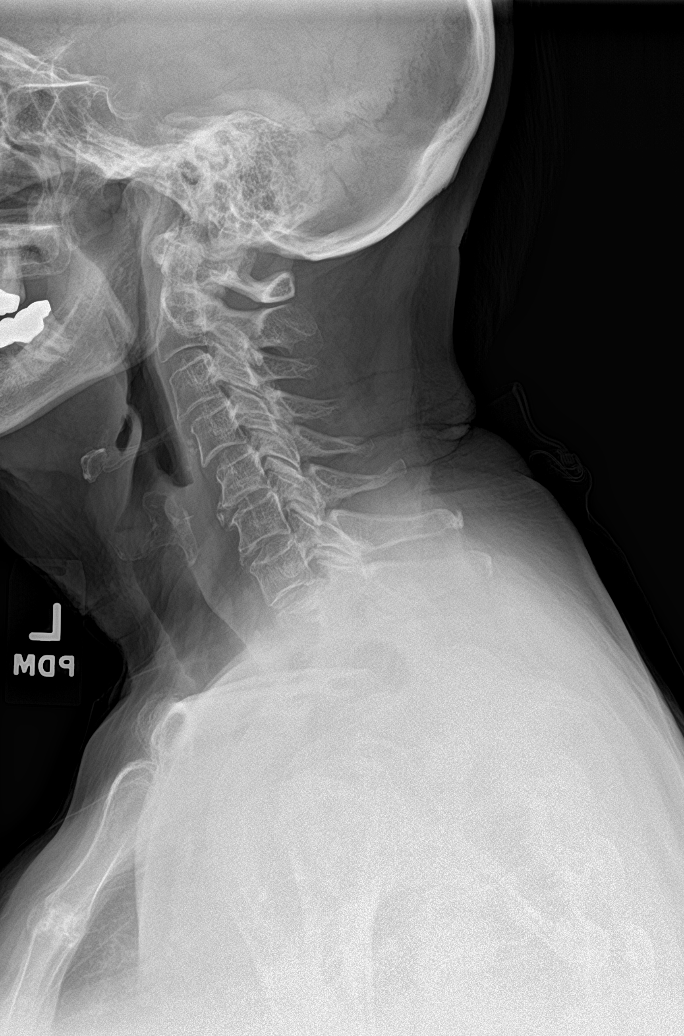

[c-spine obl (1 of 2)]
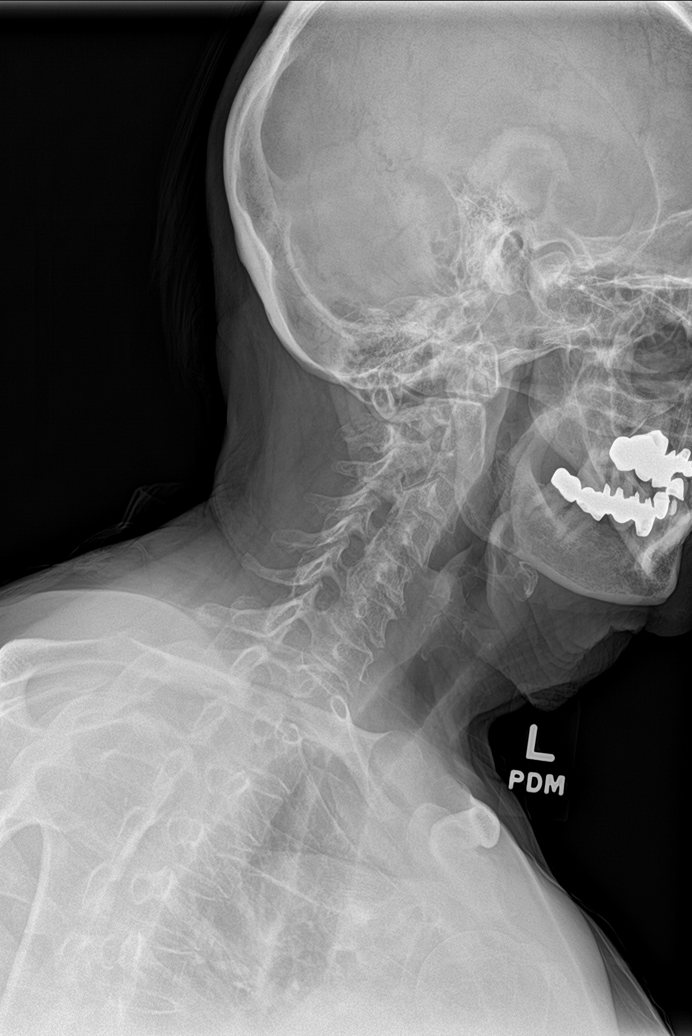

[c-spine obl (2 of 2)]
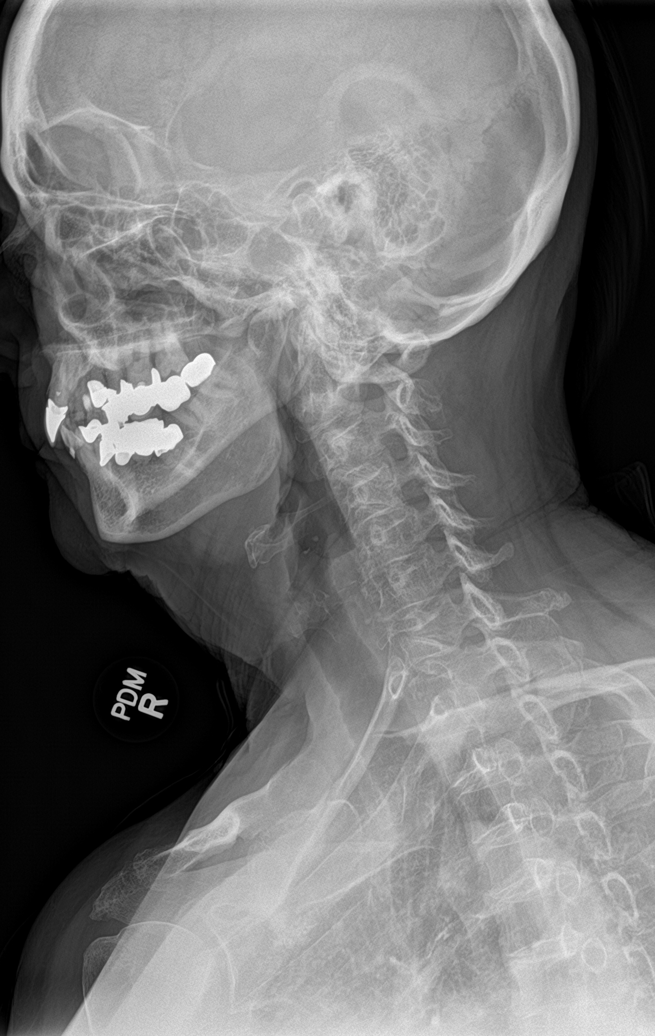

[c-spine ap]
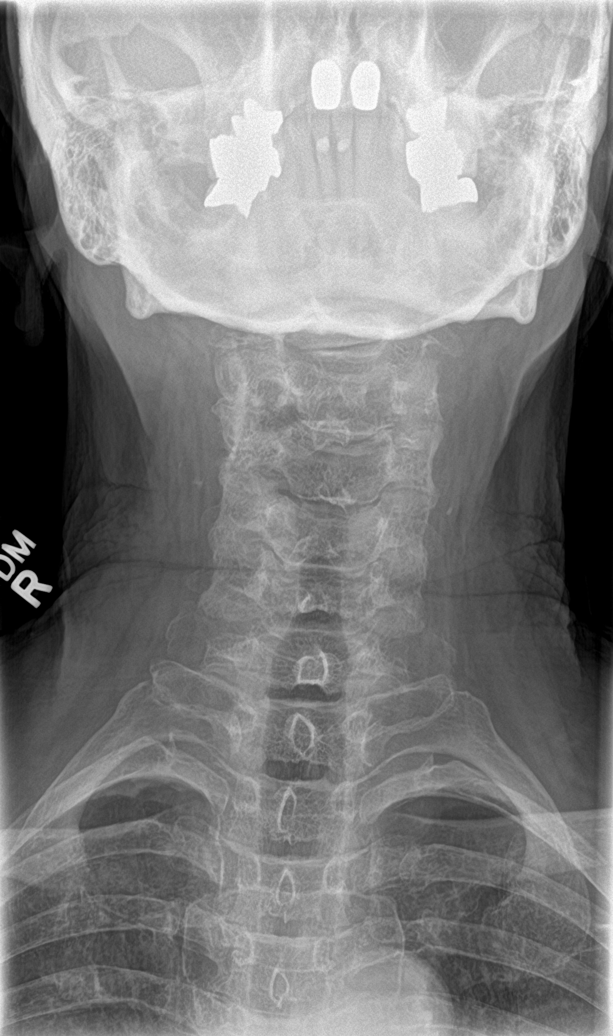

[c-spine open mouth]
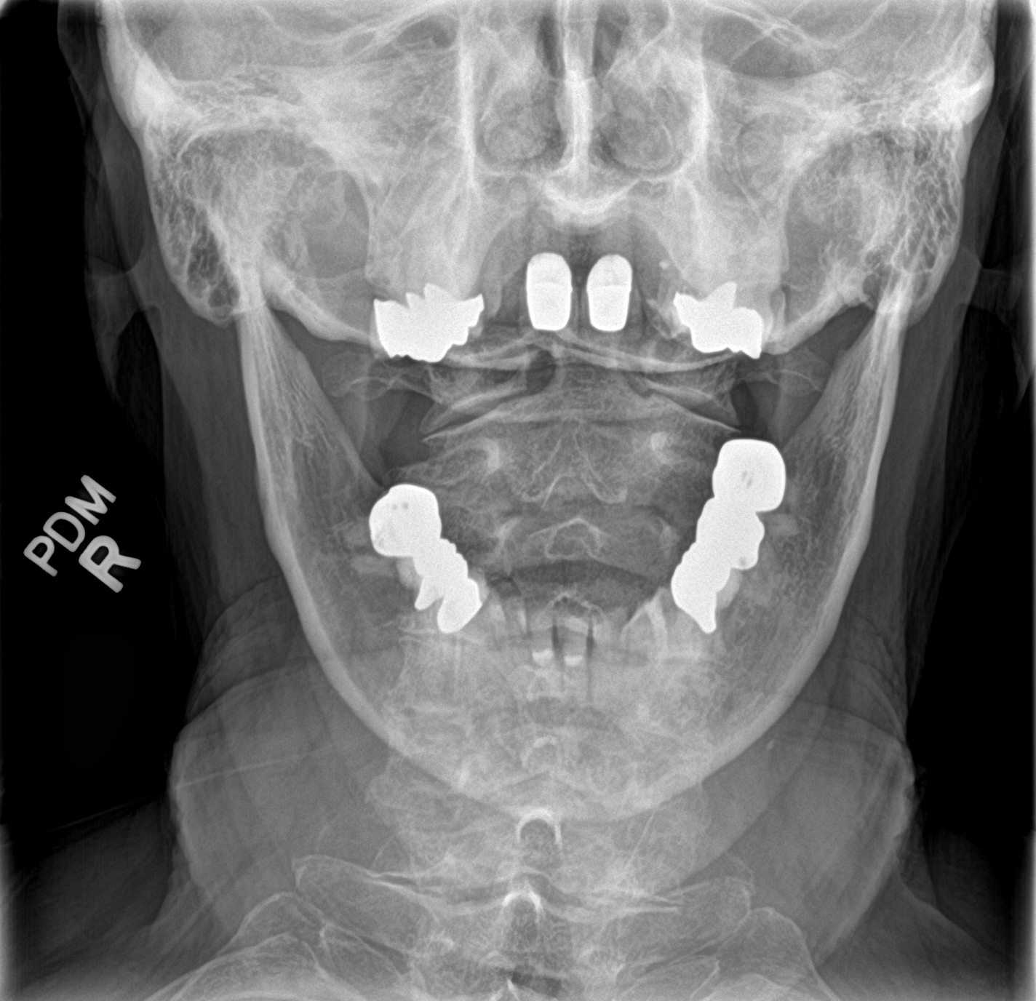

[5 of 5 positions shown; findings below may reference images not displayed]

FINDINGS: Generalized disc narrowing and mild ridging. Borderline
anterolisthesis at C4-5. No notable facet spurring. No visible bony
impingement. No evidence of fracture or bone lesion. No prevertebral
thickening.
IMPRESSION: Overall mild degenerative changes for age. No bony foraminal
stenosis to explain radicular symptoms.

## 2019-02-04 ENCOUNTER — Encounter (INDEPENDENT_AMBULATORY_CARE_PROVIDER_SITE_OTHER): Payer: Self-pay | Admitting: Internal Medicine

## 2019-02-04 ENCOUNTER — Ambulatory Visit (INDEPENDENT_AMBULATORY_CARE_PROVIDER_SITE_OTHER): Payer: Commercial Managed Care - HMO | Admitting: Internal Medicine

## 2019-02-04 DIAGNOSIS — F03A0 Unspecified dementia, mild, without behavioral disturbance, psychotic disturbance, mood disturbance, and anxiety: Secondary | ICD-10-CM

## 2019-02-04 DIAGNOSIS — F0390 Unspecified dementia without behavioral disturbance: Secondary | ICD-10-CM

## 2019-02-04 DIAGNOSIS — K219 Gastro-esophageal reflux disease without esophagitis: Secondary | ICD-10-CM

## 2019-02-04 DIAGNOSIS — I10 Essential (primary) hypertension: Secondary | ICD-10-CM | POA: Insufficient documentation

## 2019-02-04 DIAGNOSIS — F341 Dysthymic disorder: Secondary | ICD-10-CM

## 2019-02-04 LAB — BASIC METABOLIC PANEL
Anion Gap: 8 (ref 5.0–15.0)
BUN: 19 mg/dL (ref 7.0–19.0)
CO2: 31 mEq/L — ABNORMAL HIGH (ref 21–29)
Calcium: 9.3 mg/dL (ref 7.9–10.2)
Chloride: 101 mEq/L (ref 100–111)
Creatinine: 1 mg/dL (ref 0.4–1.5)
Glucose: 96 mg/dL (ref 70–100)
Potassium: 4.6 mEq/L (ref 3.5–5.1)
Sodium: 140 mEq/L (ref 136–145)

## 2019-02-04 LAB — GFR: EGFR: 52.3

## 2019-02-04 LAB — HEMOLYSIS INDEX: Hemolysis Index: 2 (ref 0–18)

## 2019-02-04 MED ORDER — AMLODIPINE BESYLATE 2.5 MG PO TABS
2.5000 mg | ORAL_TABLET | Freq: Every day | ORAL | 1 refills | Status: DC
Start: 2019-02-04 — End: 2019-05-13

## 2019-02-04 MED ORDER — AMLODIPINE BESYLATE 5 MG PO TABS
5.0000 mg | ORAL_TABLET | Freq: Every day | ORAL | 1 refills | Status: DC
Start: 2019-02-04 — End: 2019-05-13

## 2019-02-04 MED ORDER — VENLAFAXINE HCL 100 MG PO TABS
100.0000 mg | ORAL_TABLET | Freq: Every day | ORAL | 1 refills | Status: DC
Start: 2019-02-04 — End: 2019-08-08

## 2019-02-04 NOTE — Patient Instructions (Signed)
It was a pleasure seeing you at the office today!      Lifestyle Changes for Controlling GERD  When you have GERD, stomach acid feels as if it’s backing up toward your mouth. Making lifestyle changes can often improve your symptoms. This is true if you take medicine to control your GERD or not. Talk with your healthcare provider about the following suggestions. They may help you get relief from your symptoms.       Raise your head  Reflux is more likely to happen when you’re lying down flat. That's because stomach fluid can flow backward more easily. Raising the head of your bed 4 to 6 inches can help. To do this:   · Slide blocks or books under the legs at the head of your bed. Or put a wedge under the mattress. Many foam stores can make a wedge for you. The wedge should go from your waist to the top of your head.  · Don’t just prop your head up on a few pillows. This increases pressure on your stomach. It can make GERD worse.  Watch your eating habits  Certain foods may increase the acid in your stomach. Or they may relax the lower esophageal sphincter. This makes GERD more likely. It’s best to avoid the following if they cause you symptoms:   · Coffee, tea, and carbonated drinks (with and without caffeine)  · Fatty, fried, or spicy food  · Mint, chocolate, onions, tomatoes, and citrus  · Peppermint  · Any other foods that seem to irritate your stomach or cause you pain  Relieve the pressure  Tips include the following:  · Eat smaller meals, even if you have to eat more often.  · Don’t lie down right after you eat. Wait a few hours for your stomach to empty.  · Don't wear tight belts or tight-fitting clothes.  · Lose any extra weight.  Tobacco and alcohol  Don't smoke tobacco or drink alcohol. They can make GERD symptoms worse.   [employer]Well last reviewed this educational content on 06/30/2017  © 2000-2020 [employer]. [address], [place] [address]. All rights reserved. This information is  not intended as a substitute for professional medical care. Always follow your healthcare professional's instructions.

## 2019-02-04 NOTE — Progress Notes (Signed)
Have you seen any specialists/other providers since your last visit with us?    New patient    Arm preference verified?   Yes    The patient is due for pneumonia vaccine

## 2019-02-05 ENCOUNTER — Encounter (INDEPENDENT_AMBULATORY_CARE_PROVIDER_SITE_OTHER): Payer: Self-pay | Admitting: Internal Medicine

## 2019-02-05 NOTE — Progress Notes (Signed)
Subjective:      Date: 02/04/2019 11:17 AM   Patient ID: Cheryl Dalton is a 86 y.o. female.    Chief Complaint:  Chief Complaint   Patient presents with    Establish Care     non fasting       HPI:  HPI  Patient is an 86 year old female with medical problems as listed below accompanied by her son today was assisting with a history, presenting to establish care.  Pt also needs medication refill.  Pt has stable essential HTN with no evidence of CHF or proteinuria.  The patient is compliant with anti-hypertensive therapy and denies side effects to therapy.  Pt denies CP, SOB, dizziness, orthopnea, PND or edema.  Pt has hypothyroidism - compliant with current regimen.  Denies side effects to therapy.  Denies any change in hair/nails/skin, constipation, unexplained wt changes.   In addition she has GERD only takes Tums as needed for rhythm.  She admits to upper abdominal discomfort, heartburn, belching.  Also for dysthymic/depressive disorder she is on Effexor and tolerating the current dose without side effects.  No reports of SI/HI.  Per son patient sees a geriatrician at [facility] and was diagnosed with early dementia and remaining stable at this time.  She lives with son.  Also per son she sees a geriatric psych who has been managing her dysthymic disorder.  No SI/HI.    Per son patient will be traveling to spend time with them in [place] and as the time approaches he would like to have a prescription for atovaquone for malaria prophylaxis for her.  Problem List:  Patient Active Problem List   Diagnosis    Mild dementia    Gastroesophageal reflux disease without esophagitis    Essential hypertension    Dysthymic disorder       Current Medications:  Outpatient Medications Marked as Taking for the 02/04/19 encounter (Office Visit) with Kela Millin, MD   Medication Sig Dispense Refill    amLODIPine (NORVASC) 2.5 MG tablet Take 1 tablet (2.5 mg total) by mouth daily In the a.m. 90 tablet 1    amLODIPine  (NORVASC) 5 MG tablet Take 1 tablet (5 mg total) by mouth daily 1 tablet in the morning 90 tablet 1    cyanocobalamin 1000 MCG tablet Take 1,000 mcg by mouth daily SL      UNABLE TO FIND 1000 mcg Vitamin B 12 sublingual      venlafaxine (EFFEXOR) 100 MG tablet Take 1 tablet (100 mg total) by mouth daily 1 tablet daily 90 tablet 1    vitamin D (CHOLECALCIFEROL) 25 MCG (1000 UT) tablet Take 1,000 Units by mouth daily TAKE TABS DAILY      [DISCONTINUED] amLODIPine (NORVASC) 2.5 MG tablet In the a.m.      [DISCONTINUED] amLODIPine (NORVASC) 5 MG tablet 1 tablet in the morning      [DISCONTINUED] venlafaxine (EFFEXOR) 100 MG tablet 1 tablet daily         Allergies:  No Known Allergies    Past Medical History:  Past Medical History:   Diagnosis Date    Hypertension        Past Surgical History:  Past Surgical History:   Procedure Laterality Date    no past surgical history         Family History:  Family History   Family history unknown: Yes       Social History:  Social History     Socioeconomic History  Marital status: Widowed     Spouse name: Not on file    Number of children: Not on file    Years of education: Not on file    Highest education level: Not on file   Occupational History    Not on file   Social Needs    Financial resource strain: Not on file    Food insecurity     Worry: Not on file     Inability: Not on file    Transportation needs     Medical: Not on file     Non-medical: Not on file   Tobacco Use    Smoking status: Former Smoker     Years: 4.00     Types: Cigarettes    Smokeless tobacco: Never Used    Tobacco comment: 1 pack a day   Substance and Sexual Activity    Alcohol use: Never     Frequency: Never    Drug use: Never    Sexual activity: Not Currently   Lifestyle    Physical activity     Days per week: Not on file     Minutes per session: Not on file    Stress: Not on file   Relationships    Social connections     Talks on phone: Not on file     Gets together: Not on  file     Attends religious service: Not on file     Active member of club or organization: Not on file     Attends meetings of clubs or organizations: Not on file     Relationship status: Not on file    Intimate partner violence     Fear of current or ex partner: Not on file     Emotionally abused: Not on file     Physically abused: Not on file     Forced sexual activity: Not on file   Other Topics Concern    Not on file   Social History Narrative    Not on file       The following sections were reviewed this encounter by the provider:   Tobacco   Allergies   Meds   Problems   Med Hx   Surg Hx   Fam Hx          ROS:  General/Constitutional:   Denies Chills. Denies Fatigue. Denies Fever.   Ophthalmologic:   Denies Blurred vision. Denies Eye Pain.   ENT:   Denies Nasal Discharge. Denies Ear pain. Denies Sinus pain.   Endocrine:   Denies Polydipsia. Denies Polyuria.   Respiratory:   Denies Cough. Denies Orthopnea. Denies Shortness of breath. Denies Wheezing.   Cardiovascular:   Denies Chest pain. Denies Chest pain with exertion. Denies Leg Claudication. Denies Palpitations. Denies Swelling in hands/feet.   Gastrointestinal:    As above.  Denies Blood in stool. Denies Constipation. Denies Diarrhea. Denies Nausea. Denies Vomiting.   Genitourinary:   Denies Blood in urine. Denies Frequent urination. Denies Painful urination.   Musculoskeletal:   Denies Leg cramps. Denies Muscle aches.   Skin:   Denies Skin lesion(s).   Neurologic:   Denies Dizziness. Denies Gait abnormality. Denies Headache. Denies Tingling/Numbness.     Objective:   Vitals:  BP 114/77 (BP Site: Left arm, Patient Position: Sitting, Cuff Size: Large)    Pulse 80    Temp 98.3 F (36.8 C) (Oral)    Resp 16    Ht 1.727 m (5\' 8" )  Wt 76.4 kg (168 lb 6.4 oz)    LMP  (LMP Unknown)    SpO2 97%    BMI 25.61 kg/m       Physical Exam:  General Examination:   Physical Exam   GENERAL APPEARANCE: alert, in no acute distress, well developed, well nourished   oriented to time, place, and person.   ORAL CAVITY: normal oropharynx, normal lips, mucosa moist.   NECK/THYROID: neck supple, no neck mass palpated, no thyromegaly.  LYMPH: no cervical/supraclavicular adenopathy   HEART: S1, S2 normal, no murmurs, rubs, gallops, regular rate and rhythm.   LUNGS: normal effort / no distress, normal breath sounds, clear to auscultation  bilaterally, no wheezes, rales, rhonchi.   ABDOMEN:soft, +BS, NT/ND, no masses palpable  EXTREMITIES: No LE edema bilaterally.   NEURO:CN2-12 grossly intact, nonfocal  PSYCH: alert and oriented to time,place and person.   SKIN: moist, no focal rash    Assessment:       1. Mild dementia  -Stable, follow-up with [facility] geriatrician  2. Gastroesophageal reflux disease without esophagitis  -Have recommended lifestyle changes see [facility]  -Also recommended Pepcid, and her son prefers to get this over-the-counter  -Follow-up as needed.  3. Essential hypertension  -Stable, continue amlodipine, refilled( she takes 7.5mg  daily)  - Basic Metabolic Panel  4. Dysthymic disorder  -Stable, continue current medications  -Follow-up with psych at [facility] as needed      Plan:   As above  Return in about 6 months (around 08/04/2019) for HTN.  Orders Placed This Encounter    Basic Metabolic Panel    Hemolysis index    GFR    cyanocobalamin 1000 MCG tablet    vitamin D (CHOLECALCIFEROL) 25 MCG (1000 UT) tablet    venlafaxine (EFFEXOR) 100 MG tablet    amLODIPine (NORVASC) 5 MG tablet    amLODIPine (NORVASC) 2.5 MG tablet           Kela Millin, MD

## 2019-04-03 ENCOUNTER — Other Ambulatory Visit (INDEPENDENT_AMBULATORY_CARE_PROVIDER_SITE_OTHER): Payer: Self-pay | Admitting: Internal Medicine

## 2019-04-21 ENCOUNTER — Encounter (INDEPENDENT_AMBULATORY_CARE_PROVIDER_SITE_OTHER): Payer: Self-pay | Admitting: Internal Medicine

## 2019-05-05 ENCOUNTER — Encounter (INDEPENDENT_AMBULATORY_CARE_PROVIDER_SITE_OTHER): Payer: Self-pay | Admitting: Internal Medicine

## 2019-05-05 ENCOUNTER — Other Ambulatory Visit (INDEPENDENT_AMBULATORY_CARE_PROVIDER_SITE_OTHER): Payer: Self-pay | Admitting: Internal Medicine

## 2019-05-05 DIAGNOSIS — F341 Dysthymic disorder: Secondary | ICD-10-CM

## 2019-05-11 ENCOUNTER — Encounter (INDEPENDENT_AMBULATORY_CARE_PROVIDER_SITE_OTHER): Payer: Self-pay | Admitting: Internal Medicine

## 2019-05-13 ENCOUNTER — Ambulatory Visit (INDEPENDENT_AMBULATORY_CARE_PROVIDER_SITE_OTHER): Payer: Commercial Managed Care - HMO | Admitting: Internal Medicine

## 2019-05-13 ENCOUNTER — Ambulatory Visit (INDEPENDENT_AMBULATORY_CARE_PROVIDER_SITE_OTHER): Payer: Medicare (Managed Care) | Admitting: Internal Medicine

## 2019-05-13 ENCOUNTER — Encounter (INDEPENDENT_AMBULATORY_CARE_PROVIDER_SITE_OTHER): Payer: Self-pay | Admitting: Internal Medicine

## 2019-05-13 VITALS — BP 135/71 | HR 87 | Temp 98.7°F | Resp 16 | Ht 68.0 in | Wt 170.2 lb

## 2019-05-13 DIAGNOSIS — K219 Gastro-esophageal reflux disease without esophagitis: Secondary | ICD-10-CM

## 2019-05-13 DIAGNOSIS — I10 Essential (primary) hypertension: Secondary | ICD-10-CM

## 2019-05-13 MED ORDER — LISINOPRIL-HYDROCHLOROTHIAZIDE 20-12.5 MG PO TABS
1.00 | ORAL_TABLET | Freq: Every day | ORAL | 11 refills | Status: AC
Start: 2019-05-13 — End: ?

## 2019-05-13 NOTE — Progress Notes (Signed)
error 

## 2019-05-13 NOTE — Patient Instructions (Signed)
Discharge Instructions for Gastroesophageal Reflux Disease (GERD)  Gastroesophageal reflux disease (GERD) is when acid flows back from the stomach into the swallowing tube (esophagus).  Home care  These home care steps can help you handle GERD:  · Stay at a healthy weight. Get help to lose any extra pounds (kilograms).  · Don't lie down after meals.  · Don't eat late at night.  · Raise the head of your bed by 4 to 6 inches. You can do this by placing wooden blocks or bed risers under the head of your bed. Or you can put a wedge under the mattress.  · Don't wear tight-fitting clothes.  · Don't eat foods that might bother your stomach, such as:  ? Alcohol  ? Fat  ? Chocolate  ? Caffeine  ? Spearmint or peppermint  ? Citrus and other acidic foods  · Talk with your healthcare provider if you are taking certain medicines. These can make GERD symptoms worse:  ? Calcium channel blockers  ? Theophylline  ? Anticholinergic medicines, such as oxybutynin and benzatropine  · Start an exercise program. Ask your healthcare provider how to get started. Simple activities, such as walking or gardening, can help.  · Break the smoking habit. Join a stop-smoking program to improve your chances of success.  · Limit alcohol intake to no more than 2 drinks a day.  · Take your medicines as directed. Don’t skip doses.  · Don't take over-the-counter nonsteroidal anti-inflammatory drugs (NSAIDs), such as aspirin and ibuprofen, unless your healthcare provider advises them for certain health problems.   · If possible, don't take nitrates (heart medicines, such as nitroglycerin and isosorbide dinitrate).  · Talk with your healthcare provider about treatment if you are pregnant. GERD can happen or get worse during pregnancy.  Follow-up care  Make a check-up visit as directed by our staff.  When to call the healthcare provider  Call your healthcare provider right away if you have:  · Trouble swallowing  · Pain when swallowing  · Feeling of food  caught in your chest or throat  · Pain in the neck, chest, or back  · Heartburn that causes you to vomit  · Vomiting blood  · Black or tarry stools (from digested blood)  · More saliva (watering of the mouth) than usual  · Weight loss of more than 3% to 5% of your total body weight in a month  · Hoarseness or sore throat that won’t go away  · Choking, coughing, or wheezing  [employer]Well last reviewed this educational content on 04/30/2017  © 2000-2020 [employer]. [address], [place] [address]. All rights reserved. This information is not intended as a substitute for professional medical care. Always follow your healthcare professional's instructions.        

## 2019-05-13 NOTE — Progress Notes (Signed)
Have you seen any specialists/other providers since your last visit with [place]?    Yes- Dentist    Arm preference verified?   Yes    The patient is due for   Advance Directive on File  [facility] CARE PLAN LETTER  Medicare Annual Wellness Visit  Shingrix Vaccine 50+ (1)  Pneumonia Vaccine Age 84+

## 2019-05-14 NOTE — Progress Notes (Signed)
Subjective:      Date: 05/13/2019 5:08 PM   Patient ID: Cheryl Dalton is a 86 y.o. female.    Chief Complaint:  Chief Complaint   Patient presents with    discuss blood pressure medication     patient has been experiencing abdominal pain,bloatin       HPI:  HPI  Patient is an 86 year old with medical problems as listed below presenting with complaints of worsening of her GERD symptoms>> about abdominal pain, belching.  States Tums helps with the symptoms.  She is accompanied by her son(physician) who states the symptoms appear to have worsened since she has been on amlodipine, and is requesting changing the medication to another.  +nausea, no vomiting, no melena no hematochezia  Pt has stable essential HTN with no evidence of CHF or proteinuria.  The patient is compliant with anti-hypertensive therapy and denies side effects to therapy.  Pt denies CP, SOB, dizziness, orthopnea, PND or edema.  Problem List:  Patient Active Problem List   Diagnosis    Mild dementia    Gastroesophageal reflux disease without esophagitis    Essential hypertension    Dysthymic disorder       Current Medications:  Outpatient Medications Marked as Taking for the 05/13/19 encounter (Office Visit) with Kela Millin, MD   Medication Sig Dispense Refill    cyanocobalamin 1000 MCG tablet Take 1,000 mcg by mouth daily SL      venlafaxine (EFFEXOR) 100 MG tablet Take 1 tablet (100 mg total) by mouth daily 1 tablet daily 90 tablet 1    vitamin D (CHOLECALCIFEROL) 25 MCG (1000 UT) tablet Take 1,000 Units by mouth daily TAKE TABS DAILY      [DISCONTINUED] amLODIPine (NORVASC) 2.5 MG tablet Take 1 tablet (2.5 mg total) by mouth daily In the a.m. 90 tablet 1    [DISCONTINUED] amLODIPine (NORVASC) 5 MG tablet Take 1 tablet (5 mg total) by mouth daily 1 tablet in the morning 90 tablet 1       Allergies:  No Known Allergies    Past Medical History:  Past Medical History:   Diagnosis Date    Hypertension        Past Surgical  History:  Past Surgical History:   Procedure Laterality Date    no past surgical history         Family History:  Family History   Family history unknown: Yes       Social History:  Social History     Socioeconomic History    Marital status: Widowed     Spouse name: Not on file    Number of children: Not on file    Years of education: Not on file    Highest education level: Not on file   Occupational History    Not on file   Tobacco Use    Smoking status: Former Smoker     Years: 4.00     Types: Cigarettes    Smokeless tobacco: Never Used    Tobacco comment: 1 pack a day   Substance and Sexual Activity    Alcohol use: Never    Drug use: Never    Sexual activity: Not Currently   Other Topics Concern    Not on file   Social History Narrative    Not on file     Social Determinants of Health     Financial Resource Strain:     Difficulty of Paying Living Expenses:    Food Insecurity:  Worried About Programme researcher, broadcasting/film/video in the Last Year:     Barista in the Last Year:    Transportation Needs:     Freight forwarder (Medical):     Lack of Transportation (Non-Medical):    Physical Activity:     Days of Exercise per Week:     Minutes of Exercise per Session:    Stress:     Feeling of Stress :    Social Connections:     Frequency of Communication with Friends and Family:     Frequency of Social Gatherings with Friends and Family:     Attends Religious Services:     Active Member of Clubs or Organizations:     Attends Banker Meetings:     Marital Status:    Intimate Partner Violence:     Fear of Current or Ex-Partner:     Emotionally Abused:     Physically Abused:     Sexually Abused:        The following sections were reviewed this encounter by the provider:   Tobacco   Allergies   Meds   Problems   Med Hx   Surg Hx   Fam Hx          ROS:  General/Constitutional:   Denies Chills. Denies Fatigue. Denies Fever.   Ophthalmologic:   Denies Blurred vision. Denies Eye Pain.    ENT:   Denies Nasal Discharge. Denies Ear pain. Denies Sinus pain.   Endocrine:   Denies Polydipsia. Denies Polyuria.   Respiratory:   Denies Cough. Denies Orthopnea. Denies Shortness of breath. Denies Wheezing.   Cardiovascular:   Denies Chest pain. Denies Chest pain with exertion. Denies Leg Claudication. Denies Palpitations. Denies Swelling in hands/feet.   Gastrointestinal:    As above denies Blood in stool. Denies Constipation. Denies Diarrhea. .   Genitourinary:   Denies Blood in urine. Denies Frequent urination. Denies Painful urination.   Musculoskeletal:   Denies Leg cramps. Denies Muscle aches.   Skin:   Denies Skin lesion(s).   Neurologic:   Denies Dizziness. Denies Gait abnormality. Denies Headache. Denies Tingling/Numbness.     Objective:   Vitals:  BP 135/71 (BP Site: Left arm, Patient Position: Sitting, Cuff Size: Medium)    Pulse 87    Temp 98.7 F (37.1 C) (Oral)    Resp 16    Ht 1.727 m (5\' 8" )    Wt 77.2 kg (170 lb 3.2 oz)    LMP  (LMP Unknown)    SpO2 98%    BMI 25.88 kg/m       Physical Exam:  General Examination:   Physical Exam   GENERAL APPEARANCE: alert, in no acute distress, well developed, well nourished  oriented to time, place, and person.   ORAL CAVITY: normal oropharynx, normal lips, mucosa moist.   NECK/THYROID: neck supple, no neck mass palpated, no thyromegaly.  LYMPH: no cervical/supraclavicular adenopathy   HEART: S1, S2 normal, no murmurs, rubs, gallops, regular rate and rhythm.   LUNGS: normal effort / no distress, normal breath sounds, clear to auscultation  bilaterally, no wheezes, rales, rhonchi.   ABDOMEN:soft, +BS, NT/ND, no masses palpable  EXTREMITIES: No LE edema bilaterally.   PERIPHERAL PULSES: 2+ dorsalis pedis, 2+ posterior tibial bilaterally.   NEURO:CN2-12 grossly intact, nonfocal  PSYCH: alert and oriented to time,place and person.   SKIN: moist, no focal rash    Assessment:       1. Gastroesophageal reflux  disease without  esophagitis  -Uncontrolled  -Discontinue amlodipine  -Lifestyle changes as discussed, recommend Pepcid  -Follow-up as needed if any worsening  2. Essential hypertension  - lisinopril-hydroCHLOROthiazide (Zestoretic) 20-12.5 MG per tablet; Take 1 tablet by mouth daily  Dispense: 30 tablet; Refill: 11  -Ambulatory blood pressure monitoring, call back/send via MyChart for medication titration as appropriate      Plan:   As above  Return in about 3 months (around 08/12/2019) for HTN.        Kela Millin, MD

## 2019-05-15 ENCOUNTER — Other Ambulatory Visit (INDEPENDENT_AMBULATORY_CARE_PROVIDER_SITE_OTHER): Payer: Self-pay | Admitting: Internal Medicine

## 2019-05-16 ENCOUNTER — Encounter (INDEPENDENT_AMBULATORY_CARE_PROVIDER_SITE_OTHER): Payer: Self-pay | Admitting: Internal Medicine

## 2019-05-17 ENCOUNTER — Encounter (INDEPENDENT_AMBULATORY_CARE_PROVIDER_SITE_OTHER): Payer: Self-pay | Admitting: Internal Medicine

## 2019-05-20 ENCOUNTER — Other Ambulatory Visit (INDEPENDENT_AMBULATORY_CARE_PROVIDER_SITE_OTHER): Payer: Self-pay | Admitting: Internal Medicine

## 2019-05-20 DIAGNOSIS — K219 Gastro-esophageal reflux disease without esophagitis: Secondary | ICD-10-CM

## 2019-05-22 ENCOUNTER — Other Ambulatory Visit (INDEPENDENT_AMBULATORY_CARE_PROVIDER_SITE_OTHER): Payer: Self-pay | Admitting: Internal Medicine

## 2019-05-22 DIAGNOSIS — K921 Melena: Secondary | ICD-10-CM

## 2019-05-22 NOTE — Progress Notes (Signed)
Please inform pt I have ordered the FIT test for stool blood, she may picck up from the office.  Also remind to call and schedule GI appt as per referral recently sent to her via mychart message. She is to go to ER if dizziness, or dark stools persist/worsen

## 2019-05-28 ENCOUNTER — Encounter (INDEPENDENT_AMBULATORY_CARE_PROVIDER_SITE_OTHER): Payer: Self-pay | Admitting: Internal Medicine

## 2019-05-29 ENCOUNTER — Encounter (INDEPENDENT_AMBULATORY_CARE_PROVIDER_SITE_OTHER): Payer: Self-pay

## 2019-06-06 ENCOUNTER — Encounter (INDEPENDENT_AMBULATORY_CARE_PROVIDER_SITE_OTHER): Payer: Self-pay | Admitting: Gastroenterology

## 2019-06-06 ENCOUNTER — Ambulatory Visit (INDEPENDENT_AMBULATORY_CARE_PROVIDER_SITE_OTHER): Payer: Medicare (Managed Care) | Admitting: Gastroenterology

## 2019-06-06 VITALS — BP 145/78 | HR 83 | Ht 66.0 in | Wt 170.2 lb

## 2019-06-06 DIAGNOSIS — K219 Gastro-esophageal reflux disease without esophagitis: Secondary | ICD-10-CM

## 2019-06-06 MED ORDER — FAMOTIDINE 40 MG PO TABS
40.0000 mg | ORAL_TABLET | Freq: Two times a day (BID) | ORAL | 2 refills | Status: DC
Start: 2019-06-06 — End: 2019-08-28

## 2019-06-06 NOTE — Progress Notes (Signed)
Hassie Bruce, M.D.  [facility]  [address]  [place], [place].  [address]  Phone:[phone]  Fax:[phone]    GERD     Date Time: 06/06/2019 8:54 AM  Patient Name: Cheryl Dalton,Cheryl Dalton  Referring Physician: Kela Millin, MD       Reason for Consultation:   Acid reflux  History mostly from son as his mother has dementia    History:   Cheryl Dalton is a 86 y.o. female who presents to the office for further evaluation and management of her GI complaints .    Duration: several months.    Current Symptoms: pyrosis, bloating, non specific abdominal discomfort, and acid feeling.    Symptoms Specifically Denied: Dysphagia, Weight Loss, Odynophagia, Coffee Ground and Emesis    Diagnostic Work up: none.    Diagnosis: GERD.    Exacerbating Factors: nothing.    Relieving Factors: antacids prn with some relief.    Additional Notes: The patient's bowel movements have been dark at time.    Past Medical History:     Past Medical History:   Diagnosis Date    Dementia     Gastroesophageal reflux disease     Hypertension        Past Surgical History:     Past Surgical History:   Procedure Laterality Date    LUMPECTOMY  2020    no past surgical history         Family History:     Family History   Family history unknown: Yes       Social History:     Social History     Socioeconomic History    Marital status: Widowed     Spouse name: Not on file    Number of children: Not on file    Years of education: Not on file    Highest education level: Not on file   Occupational History    Not on file   Tobacco Use    Smoking status: Former Smoker     Years: 4.00     Types: Cigarettes    Smokeless tobacco: Never Used    Tobacco comment: 1 pack a day   Substance and Sexual Activity    Alcohol use: Never    Drug use: Never    Sexual activity: Not Currently   Other Topics Concern    Not on file   Social History Narrative    Not on file     Social Determinants of Health     Financial Resource  Strain:     Difficulty of Paying Living Expenses:    Food Insecurity:     Worried About Programme researcher, broadcasting/film/video in the Last Year:     Barista in the Last Year:    Transportation Needs:     Freight forwarder (Medical):     Lack of Transportation (Non-Medical):    Physical Activity:     Days of Exercise per Week:     Minutes of Exercise per Session:    Stress:     Feeling of Stress :    Social Connections:     Frequency of Communication with Friends and Family:     Frequency of Social Gatherings with Friends and Family:     Attends Religious Services:     Active Member of Clubs or Organizations:     Attends Banker Meetings:     Marital Status:    Intimate Partner Violence:  Fear of Current or Ex-Partner:     Emotionally Abused:     Physically Abused:     Sexually Abused:         Allergies:   No Known Allergies    Medications:     Current Outpatient Medications:     amLODIPine (NORVASC) 2.5 MG tablet, Take 2.5 mg by mouth daily, Disp: , Rfl:     amLODIPine (NORVASC) 5 MG tablet, Take 5 mg by mouth daily, Disp: , Rfl:     cyanocobalamin 1000 MCG tablet, Take 1,000 mcg by mouth daily SL, Disp: , Rfl:     venlafaxine (EFFEXOR) 100 MG tablet, Take 1 tablet (100 mg total) by mouth daily 1 tablet daily, Disp: 90 tablet, Rfl: 1    vitamin D (CHOLECALCIFEROL) 25 MCG (1000 UT) tablet, Take 1,000 Units by mouth daily TAKE TABS DAILY, Disp: , Rfl:     lisinopril-hydroCHLOROthiazide (Zestoretic) 20-12.5 MG per tablet, Take 1 tablet by mouth daily, Disp: 30 tablet, Rfl: 11    Review of Systems:   Constitutional:No fever,chills,weight loss or gain.  Integument:No skin rashes or lesions.  Eyes:No changes in vision  ENMT:No changes in hearing,nasal discharge,sore throat,oral lesions.  Respiratory:No shortness of breath or cough.  Cardiovascular:No chest pain or palpitations.  Genitourinary:No nocturia,urinary frequency, or dysuria.  Gastrointestinal:See HPI.  Musculoskeletal:No back or  joint pain.  Neurologic:No migraine headaches,numbness, or tingling.  Endocrine:No polydipsia,cold or heat intolerance.  Hematologic:No bleeding or bruising.  Psychiatric:No depression or anxiety.     Physical Exam:     Vitals:    06/06/19 0837   BP: 145/78   BP Site: Left arm   Pulse: 83   SpO2: 98%   Weight: 77.2 kg (170 lb 3.2 oz)   Height: 1.676 m (5\' 6" )        General appearance - Well developed and well nourished. Normal gait.  HEENT- Normocephalic. Eomi. Sclera anicteric. Normal hearing. Nares normal.  Oropharynx normal.  Neck - Supple.  No thyromegaly.  No cervical lymphadenopathy.  Chest - clear to percussion and auscultation.  No wheeze, rhonchi, or rales.  Heart - regular rate and rhythm without murmurs, gallops, or rubs.  Abdomen - normal bowel sounds.  No focal tenderness to palpation. No hepatosplenomegaly.  No masses. No ascites.  Rectal - deferred.  Musculoskeletal - normal range of motion of arms and legs.  Extremities - no clubbing, cyanosis, or edema.  Skin - no rashes or lesions.  Neurologic - Alert and oriented to person, place and time.  No focal motor or sensory deficits.  No asterixis.  Recent and remote memory impaired.      Assessment:   GERD    Plan:   In view of age and dementia, will try conservative therapy with acid suppression.  Trial of pepcid 40 mg BID for thirty days.  Depending on response, consider PPI.  Old records from PCP and labs reviewed.  Discussed in detail with patient and son (neurologist).    Signed by: Estanislado Emms, MD    All recent labs and radiologic studies were reviewed at the time of the visit. Thank you for allowing me to participate in the care of your patient.

## 2019-08-05 ENCOUNTER — Ambulatory Visit (INDEPENDENT_AMBULATORY_CARE_PROVIDER_SITE_OTHER): Payer: Commercial Managed Care - HMO | Admitting: Internal Medicine

## 2019-08-08 ENCOUNTER — Other Ambulatory Visit (INDEPENDENT_AMBULATORY_CARE_PROVIDER_SITE_OTHER): Payer: Self-pay | Admitting: Internal Medicine

## 2019-08-13 ENCOUNTER — Telehealth (INDEPENDENT_AMBULATORY_CARE_PROVIDER_SITE_OTHER): Payer: Self-pay

## 2019-08-13 NOTE — Telephone Encounter (Signed)
Lvm for son to call back and provide f/u prior to refill for famotidine as it was 30 day trial.

## 2019-08-21 NOTE — Unmapped (Signed)
Pt daughter is calling to get a sooner appt for pt with Dr.Ellis. Pt daughter state pt original appt with Dr.Ellis was on 09/10/2019 but pt daughter got a call Dr. Rennis Harding is out that week. Pt daughter is concern for her mother and making sure she receive her medication, but she need to have an appt with Dr.Ellis. May we use a same day?

## 2019-08-22 NOTE — Telephone Encounter (Signed)
Patient is coming at 34 am.  She  Mary Frost thank you so much

## 2019-08-22 NOTE — Unmapped (Signed)
Lm for pt to call

## 2019-08-22 NOTE — Unmapped (Signed)
I am just out of the office that afternoon.  You can move her to 11:00 that day.

## 2019-08-28 ENCOUNTER — Telehealth (INDEPENDENT_AMBULATORY_CARE_PROVIDER_SITE_OTHER): Payer: Self-pay

## 2019-08-28 ENCOUNTER — Telehealth (INDEPENDENT_AMBULATORY_CARE_PROVIDER_SITE_OTHER): Payer: Self-pay | Admitting: Gastroenterology

## 2019-08-28 MED ORDER — FAMOTIDINE 40 MG PO TABS
40.0000 mg | ORAL_TABLET | Freq: Two times a day (BID) | ORAL | 0 refills | Status: AC
Start: 2019-08-28 — End: ?

## 2019-08-28 NOTE — Telephone Encounter (Signed)
Vm from daughter: Pt has moved to [place], [place]. Please send temporary ref[facility] to [facility]. Pt needs temporary refill of famotidine 40 mg until they see physician in [place] in August. They said it is working and would like whatever Dr. Theora Master can send until pt sees new GI in [place].

## 2019-08-28 NOTE — Telephone Encounter (Signed)
One month refill given per request

## 2019-09-01 NOTE — Telephone Encounter (Signed)
Called pt to get them rescheduled since the appointment that was scheduled you will not be in office and your next opening isn't until 10/15/2019 for a NPV but her daughter feels as if she needs to be seen sooner and her waiting another month is dangerous since she is 86 years old. Please advise

## 2019-09-01 NOTE — Telephone Encounter (Signed)
See other phone note.  I all ready approved them to come in at 11:00 on 8/11

## 2019-09-01 NOTE — Unmapped (Signed)
Called pt left VM

## 2019-09-01 NOTE — Unmapped (Signed)
Patients daughter called back, rescheduled for 11:00 on 8/11 per Dr. Rennis Harding. She was confused why this wasn't done earlier when she spoke to someone about it a few weeks ago. See other note.    Lynda didn't change her appointment from 1:00 to 11:00 on 8/11 when she spoke to the patients daughter. Apologized to her about this confusion.

## 2019-09-10 ENCOUNTER — Encounter

## 2019-09-10 ENCOUNTER — Ambulatory Visit: Admit: 2019-09-10 | Discharge: 2019-09-16 | Payer: PRIVATE HEALTH INSURANCE

## 2019-09-10 ENCOUNTER — Other Ambulatory Visit: Admit: 2019-09-10 | Payer: Medicare (Managed Care)

## 2019-09-10 ENCOUNTER — Ambulatory Visit: Payer: Medicare (Managed Care)

## 2019-09-10 DIAGNOSIS — I10 Essential (primary) hypertension: Secondary | ICD-10-CM

## 2019-09-10 LAB — LIPID PANEL
Cholesterol, Total: 197 mg/dL (ref 0–200)
HDL: 77 mg/dL (ref 60–92)
LDL Cholesterol: 102 mg/dL
Triglycerides: 88 mg/dL (ref 10–149)

## 2019-09-10 LAB — COMPREHENSIVE METABOLIC PANEL, SERUM
ALT: 4 U/L — ABNORMAL LOW (ref 7–52)
AST (SGOT): 14 U/L (ref 13–39)
Albumin: 4 g/dL (ref 3.5–5.7)
Alkaline Phosphatase: 70 U/L (ref 36–125)
Anion Gap: 8 mmol/L (ref 3–16)
BUN: 25 mg/dL (ref 7–25)
CO2: 29 mmol/L (ref 21–33)
Calcium: 8.8 mg/dL (ref 8.6–10.3)
Chloride: 102 mmol/L (ref 98–110)
Creatinine: 0.89 mg/dL (ref 0.60–1.30)
Glucose: 178 mg/dL — ABNORMAL HIGH (ref 70–100)
Osmolality, Calculated: 297 mosm/kg (ref 278–305)
Potassium: 4.4 mmol/L (ref 3.5–5.3)
Sodium: 139 mmol/L (ref 133–146)
Total Bilirubin: 0.5 mg/dL (ref 0.0–1.5)
Total Protein: 6.6 g/dL (ref 6.4–8.9)
eGFR AA CKD-EPI: 67 See note.
eGFR NONAA CKD-EPI: 57 See note.

## 2019-09-10 LAB — CBC
Hematocrit: 36.8 % (ref 35.0–45.0)
Hemoglobin: 12.4 g/dL (ref 11.7–15.5)
MCH: 32 pg (ref 27.0–33.0)
MCHC: 33.8 g/dL (ref 32.0–36.0)
MCV: 94.7 fL (ref 80.0–100.0)
MPV: 8.6 fL (ref 7.5–11.5)
Platelets: 227 10*3/uL (ref 140–400)
RBC: 3.88 10*6/uL (ref 3.80–5.10)
RDW: 14.2 % (ref 11.0–15.0)
WBC: 4.3 10*3/uL (ref 3.8–10.8)

## 2019-09-10 LAB — TSH: TSH: 2.15 u[IU]/mL (ref 0.45–4.12)

## 2019-09-10 LAB — HEMOGLOBIN A1C: Hemoglobin A1C: 6.6 % (ref 4.0–5.6)

## 2019-09-10 MED ORDER — amLODIPine (NORVASC) 5 MG tablet
5 | ORAL_TABLET | Freq: Every day | ORAL | 5 refills | Status: AC
Start: 2019-09-10 — End: 2019-12-03

## 2019-09-10 MED ORDER — venlafaxine (EFFEXOR) 100 MG tablet
100 | ORAL_TABLET | Freq: Two times a day (BID) | ORAL | 5 refills | Status: AC
Start: 2019-09-10 — End: ?

## 2019-09-10 MED ORDER — famotidine (PEPCID) 40 MG tablet
40 | ORAL_TABLET | Freq: Two times a day (BID) | ORAL | 5 refills | Status: AC
Start: 2019-09-10 — End: 2019-12-03

## 2019-09-10 NOTE — Unmapped (Signed)
New patient visit  Assessment and Plan:  1. Essential hypertension, benign   Hypertension:running low and symptomatic.  Decrease amlodipine to 5mg  daily. check labs. Monitor BP in the ambulatory setting.  You should watch your diet and reduce salt (sodium).  You should get regular exercise/activity to optimize weight to aid in medical management of this problem.   amLODIPine (NORVASC) 5 MG tablet    Hemoglobin A1c    Lipid Profile    Comprehensive Metabolic Panel, Serum    CBC    TSH (Thyroid Stimulating Hormone)   2. Hyperglycemia   Elevated A1c or glucose level: check a1c.  Improving diet by decreasing carbohydrates as well as exercise and weight loss will help to improve this.  Hemoglobin A1c    Comprehensive Metabolic Panel, Serum   3. Gastroesophageal reflux disease without esophagitis   cotninue Pepcid famotidine (PEPCID) 40 MG tablet   4. Lightheaded   Check labs and decreasing BP med dose. Hemoglobin A1c    Lipid Profile    Comprehensive Metabolic Panel, Serum    CBC    TSH (Thyroid Stimulating Hormone)   5. Adjustment disorder with mixed anxiety and depressed mood   Stable on Effexor.  Will continue. venlafaxine (EFFEXOR) 100 MG tablet         Concerns:  Memory problems and now living in [place].    In good health.    Some lightheaded and dizzy with standing.      Patient Active Problem List   Diagnosis   ??? Adjustment disorder with mixed anxiety and depressed mood  Some depression with living alone.  Doing OK.   ??? Aortic atherosclerosis (CMS Dx)   ??? Essential hypertension, benign  Low today.   ??? Hx of nonmelanoma skin cancer   ??? Hyperglycemia   ??? Mild asthma   ??? Mild cognitive impairment  Early dementia.  Significant.  In independent living with support.  Daughter does finances.  No cooking.  No driving.  Needs help with medicine and has aids come in daily.     ??? Nephrolithiasis   ??? Personal history of malignant neoplasm of breast        Reviewed meds, allergies, past medical, surgical, family, and social  history.  See chart.    ROS: No fevers, chills, nausea, vomiting, diarrhea, constipation, chest pains, cough, congestion, shortness of breath, headaches or vision changes, bowel or bladder changes, bloody stools or melana.  No depression.  No rashes.  No joint problems.  No anemia or thyroid problems.    Physical exam:  General: Well appearing. No acute distress.  Skin: no rash or suspicious lesions  HEENT: PERRL, oral pharynx normal, TMs normal, no thyromegaly  Lymphatic: no LAD of anterior cervical and supraclavicular  Cardiac: regular rate and rhythm. No murmurs rubs or gallops.  Normal S1 and S2.  No palpable thrill  Respiratory: clear to auscultation bilateral.  No respiratory distress.  Normal palpation.  Abdominal: No hepatosplenomegaly, no tenderness, normal bowel sounds.  Vascular: no edema  Nuero: 5/5 strength in all extremities  Psych: alert and oriented.  Normal mood and affect

## 2019-09-23 ENCOUNTER — Other Ambulatory Visit (INDEPENDENT_AMBULATORY_CARE_PROVIDER_SITE_OTHER): Payer: Self-pay | Admitting: Gastroenterology

## 2019-10-17 NOTE — Telephone Encounter (Signed)
Lft vm to make an app per dr Rennis Harding

## 2019-10-17 NOTE — Telephone Encounter (Signed)
Pt daughter called she need certification per her insurance to be able to pay for her living expenses it states that she should be able to do a certain amount of daily needs but she states her mother can only do one daily living needs with her dementia she wants to know do she need a appt to access her condition or can you just do the certification paper please advise?

## 2019-10-17 NOTE — Telephone Encounter (Signed)
Please schedule an appointment.

## 2019-10-20 NOTE — Unmapped (Addendum)
Attached to another msg

## 2019-10-20 NOTE — Telephone Encounter (Signed)
Daughter called to schedule and she doesn't want to wait until 11/20/19 if she does not have to.  Anyplace we can put her?

## 2019-10-20 NOTE — Unmapped (Signed)
I don't have any none acute visits until then.

## 2019-10-20 NOTE — Unmapped (Signed)
Pt scheduled

## 2019-11-20 ENCOUNTER — Ambulatory Visit: Payer: Medicare (Managed Care)

## 2019-12-01 NOTE — Unmapped (Signed)
Patients daughter [name] called, states they need to get her medications, Famotodine and Amlodipine at a different pharmacy next month because she will be out of town for 1 month in [place]. They just filled these medications 10 days ago at [facility].    [name] will call us back with the pharmacy information to send these to.

## 2019-12-03 MED ORDER — famotidine (PEPCID) 40 MG tablet
40 | ORAL_TABLET | Freq: Two times a day (BID) | ORAL | 5 refills | Status: AC
Start: 2019-12-03 — End: ?

## 2019-12-03 MED ORDER — amLODIPine (NORVASC) 5 MG tablet
5 | ORAL_TABLET | Freq: Every day | ORAL | 5 refills | Status: AC
Start: 2019-12-03 — End: ?

## 2019-12-03 NOTE — Unmapped (Signed)
Patient will be in [place] for a month with her son.  Needs 2 refills  By end of next week.  Rx Refill Request    Information:  Patient's PCP: Larey Seat II   Last office visit with encounter provider: 09/10/2019 Theador Hawthorne, MD  Last office visit at this office: 09/10/2019 Theador Hawthorne, MD  Next scheduled appointment: 01/01/2020 Theador Hawthorne, MD    Follow-up Instruction (From Last 3 Office Visits)            09/10/2019 No Follow-up on file.    Theador Hawthorne, MD        Wellness Visit   Topic Date Due   ??? Annual Medicare Wellness Visit  Never done   ??? Physical visit  Never done       Action:  The patient already has a visit scheduled  Prescription last ordered: 09/10/19  Medication Pended yes  Pharmacy verified: Yes  MESSAGE FORWARDED TO PROVIDER    Other:  Requested Prescriptions     Pending Prescriptions Disp Refills   ??? amLODIPine (NORVASC) 5 MG tablet 30 tablet 5     Sig: Take 1 tablet (5 mg total) by mouth daily.   ??? famotidine (PEPCID) 40 MG tablet 60 tablet 5     Sig: Take 1 tablet (40 mg total) by mouth 2 times a day.     OARRS documentation: No flowsheet data found.  Recent Lab work (A1c, Cr, TSH, LDL) in case relevant to refill:  Lab Results   Test Name Value Date    Hemoglobin A1C 6.6 (H) 09/10/2019    Creatinine 0.89 09/10/2019    TSH 2.15 09/10/2019    LDL Cholesterol 102 09/10/2019

## 2019-12-10 ENCOUNTER — Emergency Department
Admission: EM | Admit: 2019-12-10 | Discharge: 2019-12-10 | Disposition: A | Discharge status: Home / Self Care | Payer: Commercial Managed Care - HMO | Attending: Emergency Medicine | Admitting: Emergency Medicine

## 2019-12-10 ENCOUNTER — Emergency Department: Payer: Commercial Managed Care - HMO

## 2019-12-10 DIAGNOSIS — K8050 Calculus of bile duct without cholangitis or cholecystitis without obstruction: Secondary | ICD-10-CM | POA: Insufficient documentation

## 2019-12-10 DIAGNOSIS — R112 Nausea with vomiting, unspecified: Secondary | ICD-10-CM | POA: Insufficient documentation

## 2019-12-10 DIAGNOSIS — Z20822 Contact with and (suspected) exposure to covid-19: Secondary | ICD-10-CM | POA: Insufficient documentation

## 2019-12-10 DIAGNOSIS — R079 Chest pain, unspecified: Secondary | ICD-10-CM

## 2019-12-10 LAB — CBC AND DIFFERENTIAL
Absolute NRBC: 0 10*3/uL (ref 0.00–0.00)
Basophils Absolute Automated: 0.01 10*3/uL (ref 0.00–0.08)
Basophils Automated: 0.1 %
Eosinophils Absolute Automated: 0.01 10*3/uL (ref 0.00–0.44)
Eosinophils Automated: 0.1 %
Hematocrit: 38.3 % (ref 34.7–43.7)
Hgb: 12.4 g/dL (ref 11.4–14.8)
Immature Granulocytes Absolute: 0.03 10*3/uL (ref 0.00–0.07)
Immature Granulocytes: 0.4 %
Lymphocytes Absolute Automated: 0.6 10*3/uL (ref 0.42–3.22)
Lymphocytes Automated: 7.1 %
MCH: 30.7 pg (ref 25.1–33.5)
MCHC: 32.4 g/dL (ref 31.5–35.8)
MCV: 94.8 fL (ref 78.0–96.0)
MPV: 10.5 fL (ref 8.9–12.5)
Monocytes Absolute Automated: 0.4 10*3/uL (ref 0.21–0.85)
Monocytes: 4.7 %
Neutrophils Absolute: 7.43 10*3/uL — ABNORMAL HIGH (ref 1.10–6.33)
Neutrophils: 87.6 %
Nucleated RBC: 0 /100 WBC (ref 0.0–0.0)
Platelets: 218 10*3/uL (ref 142–346)
RBC: 4.04 10*6/uL (ref 3.90–5.10)
RDW: 13 % (ref 11–15)
WBC: 8.48 10*3/uL (ref 3.10–9.50)

## 2019-12-10 LAB — TSH: TSH: 2.06 u[IU]/mL (ref 0.35–4.94)

## 2019-12-10 LAB — COMPREHENSIVE METABOLIC PANEL
ALT: 9 U/L (ref 0–55)
AST (SGOT): 18 U/L (ref 5–34)
Albumin/Globulin Ratio: 1.3 (ref 0.9–2.2)
Albumin: 3.8 g/dL (ref 3.5–5.0)
Alkaline Phosphatase: 100 U/L (ref 37–106)
Anion Gap: 12 (ref 5.0–15.0)
BUN: 15 mg/dL (ref 7–19)
Bilirubin, Total: 0.4 mg/dL (ref 0.2–1.2)
CO2: 24 mEq/L (ref 22–29)
Calcium: 9.4 mg/dL (ref 7.9–10.2)
Chloride: 101 mEq/L (ref 100–111)
Creatinine: 0.9 mg/dL (ref 0.6–1.0)
Globulin: 3 g/dL (ref 2.0–3.6)
Glucose: 130 mg/dL — ABNORMAL HIGH (ref 70–100)
Potassium: 4.1 mEq/L (ref 3.5–5.1)
Protein, Total: 6.8 g/dL (ref 6.0–8.3)
Sodium: 137 mEq/L (ref 136–145)

## 2019-12-10 LAB — URINALYSIS REFLEX TO MICROSCOPIC EXAM - REFLEX TO CULTURE
Bilirubin, UA: NEGATIVE
Blood, UA: NEGATIVE
Glucose, UA: NEGATIVE
Ketones UA: NEGATIVE
Nitrite, UA: NEGATIVE
Protein, UR: 100 — AB
Specific Gravity UA: 1.019 (ref 1.001–1.035)
Urine pH: 6 (ref 5.0–8.0)
Urobilinogen, UA: NEGATIVE mg/dL (ref 0.2–2.0)

## 2019-12-10 LAB — COVID-19 (SARS-COV-2) & INFLUENZA  A/B, NAA (ROCHE LIAT)
Influenza A: NOT DETECTED
Influenza B: NOT DETECTED
SARS CoV 2 Overall Result: NOT DETECTED

## 2019-12-10 LAB — TROPONIN I: Troponin I: <0.01 ng/mL (ref 0.00–0.05)

## 2019-12-10 LAB — LIPASE: Lipase: 26 U/L (ref 8–78)

## 2019-12-10 LAB — GFR: EGFR: 58.9

## 2019-12-10 MED ORDER — ACETAMINOPHEN 325 MG PO TABS
650.0000 mg | ORAL_TABLET | Freq: Once | ORAL | Status: AC
Start: 2019-12-10 — End: 2019-12-10
  Administered 2019-12-10: 09:00:00 650 mg via ORAL
  Filled 2019-12-10: qty 2

## 2019-12-10 MED ORDER — ACETAMINOPHEN 325 MG PO TABS
650.0000 mg | ORAL_TABLET | ORAL | 0 refills | Status: AC | PRN
Start: 2019-12-10 — End: 2019-12-20

## 2019-12-10 MED ORDER — SODIUM CHLORIDE 0.9 % IV SOLN
INTRAVENOUS | Status: DC
Start: 2019-12-10 — End: 2019-12-10

## 2019-12-10 MED ORDER — LIDOCAINE VISCOUS HCL 2 % MT SOLN
10.0000 mL | Freq: Once | OROMUCOSAL | Status: AC
Start: 2019-12-10 — End: 2019-12-10
  Administered 2019-12-10: 07:00:00 10 mL via OROMUCOSAL
  Filled 2019-12-10: qty 15

## 2019-12-10 MED ORDER — IOHEXOL 350 MG/ML IV SOLN
100.0000 mL | Freq: Once | INTRAVENOUS | Status: AC | PRN
Start: 2019-12-10 — End: 2019-12-10
  Administered 2019-12-10: 08:00:00 100 mL via INTRAVENOUS

## 2019-12-10 MED ORDER — ONDANSETRON HCL 4 MG/2ML IJ SOLN
4.0000 mg | Freq: Once | INTRAMUSCULAR | Status: AC
Start: 2019-12-10 — End: 2019-12-10
  Administered 2019-12-10: 07:00:00 4 mg via INTRAVENOUS
  Filled 2019-12-10: qty 2

## 2019-12-10 MED ORDER — ALUM & MAG HYDROXIDE-SIMETH 200-200-20 MG/5ML PO SUSP
30.0000 mL | Freq: Once | ORAL | Status: AC
Start: 2019-12-10 — End: 2019-12-10
  Administered 2019-12-10: 07:00:00 30 mL via ORAL
  Filled 2019-12-10: qty 30

## 2019-12-10 NOTE — ED Provider Notes (Signed)
History     Chief Complaint   Patient presents with    Abdominal Pain     The history is provided by the patient and medical records. No language interpreter was used.   Abdominal Pain  Pain location:  Epigastric  Pain quality comment:  Constant  Pain radiates to:  Does not radiate  Pain severity:  Severe  Onset quality:  Sudden  Duration:  6 hours  Timing:  Constant  Progression:  Unchanged  Chronicity:  Recurrent  Context: not recent illness    Relieved by:  Nothing  Worsened by:  Nothing  Ineffective treatments:  None tried  Associated symptoms: nausea and vomiting    Associated symptoms: no chest pain, no chills, no cough, no diarrhea, no dysuria, no fever, no shortness of breath and no sore throat    Risk factors: being elderly    Risk factors: no recent hospitalization       Takes pepcid for reflux daily  Hx via son primarily who is pediatric neurologist  Past Medical History:   Diagnosis Date    Dementia     Gastroesophageal reflux disease     Hypertension        Past Surgical History:   Procedure Laterality Date    LUMPECTOMY  2020    no past surgical history         Family History   Family history unknown: Yes       Social  Social History     Tobacco Use    Smoking status: Former Smoker     Years: 4.00     Types: Cigarettes    Smokeless tobacco: Never Used    Tobacco comment: 1 pack a day   Substance Use Topics    Alcohol use: Never    Drug use: Never       .     No Known Allergies    Home Medications     Med List Status: In Progress Set By: Larey Days, RN at 12/10/2019  6:23 AM                amLODIPine (NORVASC) 2.5 MG tablet     Take 2.5 mg by mouth daily     amLODIPine (NORVASC) 5 MG tablet     Take 5 mg by mouth daily     cyanocobalamin 1000 MCG tablet     Take 1,000 mcg by mouth daily SL     famotidine (PEPCID) 40 MG tablet     Take 1 tablet (40 mg total) by mouth 2 (two) times daily     lisinopril-hydroCHLOROthiazide (Zestoretic) 20-12.5 MG per tablet     Take 1 tablet by mouth  daily     venlafaxine (EFFEXOR) 100 MG tablet     TAKE 1 TABLET (100 MG TOTAL) BY MOUTH DAILY 1 TABLET DAILY     vitamin D (CHOLECALCIFEROL) 25 MCG (1000 UT) tablet     Take 1,000 Units by mouth daily TAKE TABS DAILY           Review of Systems   Constitutional: Negative for chills and fever.   HENT: Negative for congestion and sore throat.    Respiratory: Negative for cough and shortness of breath.    Cardiovascular: Negative for chest pain.   Gastrointestinal: Positive for abdominal pain, nausea and vomiting. Negative for diarrhea.   Genitourinary: Negative for dysuria.   All other systems reviewed and are negative.      Physical Exam  BP: 182/74, Heart Rate: 66, Temp: 97.6 F (36.4 C), Resp Rate: 16, SpO2: 95 %, Weight: 80 kg    Physical Exam  Nursing note and vitals reviewed.  Constitutional:  Well developed, well nourished. Awake & Oriented x3.  Head:  Atraumatic. Normocephalic.    Eyes:  PERRL. EOMI. Conjunctivae are not pale.  ENT:  Mucous membranes are moist and intact. Oropharynx is clear and symmetric.  Patent airway.  Neck:  Supple. Full ROM.    Cardiovascular:  Regular rate. Regular rhythm. No murmurs, rubs, or gallops.  Pulmonary/Chest:  No evidence of respiratory distress. Clear to auscultation bilaterally.  No wheezing, rales or rhonchi.   Abdominal:  Soft and non-distended. There is no tenderness. No rebound, guarding, or rigidity.  Back:  Full ROM. Nontender.  Extremities:  No edema. No cyanosis. No clubbing. Full range of motion in all extremities.  Skin:  Skin is warm and dry.  No diaphoresis. No rash.   Neurological:  Alert, awake, and appropriate. Normal speech. Motor normal.  Psychiatric:  Good eye contact. Normal interaction, affect, and behavior.    Pox 95 RA normal no tx needed    MDM and ED Course     ED Medication Orders (From admission, onward)    Start Ordered     Status Ordering Provider    12/10/19 0908 12/10/19 0907  acetaminophen (TYLENOL) tablet 650 mg  Once        Route: Oral   Ordered Dose: 650 mg     Last MAR action: Given Lanecia Sliva C III    12/10/19 0822 12/10/19 0822  iohexol (OMNIPAQUE) 350 MG/ML injection 100 mL  IMG once as needed        Route: Intravenous  Ordered Dose: 100 mL     Last MAR action: Imaging Agent Given Romelle Muldoon C III    12/10/19 1610 12/10/19 0632    Continuous        Route: Intravenous     Discontinued Violetta Lavalle C III    12/10/19 9604 12/10/19 0632  ondansetron (ZOFRAN) injection 4 mg  Once        Route: Intravenous  Ordered Dose: 4 mg     Last MAR action: Given Jeanae Whitmill C III    12/10/19 5409 12/10/19 0632  lidocaine viscous (XYLOCAINE) 2 % solution 10 mL  Once        Route: Mouth/Throat  Ordered Dose: 10 mL     Last MAR action: Given Nashay Brickley C III    12/10/19 8119 12/10/19 0632  alum & mag hydroxide-simethicone (MAALOX PLUS) 200-200-20 mg/5 mL suspension 30 mL  Once        Route: Oral  Ordered Dose: 30 mL     Last MAR action: Given Adison Jerger C III         Results for orders placed or performed during the hospital encounter of 12/10/19   COVID-19 (SARS-CoV-2) and Influenza A/B, NAA ([facility])    Specimen: Nasopharyngeal; Culturette   Result Value Ref Range    Purpose of COVID testing Diagnostic -PUI     SARS-CoV-2 Specimen Source Nasopharyngeal     SARS CoV 2 Overall Result Not Detected     Influenza A Not Detected     Influenza B Not Detected     Narrative    o Collect and clearly label specimen type:  o PREFERRED-Upper respiratory specimen: One Nasopharyngeal  Swab in Transport Media.  o Hand deliver to laboratory ASAP  Diagnostic -PUI  CBC and differential   Result Value Ref Range    WBC 8.48 3.10 - 9.50 x10 3/uL    Hgb 12.4 11.4 - 14.8 g/dL    Hematocrit 16.1 09.6 - 43.7 %    Platelets 218 142 - 346 x10 3/uL    RBC 4.04 3.90 - 5.10 x10 6/uL    MCV 94.8 78.0 - 96.0 fL    MCH 30.7 25.1 - 33.5 pg    MCHC 32.4 31.5 - 35.8 g/dL    RDW 13 11 - 15 %    MPV 10.5 8.9 - 12.5 fL    Neutrophils 87.6 None %    Lymphocytes Automated  7.1 None %    Monocytes 4.7 None %    Eosinophils Automated 0.1 None %    Basophils Automated 0.1 None %    Immature Granulocytes 0.4 None %    Nucleated RBC 0.0 0.0 - 0.0 /100 WBC    Neutrophils Absolute 7.43 (H) 1.10 - 6.33 x10 3/uL    Lymphocytes Absolute Automated 0.60 0.42 - 3.22 x10 3/uL    Monocytes Absolute Automated 0.40 0.21 - 0.85 x10 3/uL    Eosinophils Absolute Automated 0.01 0.00 - 0.44 x10 3/uL    Basophils Absolute Automated 0.01 0.00 - 0.08 x10 3/uL    Immature Granulocytes Absolute 0.03 0.00 - 0.07 x10 3/uL    Absolute NRBC 0.00 0.00 - 0.00 x10 3/uL   Comprehensive metabolic panel   Result Value Ref Range    Glucose 130 (H) 70 - 100 mg/dL    BUN 15 7 - 19 mg/dL    Creatinine 0.9 0.6 - 1.0 mg/dL    Sodium 045 409 - 811 mEq/L    Potassium 4.1 3.5 - 5.1 mEq/L    Chloride 101 100 - 111 mEq/L    CO2 24 22 - 29 mEq/L    Calcium 9.4 7.9 - 10.2 mg/dL    Protein, Total 6.8 6.0 - 8.3 g/dL    Albumin 3.8 3.5 - 5.0 g/dL    AST (SGOT) 18 5 - 34 U/L    ALT 9 0 - 55 U/L    Alkaline Phosphatase 100 37 - 106 U/L    Bilirubin, Total 0.4 0.2 - 1.2 mg/dL    Globulin 3.0 2.0 - 3.6 g/dL    Albumin/Globulin Ratio 1.3 0.9 - 2.2    Anion Gap 12.0 5.0 - 15.0   Lipase   Result Value Ref Range    Lipase 26 8 - 78 U/L   Troponin I   Result Value Ref Range    Troponin I <0.01 0.00 - 0.05 ng/mL   TSH   Result Value Ref Range    TSH 2.06 0.35 - 4.94 uIU/mL   UA Reflex to Micro - Reflex to Culture   Result Value Ref Range    Urine Type Urine, Clean Ca     Color, UA Yellow Colorless - Yellow    Clarity, UA Sl Cloudy (A) Clear - Hazy    Specific Gravity UA 1.019 1.001 - 1.035    Urine pH 6.0 5.0 - 8.0    Leukocyte Esterase, UA Trace (A) Negative    Nitrite, UA Negative Negative    Protein, UR 100 (A) Negative    Glucose, UA Negative Negative    Ketones UA Negative Negative    Urobilinogen, UA Negative 0.2 - 2.0 mg/dL    Bilirubin, UA Negative Negative    Blood, UA Negative Negative    RBC, UA 0 -  2 0 - 5 /hpf    WBC, UA 0 - 5 0 - 5  /hpf    Squamous Epithelial Cells, Urine 0 - 5 0 - 25 /hpf   GFR   Result Value Ref Range    EGFR 58.9      Results for orders placed or performed during the hospital encounter of 12/10/19   XR Chest  AP Portable    Narrative    XR CHEST AP PORTABLE    CLINICAL INDICATION:   abd pain    COMPARISON: None    FINDINGS: The cardiomediastinal silhouette appears within normal size  limits for portable technique and patient positioning. There is no  evidence for focal airspace consolidation, pleural effusion, or  pneumothorax. The pulmonary vascularity appears unremarkable.      Impression     No acute pulmonary or pleural disease.    Sandie Ano, MD   12/10/2019 8:22 AM   CT Abd/Pelvis with IV Contrast only    Narrative    History: Abdominal pain not otherwise specified    CT abdomen pelvis 100 cc Visipaque 320 IV. Note: Note that CT scanning  at this site  utilizes multiple dose reduction techniques including  automatic exposure control, adjustment of the MAS and/or KVP according  to patient's size and use of iterative reconstruction technique    Lung bases clear. Spleen normal. Pancreas normal.    Gallbladder distended with mildly thickened wall. Liver is slightly low  in attenuation suggesting fatty infiltration. No extrahepatic biliary  ductal dilatation.    Small right renal cyst requires no follow-up. Left kidney unremarkable.  No adenopathy or aneurysm. Scattered aortic calcific patient's.  Scattered colonic diverticula. Uterus and adnexa unremarkable.    Moderate degenerative changes of the spine and pelvis noted    Small amount of fat seen in the right inguinal canal    Mild to moderate hiatal hernia present. Appendix not well seen. No right  lower quadrant fluid collection noted      Impression    1. Distended thick-walled gallbladder should be further evaluated by  gallbladder sonogram in a patient with abdominal pain    Laurena Slimmer, MD   12/10/2019 8:32 AM   US Abdomen Limited RUQ    Narrative    Indication:  Right upper quadrant pain. Distended gallbladder on CT    FINDINGS: A right upper quadrant only ultrasound was requested.    The liver is normal in size measuring 13.6 cm. No focal mass.  Gallbladder is physiologically distended and contains multiple stones  which move and shadow. No wall thickening. The largest stone measures 7  mm. In the fundal portion of the gallbladder there is focal wall  thickening with nodularity. This could represent gallbladder polyps.  Common duct is normal and measures 5 mm.  Visualized pancreas is unremarkable. Atherosclerotic changes in the  aorta.      Impression     Multiple gallstones which are mobile. Focal thickening of  the fundal region of the gallbladder which could represent polyps or  abnormality of the gallbladder wall. No evidence of acute cholecystitis.  Normal common duct.    Kinnie Feil, MD   12/10/2019 10:00 AM         Ddx:  Appe, chole, gastritis, AAA, or ACS  MDM  ekg read by me:  NSR 68.  Consider lVH.  Normal axis and intervals.  No sign stt changes  Monitor read by me:  nsr 68  Chest xray read by  me to ro CHF or infiltrate NAD  Labs noted  IV fluid for hydration and IV meds for pain  CT abdomen/pelvis read by me to ro above pathology:  Distended GB  US shows gall stones  I had conversation with son who is agreeable to [facility] home to return at once if worse.  She is visiting from [place] until after thanksgiving and will return home then.            Procedures    Clinical Impression & Disposition     Clinical Impression  Final diagnoses:   Biliary colic        ED Disposition     ED Disposition Condition Date/Time Comment    Discharge  Wed Dec 10, 2019 10:52 AM Sabino Niemann discharge to home/self care.    Condition at disposition: Stable           Discharge Medication List as of 12/10/2019 10:53 AM      START taking these medications    Details   acetaminophen (TYLENOL) 325 MG tablet Take 2 tablets (650 mg total) by mouth every 4 (four) hours as needed for Pain,  Starting Wed 12/10/2019, Until Sat 12/20/2019 at 2359, Print                       Bradly Chris, MD  12/10/19 [phone]

## 2019-12-10 NOTE — Discharge Instructions (Signed)
Biliary Colic    You have been diagnosed with gallbladder pain. This is called biliary colic.    Biliary colic describes the crampy pain from a gallbladder with gallstones.    The gall bladder is a small sack that hangs from the liver. It stores liquid called bile. The liver makes bile. When you eat, the gall bladder squeezes bile through a duct (tube) into the intestines. This is to help you to digest fat.    Gallstones form from bile crystals. The stone may be smaller than a pea, or as large as a golf ball. There may be one or several stones. The stone may block the tube that drains bile from the gallbladder. This blockage causes gallbladder spasms. It also causes pain. The pain usually comes and goes and is crampy. It often starts after eating foods with a lot of fat.    There may be nausea and vomiting with the pain.    Biliary colic is usually treated with pain medicines. You may also be given medicine for the nausea and vomiting.    Avoid eating foods that are fried or have a lot of fat.    If you have more pain episodes, your gallbladder may need to be removed.   Contact your family doctor for a general surgeon referral.    YOU SHOULD SEEK MEDICAL ATTENTION IMMEDIATELY, EITHER HERE OR AT THE NEAREST EMERGENCY DEPARTMENT, IF ANY OF THE FOLLOWING OCCURS:   Pain gets worse or does not go away.   You keep vomiting or can't keep any fluids down.   You have a fever (temperature higher than 100.4F / 38C) or shaking chills.   Your skin or eyes get yellow or your urine is dark and brown.

## 2019-12-10 NOTE — ED Triage Notes (Signed)
to ED with complaints of abdominal pain beginning around midnight. Denies N/V/D, fevers, or dysuria.

## 2019-12-11 ENCOUNTER — Telehealth (INDEPENDENT_AMBULATORY_CARE_PROVIDER_SITE_OTHER): Payer: Self-pay | Admitting: Internal Medicine

## 2019-12-11 LAB — ECG 12-LEAD
Atrial Rate: 68 {beats}/min
P-R Interval: 142 ms
Q-T Interval: 448 ms
QRS Duration: 90 ms
QTC Calculation (Bezet): 476 ms
R Axis: -28 degrees
T Axis: 72 degrees
Ventricular Rate: 68 {beats}/min

## 2019-12-11 NOTE — Telephone Encounter (Signed)
Attempted to call patient for ED follow up visit. LMTCB.

## 2020-01-01 ENCOUNTER — Ambulatory Visit: Admit: 2020-01-01 | Discharge: 2020-01-01 | Payer: Medicare (Managed Care)

## 2020-01-01 DIAGNOSIS — I10 Essential (primary) hypertension: Secondary | ICD-10-CM

## 2020-01-01 MED ORDER — mirtazapine (REMERON) 15 MG tablet
15 | ORAL_TABLET | Freq: Every evening | ORAL | 0 refills | Status: AC
Start: 2020-01-01 — End: ?

## 2020-01-01 NOTE — Unmapped (Signed)
Assessment and Plan:  1. Essential hypertension, benign   Well controlled. Continue med    2. Late onset Alzheimer's dementia without behavioral disturbance (CMS Dx)   Considering assisted living or alternative living arrangement.  Will fill out long term care insurance forms when needed. mirtazapine (REMERON) 15 MG tablet   3. Anxiety   Worse recently.  Will start Remeron.     mirtazapine (REMERON) 15 MG tablet     4.  Right low back pain for 4 days.  Discussed options.  Continue Tylenol for now.  Consider XR if nto improving.  5.  Gall stones: symptoms improved with low fat diet.  Will continue for now.  If symptoms worse or more often, consider surgical referral.    Present History:  History with Daughter.    Patient Active Problem List   Diagnosis   ??? Adjustment disorder with mixed anxiety and depressed mood   ??? Aortic atherosclerosis (CMS Dx)   ??? Essential hypertension, benign     ??? Hx of nonmelanoma skin cancer   ??? Hyperglycemia   ??? Late onset Alzheimer's dementia without behavioral disturbance (CMS Dx)     ??? Nephrolithiasis   ??? Personal history of malignant neoplasm of breast      Right back and hip pains.  4 days.  Worse with plane ride back from [place].  Tylenol helps.    Has long term care policy.    Needs assistance with showering.  Needs help with food prep.  Daughter does finances.  Daughter dose pill box.  Has a box with an alarm.  Able to use the restroom on own.  Aides twice a day and daughter comes once a day.    Pain in upper abd.  Went to the ED in [place] and had gall stones.  Discussed surg vs low fat diet.    Has a lot of anxiety at times.      Current Outpatient Medications   Medication Sig   ??? amLODIPine Take 1 tablet (5 mg total) by mouth daily.   ??? famotidine Take 1 tablet (40 mg total) by mouth 2 times a day.   ??? venlafaxine Take 1 tablet (100 mg total) by mouth 2 times a day.     No current facility-administered medications for this visit.        Allergies   Allergen Reactions   ??? Amoxicillin       GI upset     ??? Aspirin      Had ulcer/ told not to take     ??? Erythromycin    ??? Erythromycin Lactobionate    ??? Fluconazole Rash   ??? Nitrofuran Derivative Rash       Physical exam:  Wt Readings from Last 3 Encounters:   09/10/19 170 lb (77.1 kg)     Temp Readings from Last 3 Encounters:   No data found for Temp     BP Readings from Last 3 Encounters:   09/10/19 108/60     Pulse Readings from Last 3 Encounters:   09/10/19 75     General: Well appearing. No acute distress.  Skin: no rash or suspicious lesions  Cardiac: regular rate and rhythm. No murmurs rubs or gallops.  Normal S1 and S2.  No palpable thrill  Respiratory: clear to auscultation bilateral.  No respiratory distress.  Normal palpation.  Vascular: no edema  Psych: alert and oriented.  Normal mood and affect

## 2021-09-21 ENCOUNTER — Emergency Department: Admit: 2021-09-21 | Payer: MEDICARE

## 2021-09-21 ENCOUNTER — Emergency Department: Payer: MEDICARE

## 2021-09-21 ENCOUNTER — Inpatient Hospital Stay
Admission: EM | Admit: 2021-09-21 | Discharge: 2021-09-28 | Disposition: A | Discharge status: Skilled Nursing Facility | Payer: MEDICARE | Admitting: Internal Medicine

## 2021-09-21 DIAGNOSIS — S72012A Unspecified intracapsular fracture of left femur, initial encounter for closed fracture: Secondary | ICD-10-CM

## 2021-09-21 DIAGNOSIS — S72002A Fracture of unspecified part of neck of left femur, initial encounter for closed fracture: Secondary | ICD-10-CM

## 2021-09-21 LAB — CBC WITH AUTO DIFFERENTIAL
Basophils %: 0.8 %
Basophils Absolute: 0.1 10*3/uL (ref 0.0–0.2)
Eosinophils %: 2.4 %
Eosinophils Absolute: 0.2 10*3/uL (ref 0.0–0.6)
Hematocrit: 36.3 % (ref 36.0–48.0)
Hemoglobin: 12.2 g/dL (ref 12.0–16.0)
Lymphocytes %: 18.9 %
Lymphocytes Absolute: 1.6 10*3/uL (ref 1.0–5.1)
MCH: 31.4 pg (ref 26.0–34.0)
MCHC: 33.7 g/dL (ref 31.0–36.0)
MCV: 93.2 fL (ref 80.0–100.0)
MPV: 9 fL (ref 5.0–10.5)
Monocytes %: 12.7 %
Monocytes Absolute: 1.1 10*3/uL (ref 0.0–1.3)
Neutrophils %: 65.2 %
Neutrophils Absolute: 5.4 10*3/uL (ref 1.7–7.7)
Platelets: 270 10*3/uL (ref 135–450)
RBC: 3.9 M/uL — ABNORMAL LOW (ref 4.00–5.20)
RDW: 14.1 % (ref 12.4–15.4)
WBC: 8.3 10*3/uL (ref 4.0–11.0)

## 2021-09-21 LAB — BASIC METABOLIC PANEL W/ REFLEX TO MG FOR LOW K
Anion Gap: 9 (ref 3–16)
BUN: 14 mg/dL (ref 7–20)
CO2: 29 mmol/L (ref 21–32)
Calcium: 8.6 mg/dL (ref 8.3–10.6)
Chloride: 100 mmol/L (ref 99–110)
Creatinine: 1 mg/dL (ref 0.6–1.2)
Est, Glom Filt Rate: 53 — AB (ref >60)
Glucose: 126 mg/dL — ABNORMAL HIGH (ref 70–99)
Potassium reflex Magnesium: 6.1 mmol/L (ref 3.5–5.1)
Sodium: 138 mmol/L (ref 136–145)

## 2021-09-21 LAB — EKG 12-LEAD
Atrial Rate: 85 {beats}/min
P Axis: 57 degrees
P-R Interval: 168 ms
Q-T Interval: 402 ms
QRS Duration: 88 ms
QTc Calculation (Bazett): 478 ms
R Axis: -30 degrees
T Axis: 94 degrees
Ventricular Rate: 85 {beats}/min

## 2021-09-21 LAB — POTASSIUM: Potassium: 3.8 mmol/L (ref 3.5–5.1)

## 2021-09-21 MED ORDER — MORPHINE SULFATE 4 MG/ML IV SOLN
4 MG/ML | Freq: Once | INTRAVENOUS | Status: AC
Start: 2021-09-21 — End: 2021-09-21
  Administered 2021-09-21: 18:00:00 4 mg via INTRAVENOUS

## 2021-09-21 MED FILL — MORPHINE SULFATE 4 MG/ML IV SOLN: 4 mg/mL | INTRAVENOUS | Qty: 1

## 2021-09-21 NOTE — ED Provider Notes (Signed)
ED Attending Attestation Note     Date of evaluation: 09/21/2021    This patient was seen by the advance practice provider.  I have seen and examined the patient, agree with the workup, evaluation, management and diagnosis. The care plan has been discussed.  I have reviewed the ECG and concur with the APP's interpretation.  Briefly, this is a patient with past medical history notable for Alzheimer's who presents with unwitnessed but suspected mechanical fall complaining of left hip pain.  On exam, it appears obviously injured with some shortening and external rotation and I suspect pelvic fracture.  She is not on blood thinners.  Is at her mental status baseline per family who is present.    General: Patient is a elderly female in no acute distress  HEENT: Normocephalic, atraumatic, no Battle's sign or [place] eyes, pupils equal round and reactive to light, EOMI, sclera clear, no facial tenderness to palpation or step offs, no midface instability, no hemotympanum bilaterally, mucus membranes moist, no trismus, no jaw malocclusion, oropharynx WNL   Neck: Trachea midline, no lymphadenopathy, full ROM  Pulmonary: breath sounds present and equal bilaterally, no wheezes, rhonchi, or rales, no tachypnea or increased WOB  Cardiac: Regular rate and rhythm, normal S1S2, no murmurs, rubs, or gallops, no chest wall tenderness to palpation, visible injuries or palpable crepitus.   Abdomen: Soft, nondistended,  nontender, no rebound and no guarding.    Musculoskeletal: No obvious deformities, but there is significant tenderness to palpation of the left lower extremity near the greater trochanter.  The left leg is also shortened in appearance and held in knee flexion and external rotation at the hip.  Otherwise,no tenderness to palpation on remainder of exam, no midline C, T or L spine tenderness to palpation, full ROM in all extremities with no pain.  Pelvis stable to AP and lateral compression  Vascular: 2+ radial and DP pulses  bilaterally  Skin: Warm, dry, well perfused, no rashes, lacerations or abrasions  Neuro: GCS15, AAOx4. CN 2-12 intact. Sensation intact. Strength grossly equal and symmetric. Gait not assessed.        Theora Master, MD  09/21/21 1435

## 2021-09-21 NOTE — Progress Notes (Signed)
4 Eyes Skin Assessment     NAME:  Leslie Bradley  DATE OF BIRTH:  03/25/1928  MEDICAL RECORD NUMBER:  [id number]    The patient is being assessed for  Admission    I agree that at least one RN has performed a thorough Head to Toe Skin Assessment on the patient. ALL assessment sites listed below have been assessed.      Areas assessed by both nurses:    Head, Face, Ears, Shoulders, Back, Chest, Arms, Elbows, Hands, Sacrum. Buttock, Coccyx, Ischium, and Legs. Feet and Heels        Does the Patient have a Wound? Redness to groin and bottom.        Braden Prevention initiated by RN: Yes  Wound Care Orders initiated by RN: No    Pressure Injury (Stage 3,4, Unstageable, DTI, NWPT, and Complex wounds) if present, place Wound referral order by RN under ORDER ENTRY: No    New Ostomies, if present place, Ostomy referral order under ORDER ENTRY: No     Nurse 1 eSignature: Electronically signed by Dellis Filbert, RN on 09/22/21 at 1:22 AM EDT    **SHARE this note so that the co-signing nurse can place an eSignature**    Nurse 2 eSignature: Electronically signed by Ellsworth Lennox, RN on 09/22/21 at 3:14 AM EDT

## 2021-09-21 NOTE — ED Provider Notes (Cosign Needed)
THE [facility]  EMERGENCY DEPARTMENT ENCOUNTER          PHYSICIAN ASSISTANT NOTE       Date of evaluation: 09/21/2021    Chief Complaint     Fall and Hip Pain      History of Present Illness     Leslie Bradley is a 88 y.o. female who presents to the emergency department with left hip pain after fall.  Patient does have dementia unit at her nursing facility, therefore is unable to provide any of the history today.  While walking in the room her left hip is externally rotated and left leg is shortened compared to the right.  She complains of pain to the left hip.  I did call the patient's nursing facility who states that they are concerned that she tripped over another residents feet.  They state they heard her screaming and went over and she was lying on the ground holding onto someone's legs.  Patient is not anticoagulated.  The patient's daughter did come to the emergency department and states she is at her mental baseline.    ASSESSMENT / PLAN  (MEDICAL DECISION MAKING)     INITIAL VITALS: BP: (!) 170/87, Temp: 98.2 F (36.8 C), Pulse: 91, Respirations: 14, SpO2: 97 %    Leslie Bradley is a 88 y.o. female who presents the emergency room with left hip pain.  Patient hypertensive on presentation, remainder vital signs within normal limits.    Patient presents the emergency department left hip pain after fall.  She is a poor historian at baseline due to her dementia.  Her nursing facility states that they believe she tripped over another residence feet.  On presentation patient's left leg is externally rotated and shortened.  She has pain with any movement of the left hip.  No bony tenderness to the left knee, tibia/fibula, ankle, or foot.  No tenderness to the right lower extremity.  No tenderness throughout bilateral upper extremities.  No midline tenderness throughout the entirety of the spine.  No signs of trauma to head or neck.  Patient initially given 4 mg of morphine.  Screening labs were obtained  given concern for left hip fracture on my initial exam.  Chest x-ray along with x-ray of left hip and femur were ordered.  X-ray of left hip and femur showed a femoral neck acute transverse oblique fracture which is likely subcapital in position.  Mid to distal femur remains intact including the knee joint.  Chest x-ray without acute cardiopulmonary normality.  CBC unremarkable.  BMP did show a elevated potassium to 6.1, however this was hemolyzed.  We did obtain EKG which showed no peaked T waves or other concerning changes from previous.  I did reach out to orthopedics who was able to speak with the patient and family and ultimately recommends hemiarthroplasty of the left hip.  Call was placed to the patient's primary care provider to discuss admission.  I also placed a call to the AOD to discuss admission.    Is this patient to be included in the SEP-1 core measure? No Exclusion criteria - the patient is NOT to be included for SEP-1 Core Measure due to: Infection is not suspected    Medical Decision Making  Amount and/or Complexity of Data Reviewed  Labs: ordered. Decision-making details documented in ED Course.  Radiology: ordered. Decision-making details documented in ED Course.  ECG/medicine tests: ordered and independent interpretation performed. Decision-making details documented in ED Course.  Discussion of management  or test interpretation with external provider(s): I spoke with orthopedics who recommended admission for hemiarthroplasty of left hip    Risk  Prescription drug management.  Decision regarding hospitalization.        This patient was also evaluated by the attending physician. All care plans were discussed and agreed upon.    Clinical Impression     1. Closed fracture of left hip, initial encounter (HCC)        Disposition     PATIENT REFERRED TO:  No follow-up provider specified.    DISCHARGE MEDICATIONS:  New Prescriptions    No medications on file       DISPOSITION Admitted 09/21/2021  05:11:29 PM        Diagnostic Results and Other Data     RADIOLOGY:  XR CHEST PORTABLE   Final Result      Femoral neck acute transverse oblique fracture, likely subcapital in position      Mid to distal femur remains intact including the knee joint.      SINGLE AP VIEW OF THE PELVIS       HISTORY: Left hip and pelvic pain.      PROCEDURE: Single AP supine view of the pelvis was obtained      FINDINGS:      Impacted left femoral neck fracture is noted with superior migration of the   greater trochanter. Right femoral head and neck and proximal hip appear intact.      The remainder the pelvis is intact. There are no unusual calcifications.      IMPRESSION:      Impacted left femoral neck fracture with superior migration, likely subcapital   in position, transverse or oblique.      CHEST PORTABLE VIEW      HISTORY: Larey Seat, hip fracture, preoperative   COMPARISON: February 08, 2018      PROCEDURE: AP portable semierect upright view of the chest was obtained at 1435   hours      FINDINGS:       LINES: No central lines or tube      LUNGS: Lungs appear grossly clear      HEART: Heart size borderline. Atherosclerotic change of aorta      There is no evidence of pneumothorax      IMPRESSION:      1. No acute process or consolidation   2. Atherosclerotic aorta.               XR FEMUR LEFT (MIN 2 VIEWS)   Final Result      Femoral neck acute transverse oblique fracture, likely subcapital in position      Mid to distal femur remains intact including the knee joint.      SINGLE AP VIEW OF THE PELVIS       HISTORY: Left hip and pelvic pain.      PROCEDURE: Single AP supine view of the pelvis was obtained      FINDINGS:      Impacted left femoral neck fracture is noted with superior migration of the   greater trochanter. Right femoral head and neck and proximal hip appear intact.      The remainder the pelvis is intact. There are no unusual calcifications.      IMPRESSION:      Impacted left femoral neck fracture with superior  migration, likely subcapital   in position, transverse or oblique.      CHEST PORTABLE VIEW      HISTORY:  [place]ell, hip fracture, preoperative   COMPARISON: February 08, 2018      PROCEDURE: AP portable semierect upright view of the chest was obtained at 1435   hours      FINDINGS:       LINES: No central lines or tube      LUNGS: Lungs appear grossly clear      HEART: Heart size borderline. Atherosclerotic change of aorta      There is no evidence of pneumothorax      IMPRESSION:      1. No acute process or consolidation   2. Atherosclerotic aorta.               XR PELVIS (1-2 VIEWS)   Final Result      Femoral neck acute transverse oblique fracture, likely subcapital in position      Mid to distal femur remains intact including the knee joint.      SINGLE AP VIEW OF THE PELVIS       HISTORY: Left hip and pelvic pain.      PROCEDURE: Single AP supine view of the pelvis was obtained      FINDINGS:      Impacted left femoral neck fracture is noted with superior migration of the   greater trochanter. Right femoral head and neck and proximal hip appear intact.      The remainder the pelvis is intact. There are no unusual calcifications.      IMPRESSION:      Impacted left femoral neck fracture with superior migration, likely subcapital   in position, transverse or oblique.      CHEST PORTABLE VIEW      HISTORY: Larey Seat, hip fracture, preoperative   COMPARISON: February 08, 2018      PROCEDURE: AP portable semierect upright view of the chest was obtained at 1435   hours      FINDINGS:       LINES: No central lines or tube      LUNGS: Lungs appear grossly clear      HEART: Heart size borderline. Atherosclerotic change of aorta      There is no evidence of pneumothorax      IMPRESSION:      1. No acute process or consolidation   2. Atherosclerotic aorta.                   LABS:   Results for orders placed or performed during the hospital encounter of 09/21/21   CBC with Auto Differential   Result Value Ref Range    WBC 8.3 4.0 -  11.0 K/uL    RBC 3.90 (L) 4.00 - 5.20 M/uL    Hemoglobin 12.2 12.0 - 16.0 g/dL    Hematocrit 82.9 56.2 - 48.0 %    MCV 93.2 80.0 - 100.0 fL    MCH 31.4 26.0 - 34.0 pg    MCHC 33.7 31.0 - 36.0 g/dL    RDW 13.0 86.5 - 78.4 %    Platelets 270 135 - 450 K/uL    MPV 9.0 5.0 - 10.5 fL    Neutrophils % 65.2 %    Lymphocytes % 18.9 %    Monocytes % 12.7 %    Eosinophils % 2.4 %    Basophils % 0.8 %    Neutrophils Absolute 5.4 1.7 - 7.7 K/uL    Lymphocytes Absolute 1.6 1.0 - 5.1 K/uL    Monocytes Absolute 1.1 0.0 - 1.3 K/uL  Eosinophils Absolute 0.2 0.0 - 0.6 K/uL    Basophils Absolute 0.1 0.0 - 0.2 K/uL   BMP w/ Reflex to MG   Result Value Ref Range    Sodium 138 136 - 145 mmol/L    Potassium reflex Magnesium 6.1 (HH) 3.5 - 5.1 mmol/L    Chloride 100 99 - 110 mmol/L    CO2 29 21 - 32 mmol/L    Anion Gap 9 3 - 16    Glucose 126 (H) 70 - 99 mg/dL    BUN 14 7 - 20 mg/dL    Creatinine 1.0 0.6 - 1.2 mg/dL    Est, Glom Filt Rate 53 (A) >60    Calcium 8.6 8.3 - 10.6 mg/dL     EKG   Interpreted in conjunction with emergency department physician Theora Master, MD  Rhythm: normal sinus   Rate: normal  Axis: left  Ectopy: none  Conduction: normal  ST Segments: no acute change  T Waves: no acute change  Q Waves:nonspecific  Clinical Impression: no acute changes  Comparison:  02/08/18    ED BEDSIDE ULTRASOUND:  No results found.    RECENT VITALS:  BP: (!) 170/81, Temp: 98.2 F (36.8 C), Pulse: 86, Respirations: 13, SpO2: 92 %       ED Course     Nursing Notes, Past Medical Hx,Past Surgical Hx, Social Hx, Allergies, and Family Hx were reviewed.    ED Course as of 09/21/21 1742   Wed Sep 21, 2021   1528 Hx dementia, presenting with fall. Has femoral neck fx. DNR-CCA. Fam talking about plan.  [MM]      ED Course User Index  [MM] Randall An, MD       The patient was given the following medications:  Orders Placed This Encounter   Medications    morphine injection 4 mg       CONSULTS:  IP CONSULT TO ORTHOPEDIC SURGERY  IP CONSULT  TO PRIMARY CARE PROVIDER    Review of Systems     Review of Systems   Unable to perform ROS: Dementia     Past Medical, Surgical, Family, and Social History     She has a past medical history of Breast cancer (HCC) and Hypertension.  She has a past surgical history that includes Breast lumpectomy.  Her family history is not on file.  She reports that she has never smoked. She has never used smokeless tobacco. She reports that she does not drink alcohol and does not use drugs.    Medications     Previous Medications    AMLODIPINE (NORVASC) 2.5 MG TABLET    Take 1 tablet by mouth daily    FAMOTIDINE (PEPCID) 40 MG TABLET    Take 1 tablet by mouth 2 times daily    FUROSEMIDE (LASIX) 20 MG TABLET    Take 1 tablet by mouth daily    MELATONIN 5 MG TABS TABLET    Take 1 tablet by mouth nightly    MIRTAZAPINE (REMERON) 15 MG TABLET    Take 1 tablet by mouth nightly    VENLAFAXINE (EFFEXOR) 100 MG TABLET    TAKE 1 TABLET BY MOUTH TWICE A DAY       Allergies     She is allergic to aspirin, erythromycin, amoxicillin, and diflucan [fluconazole].    Physical Exam     INITIAL VITALS: BP: (!) 170/87, Temp: 98.2 F (36.8 C), Pulse: 91, Respirations: 14, SpO2: 97 %  Physical Exam  Constitutional:       General: She is not in acute distress.     Appearance: She is well-developed.   HENT:      Head: Normocephalic and atraumatic.   Eyes:      Pupils: Pupils are equal, round, and reactive to light.   Cardiovascular:      Rate and Rhythm: Normal rate and regular rhythm.      Heart sounds: No murmur heard.    No friction rub. No gallop.   Pulmonary:      Effort: Pulmonary effort is normal. No respiratory distress.      Breath sounds: Normal breath sounds.   Abdominal:      Palpations: Abdomen is soft.      Tenderness: There is no abdominal tenderness.   Musculoskeletal:         General: Normal range of motion.      Cervical back: Normal range of motion and neck supple.      Comments: Left leg is externally rotated and shortened compared  to the right.  She has significant pain with any movement of the left hip.  No bony tenderness to left knee, tibia/fibula, ankle, foot.  Full range of motion of right hip and knee without difficulty.  She has no bony tenderness throughout the right lower extremity.  No bony tenderness throughout bilateral upper extremities.  No midline tenderness of cervical, thoracic, lumbar spine.   Skin:     General: Skin is warm and dry.   Neurological:      Mental Status: She is alert and oriented to person, place, and time.           Maxie Barb, PA-C  09/21/21 1743

## 2021-09-21 NOTE — H&P (Signed)
Internal Medicine H&P    Date: 09/21/2021   Patient: Leslie Bradley   Patient DOB: [birth year]     CC: Fall and Hip Pain       Subjective     HPI:   Leslie Bradley is a 88 y.o. female with pmhx dementia and HTN who presents to the emergency department from assisted living with left hip pain after fall. Patient unable to provide history d/t dementia, facility reports they are concerned she tripped and fell, the fall was not witnessed. Staff alerted by patient screaming and found patient on the ground holding onto someone's legs. Patient not on any anticoagulation.     On arrival to ED patient afebrile, hypertensive, regular rate satting well on RA. On initial evaluation by ED, patient complaining of L hip pain, L leg shorter compared right. XR showed impacted L femoral neck fracture with superior migration. Seen by Ortho, patient has displaced left hip subcapital femur fracture, plan to operate tomorrow if patient medically stable. Per daughter patient is at baseline mental status. Patient admitted for further management.      PMHx:      Diagnosis Date    Breast cancer ([place])     Hypertension        PSurgHx:      Procedure Laterality Date    BREAST LUMPECTOMY          Medication:  Not in a hospital admission.    Allergies:  Aspirin, Erythromycin, Amoxicillin, and Diflucan [fluconazole]    SocHx:  Social History     Socioeconomic History    Marital status: Widowed   Tobacco Use    Smoking status: Never    Smokeless tobacco: Never   Substance and Sexual Activity    Alcohol use: Never    Drug use: Never    Sexual activity: Not Currently        FHx:  No family history on file.    ROS:  Review of Systems   Unable to perform ROS: Dementia        Objective     Vital Signs:  Patient Vitals for the past 8 hrs:   BP Temp Temp src Pulse Resp SpO2 Height Weight   09/21/21 1628 -- -- -- 86 13 92 % -- --   09/21/21 1627 -- -- -- 88 16 96 % -- --   09/21/21 1500 (!) 170/81 -- -- 88 16 97 % -- --   09/21/21 1415 (!) 170/70 --  -- 82 11 97 % -- --   09/21/21 1357 (!) 170/87 98.2 F (36.8 C) Oral 91 14 97 % '5\' 10"'  (1.778 m) 221 lb 3.2 oz (100.3 kg)       Physical Exam  Constitutional:       General: She is not in acute distress.     Appearance: She is obese.   HENT:      Head: Normocephalic.      Mouth/Throat:      Mouth: Mucous membranes are moist.   Cardiovascular:      Rate and Rhythm: Normal rate and regular rhythm.      Pulses: Normal pulses.   Pulmonary:      Effort: Pulmonary effort is normal.      Breath sounds: Normal breath sounds.   Abdominal:      General: Bowel sounds are normal.      Palpations: Abdomen is soft.   Musculoskeletal:      Cervical back: Normal range of motion.  Comments: L leg appears shorter than R, patient is tenderness and swelling in L hip area   Skin:     General: Skin is warm and dry.   Neurological:      Mental Status: Mental status is at baseline. She is disoriented.      Comments: Oriented to self only (with maiden name Leslie Bradley not married name), per daughter present this is her baseline          Labs:  CBC:   Recent Labs     09/21/21  1414   WBC 8.3   HGB 12.2   HCT 36.3   PLT 270       BMP:   Recent Labs     09/21/21  1414   NA 138   K 6.1*   CL 100   CO2 29   BUN 14   CREATININE 1.0   GLUCOSE 126*     Magnesium: No results for input(s): MG in the last 72 hours.  LFT's: No results for input(s): AST, ALT, ALB, BILITOT, ALKPHOS in the last 72 hours.    Troponin: No results for input(s): TROPONINI in the last 72 hours.  Lactic acid: No results for input(s): LACTATE in the last 72 hours.    BNP: No results for input(s): BNP in the last 72 hours.  Pro-BNP: No results for input(s): PROBNP in the last 72 hours.    Procalcitonin: No results for input(s): PROCAL in the last 72 hours.  CRP: No results for input(s): CRP in the last 72 hours.  ESR: No results for input(s): ESR in the last 72 hours.    ABGs: No results for input(s): PHART, PCO2ART, PO2ART in the last 72 hours.  VBGs: No results for input(s):  PHVEN, PCO2VEN, PO2VEN in the last 72 hours.    INR: No results for input(s): INR in the last 72 hours.    COVID-19: No results for input(s): COVID19 in the last 72 hours.    U/A: No results for input(s): NITRITE, COLORU, PHUR, LABCAST, WBCUA, RBCUA, MUCUS, TRICHOMONAS, YEAST, BACTERIA, CLARITYU, SPECGRAV, LEUKOCYTESUR, UROBILINOGEN, BILIRUBINUR, BLOODU, GLUCOSEU, KETONES, AMORPHOUS in the last 72 hours.    Radiology:  XR CHEST PORTABLE   Final Result      Femoral neck acute transverse oblique fracture, likely subcapital in position      Mid to distal femur remains intact including the knee joint.      SINGLE AP VIEW OF THE PELVIS       HISTORY: Left hip and pelvic pain.      PROCEDURE: Single AP supine view of the pelvis was obtained      FINDINGS:      Impacted left femoral neck fracture is noted with superior migration of the   greater trochanter. Right femoral head and neck and proximal hip appear intact.      The remainder the pelvis is intact. There are no unusual calcifications.      IMPRESSION:      Impacted left femoral neck fracture with superior migration, likely subcapital   in position, transverse or oblique.      CHEST PORTABLE VIEW      HISTORY: [place], hip fracture, preoperative   COMPARISON: February 08, 2018      PROCEDURE: AP portable semierect upright view of the chest was obtained at 1435   hours      FINDINGS:       LINES: No central lines or tube      LUNGS: Lungs  appear grossly clear      HEART: Heart size borderline. Atherosclerotic change of aorta      There is no evidence of pneumothorax      IMPRESSION:      1. No acute process or consolidation   2. Atherosclerotic aorta.               XR FEMUR LEFT (MIN 2 VIEWS)   Final Result      Femoral neck acute transverse oblique fracture, likely subcapital in position      Mid to distal femur remains intact including the knee joint.      SINGLE AP VIEW OF THE PELVIS       HISTORY: Left hip and pelvic pain.      PROCEDURE: Single AP supine view of the  pelvis was obtained      FINDINGS:      Impacted left femoral neck fracture is noted with superior migration of the   greater trochanter. Right femoral head and neck and proximal hip appear intact.      The remainder the pelvis is intact. There are no unusual calcifications.      IMPRESSION:      Impacted left femoral neck fracture with superior migration, likely subcapital   in position, transverse or oblique.      CHEST PORTABLE VIEW      HISTORY: [place], hip fracture, preoperative   COMPARISON: February 08, 2018      PROCEDURE: AP portable semierect upright view of the chest was obtained at 1435   hours      FINDINGS:       LINES: No central lines or tube      LUNGS: Lungs appear grossly clear      HEART: Heart size borderline. Atherosclerotic change of aorta      There is no evidence of pneumothorax      IMPRESSION:      1. No acute process or consolidation   2. Atherosclerotic aorta.               XR PELVIS (1-2 VIEWS)   Final Result      Femoral neck acute transverse oblique fracture, likely subcapital in position      Mid to distal femur remains intact including the knee joint.      SINGLE AP VIEW OF THE PELVIS       HISTORY: Left hip and pelvic pain.      PROCEDURE: Single AP supine view of the pelvis was obtained      FINDINGS:      Impacted left femoral neck fracture is noted with superior migration of the   greater trochanter. Right femoral head and neck and proximal hip appear intact.      The remainder the pelvis is intact. There are no unusual calcifications.      IMPRESSION:      Impacted left femoral neck fracture with superior migration, likely subcapital   in position, transverse or oblique.      CHEST PORTABLE VIEW      HISTORY: [place], hip fracture, preoperative   COMPARISON: February 08, 2018      PROCEDURE: AP portable semierect upright view of the chest was obtained at 1435   hours      FINDINGS:       LINES: No central lines or tube      LUNGS: Lungs appear grossly clear      HEART: Heart size  borderline. Atherosclerotic change of aorta  There is no evidence of pneumothorax      IMPRESSION:      1. No acute process or consolidation   2. Atherosclerotic aorta.                     Assessment & Plan     Displaced L Hip Subcapital Femur Fracture   Seen by ortho in ED, to go to OR with Orthopedist Dr. Irish Lack for L hip hemiarthroplasty tomorrow.  RCSI 0, Class I risk. Medically optimized for surgery.     - NPO at midnight   - OR tomorrow   - Ortho consulted, appreciate recs   - Will need PT/OT following surgery    Chronic Medical Conditions    HTN   - Continue home amlodipine 5 mg   - Continue home lasix 20 mg     Anxiety   - Continue home venlafaxine 100 mg BID    ACP and Code Status   - patient to be full code in order to have surgery tomorrow, immediately following surgery to be DNR-CCA. Patient has ACP docs, not in system, family should bring in at earliest convenience.    DVT PPx: none d/t surgery  Diet: Diet NPO  ADULT DIET; Regular   Code status:  Patient to be full code for surgery per family, DNR-CCA after    ELOS: 2  Barriers to discharge: surgery  Disposition  - Preadmission: Assisted living, memory care unit   - Current: [facility]  - Upon discharge: TBD    Will discuss with attending physician Dr. Eddie North, MD     Krista Blue, MD  Internal Medicine, PGY-1

## 2021-09-21 NOTE — Consults (Signed)
Attending   Consult Note        Reason for Consult:  left hip pain  Requesting Physician: Randall An, MD  Date of Service: 09/21/2021 4:15 PM    CHIEF COMPLAINT:  As Above    History Obtained From:  patient, family member - multiple members including son who is physician in [place]    HISTORY OF PRESENT ILLNESS:                The patient is a 88 y.o. female who presents with above chief complaint.  Baseline Alzheimer's but recent fall and left hip pain. Xrays in ER showed displaced left hip subcapital femur fx.    Past Medical History:        Diagnosis Date    Breast cancer (HCC)     Hypertension      Past Surgical History:        Procedure Laterality Date    BREAST LUMPECTOMY           Medications Prior to Admission:   Prior to Admission medications    Medication Sig Start Date End Date Taking? Authorizing Provider   amLODIPine (NORVASC) 2.5 MG tablet Take 1 tablet by mouth daily 09/09/18   Carolina Cellar, MD   amLODIPine (NORVASC) 5 MG tablet Take 1 tablet by mouth daily With 2.5 mg for 7.5mg  total daily 09/09/18   Carolina Cellar, MD   venlafaxine (EFFEXOR) 100 MG tablet TAKE 1 TABLET BY MOUTH TWICE A DAY 09/09/18   Carolina Cellar, MD   Multiple Vitamins-Minerals (MULTIVITAMIN ADULT PO) Take by mouth    Historical Provider, MD   FEXOFENADINE HCL PO Take by mouth    Historical Provider, MD   BIOTIN FORTE PO Take by mouth    Historical Provider, MD   Cyanocobalamin (B-12 PO) Take by mouth    Historical Provider, MD       Allergies:  Aspirin, Erythromycin, Amoxicillin, and Diflucan [fluconazole]    Social History:    Tobacco:  reports that she has never smoked. She has never used smokeless tobacco.   Alcohol:  reports no history of alcohol use.   Illicit Drug: No  Family History:   No family history on file.    REVIEW OF SYSTEMS:   All additional Review of Systems were reviewed and noted to be negative or noncontributory today.   CONSTITUTIONAL:  negative  MUSCULOSKELETAL:  positive for  pain    PHYSICAL EXAM:    awake,  alert, cooperative, no apparent distress, and appears stated age  MUSCULOSKELETAL:  there is no redness, warmth, or swelling of the joints  full range of motion noted  motor strength is 5 out of 5 all extremities bilaterally  tone is normal  with exception of  LEFT HIP:  redness absent  warmth absent  swelling present  tenderness present - mostly at anterior groin  range of motion - tender with any slight IR/ER    DATA:    CBC:   Recent Labs     09/21/21  1414   WBC 8.3   HGB 12.2   PLT 270     BMP:  No results for input(s): NA, K, CL, CO2, BUN, CREATININE, GLUCOSE in the last 72 hours.  INR: No results for input(s): INR in the last 72 hours.    Radiology:   XR CHEST PORTABLE   Final Result      Femoral neck acute transverse oblique fracture, likely subcapital in position  Mid to distal femur remains intact including the knee joint.      SINGLE AP VIEW OF THE PELVIS       HISTORY: Left hip and pelvic pain.      PROCEDURE: Single AP supine view of the pelvis was obtained      FINDINGS:      Impacted left femoral neck fracture is noted with superior migration of the   greater trochanter. Right femoral head and neck and proximal hip appear intact.      The remainder the pelvis is intact. There are no unusual calcifications.      IMPRESSION:      Impacted left femoral neck fracture with superior migration, likely subcapital   in position, transverse or oblique.      CHEST PORTABLE VIEW      HISTORY: Larey Seat, hip fracture, preoperative   COMPARISON: February 08, 2018      PROCEDURE: AP portable semierect upright view of the chest was obtained at 1435   hours      FINDINGS:       LINES: No central lines or tube      LUNGS: Lungs appear grossly clear      HEART: Heart size borderline. Atherosclerotic change of aorta      There is no evidence of pneumothorax      IMPRESSION:      1. No acute process or consolidation   2. Atherosclerotic aorta.               XR FEMUR LEFT (MIN 2 VIEWS)   Final Result      Femoral neck acute  transverse oblique fracture, likely subcapital in position      Mid to distal femur remains intact including the knee joint.      SINGLE AP VIEW OF THE PELVIS       HISTORY: Left hip and pelvic pain.      PROCEDURE: Single AP supine view of the pelvis was obtained      FINDINGS:      Impacted left femoral neck fracture is noted with superior migration of the   greater trochanter. Right femoral head and neck and proximal hip appear intact.      The remainder the pelvis is intact. There are no unusual calcifications.      IMPRESSION:      Impacted left femoral neck fracture with superior migration, likely subcapital   in position, transverse or oblique.      CHEST PORTABLE VIEW      HISTORY: Larey Seat, hip fracture, preoperative   COMPARISON: February 08, 2018      PROCEDURE: AP portable semierect upright view of the chest was obtained at 1435   hours      FINDINGS:       LINES: No central lines or tube      LUNGS: Lungs appear grossly clear      HEART: Heart size borderline. Atherosclerotic change of aorta      There is no evidence of pneumothorax      IMPRESSION:      1. No acute process or consolidation   2. Atherosclerotic aorta.               XR PELVIS (1-2 VIEWS)   Final Result      Femoral neck acute transverse oblique fracture, likely subcapital in position      Mid to distal femur remains intact including the knee joint.      SINGLE AP  VIEW OF THE PELVIS       HISTORY: Left hip and pelvic pain.      PROCEDURE: Single AP supine view of the pelvis was obtained      FINDINGS:      Impacted left femoral neck fracture is noted with superior migration of the   greater trochanter. Right femoral head and neck and proximal hip appear intact.      The remainder the pelvis is intact. There are no unusual calcifications.      IMPRESSION:      Impacted left femoral neck fracture with superior migration, likely subcapital   in position, transverse or oblique.      CHEST PORTABLE VIEW      HISTORY: Larey Seat, hip fracture,  preoperative   COMPARISON: February 08, 2018      PROCEDURE: AP portable semierect upright view of the chest was obtained at 1435   hours      FINDINGS:       LINES: No central lines or tube      LUNGS: Lungs appear grossly clear      HEART: Heart size borderline. Atherosclerotic change of aorta      There is no evidence of pneumothorax      IMPRESSION:      1. No acute process or consolidation   2. Atherosclerotic aorta.                      IMPRESSION/RECOMMENDATIONS:    Assessment: Displaced left hip subcapital femur fracture    Plan:  1) Discussed with multiple family members including son by phone that this injury will be best treated with surgical intervention including likely left hip hemiarthroplasty.    2) All questions answered and all family agree with ultimate plans    3) For expediency and timing the surgery will likely be undertaken by my partner Dr. Nunzio Cobbs who is also fellowship trained in adult reconstructive surgery. Family aware of likelihood that an [facility] partner will be involved.    4) Anticipate surgery tomorrow 09/22/21 if medically stable. Please keep patient NPO after midnight.    Thank you for the opportunity to consult on this patient.    Gunnar Fusi, MD  [phone] (cell)  [phone] (appointment scheduling)      It should be noted that a total time spent with >50% of the time coordinating patient care and decision making was 70 minutes.

## 2021-09-21 NOTE — Progress Notes (Signed)
Pt admitted to 5511 at 3. Pt axo to self which is baseline. 4 eye assessment complete. Oriented to room, use of call light, and bedrest orders. Puriwick in place. Consent for surgical procedure obtained from daughter. Tolerating PO intake fluids. No acute neuro deficits noted. 4 eye assessment complete. All fall precautions in place. Bed locked and in lowest position with alarm on. Call light in reach.

## 2021-09-21 NOTE — ED Notes (Signed)
ED TO INPATIENT SBAR HANDOFF    Patient Name: Leslie Bradley   DOB:  29-May-1928  88 y.o.   MRN:  [id number]  Preferred Name  Leslie Bradley  ED Room #:  A09/A09-09  Family/Caregiver Present: yes  Restraints no   Sitter no   Sepsis Risk Score Sepsis Risk Score: 0.76    Situation  Code Status: Prior No additional code details.    Allergies: Aspirin, Erythromycin, Amoxicillin, and Diflucan [fluconazole]  Weight: Patient Vitals for the past 96 hrs (Last 3 readings):   Weight   09/21/21 1357 221 lb 3.2 oz (100.3 kg)     Arrived from: nursing home  Chief Complaint:   Chief Complaint   Patient presents with    Fall    Hip Pain     Hospital Problem/Diagnosis:  Principal Problem:    Subcapital fracture of femur, left, closed, initial encounter (HCC)  Resolved Problems:    * No resolved hospital problems. *    Imaging:   XR CHEST PORTABLE   Final Result      Femoral neck acute transverse oblique fracture, likely subcapital in position      Mid to distal femur remains intact including the knee joint.      SINGLE AP VIEW OF THE PELVIS       HISTORY: Left hip and pelvic pain.      PROCEDURE: Single AP supine view of the pelvis was obtained      FINDINGS:      Impacted left femoral neck fracture is noted with superior migration of the   greater trochanter. Right femoral head and neck and proximal hip appear intact.      The remainder the pelvis is intact. There are no unusual calcifications.      IMPRESSION:      Impacted left femoral neck fracture with superior migration, likely subcapital   in position, transverse or oblique.      CHEST PORTABLE VIEW      HISTORY: Larey Seat, hip fracture, preoperative   COMPARISON: February 08, 2018      PROCEDURE: AP portable semierect upright view of the chest was obtained at 1435   hours      FINDINGS:       LINES: No central lines or tube      LUNGS: Lungs appear grossly clear      HEART: Heart size borderline. Atherosclerotic change of aorta      There is no evidence of pneumothorax      IMPRESSION:       1. No acute process or consolidation   2. Atherosclerotic aorta.               XR FEMUR LEFT (MIN 2 VIEWS)   Final Result      Femoral neck acute transverse oblique fracture, likely subcapital in position      Mid to distal femur remains intact including the knee joint.      SINGLE AP VIEW OF THE PELVIS       HISTORY: Left hip and pelvic pain.      PROCEDURE: Single AP supine view of the pelvis was obtained      FINDINGS:      Impacted left femoral neck fracture is noted with superior migration of the   greater trochanter. Right femoral head and neck and proximal hip appear intact.      The remainder the pelvis is intact. There are no unusual calcifications.      IMPRESSION:  Impacted left femoral neck fracture with superior migration, likely subcapital   in position, transverse or oblique.      CHEST PORTABLE VIEW      HISTORY: Larey Seat, hip fracture, preoperative   COMPARISON: February 08, 2018      PROCEDURE: AP portable semierect upright view of the chest was obtained at 1435   hours      FINDINGS:       LINES: No central lines or tube      LUNGS: Lungs appear grossly clear      HEART: Heart size borderline. Atherosclerotic change of aorta      There is no evidence of pneumothorax      IMPRESSION:      1. No acute process or consolidation   2. Atherosclerotic aorta.               XR PELVIS (1-2 VIEWS)   Final Result      Femoral neck acute transverse oblique fracture, likely subcapital in position      Mid to distal femur remains intact including the knee joint.      SINGLE AP VIEW OF THE PELVIS       HISTORY: Left hip and pelvic pain.      PROCEDURE: Single AP supine view of the pelvis was obtained      FINDINGS:      Impacted left femoral neck fracture is noted with superior migration of the   greater trochanter. Right femoral head and neck and proximal hip appear intact.      The remainder the pelvis is intact. There are no unusual calcifications.      IMPRESSION:      Impacted left femoral neck fracture  with superior migration, likely subcapital   in position, transverse or oblique.      CHEST PORTABLE VIEW      HISTORY: Larey Seat, hip fracture, preoperative   COMPARISON: February 08, 2018      PROCEDURE: AP portable semierect upright view of the chest was obtained at 1435   hours      FINDINGS:       LINES: No central lines or tube      LUNGS: Lungs appear grossly clear      HEART: Heart size borderline. Atherosclerotic change of aorta      There is no evidence of pneumothorax      IMPRESSION:      1. No acute process or consolidation   2. Atherosclerotic aorta.                 Abnormal labs:   Abnormal Labs Reviewed   CBC WITH AUTO DIFFERENTIAL - Abnormal; Notable for the following components:       Result Value    RBC 3.90 (*)     All other components within normal limits   BASIC METABOLIC PANEL W/ REFLEX TO MG FOR LOW K - Abnormal; Notable for the following components:    Potassium reflex Magnesium 6.1 (*)     Glucose 126 (*)     Est, Glom Filt Rate 53 (*)     All other components within normal limits    Narrative:     CALL  [facility]  [facility] tel. [phone],  Chemistry results called to and read back by [facility], 09/21/2021  16:54, by Julien Nordmann     Critical values:      Abnormal Assessment Findings:     Background  History:   Past Medical History:  Diagnosis Date    Breast cancer (HCC)     Hypertension        Assessment    Vitals/MEWS:        Vitals:    09/21/21 1627 09/21/21 1628 09/21/21 1800 09/21/21 1845   BP:   (!) 155/68 (!) 159/75   Pulse: 88 86 89 89   Resp: 16 13 18 19    Temp:       TempSrc:       SpO2: 96% 92% 93%    Weight:       Height:         FiO2 (%):   O2 Flow Rate:      Cardiac Rhythm:    Pain Assessment:  [x]  Verbal []  Scale  Pain Scale: Pain Assessment  Pain Assessment: None - Denies Pain  Last documented pain score (0-10 scale)    Last documented pain medication administered: Morphine 4mg  @ 1422  Mental Status: alert  Orientation Level:    NIH Score:    C-SSRS: Risk of Suicide: No  Risk  Bedside swallow:    Glasgow Coma Scale (GCS):    Active LDA's:   Peripheral IV 09/21/21 Right Antecubital (Active)   Site Assessment Clean, dry & intact 09/21/21 1413   Line Status Flushed;Normal saline locked;Blood return noted 09/21/21 1413   Phlebitis Assessment No symptoms 09/21/21 1413   Infiltration Assessment 0 09/21/21 1413     PO Status:   Pertinent or High Risk Medications/Drips: no   If Yes, please provide details:   Pending Blood Product Administration: no       You may also review the ED PT Care Timeline found under the Summary Nursing Index tab.    Recommendation    Pending orders   Plan for Discharge (if known):   Additional Comments:    If any further questions, please call Sending RN at [phone]    Electronically signed by: Electronically signed by 09/23/21, RN on 09/21/2021 at 7:08 PM       09/23/21, RN  09/21/21 1909

## 2021-09-21 NOTE — Unmapped (Signed)
This is a notification of an ED/Admission Alert generated by [facility]. This patient visited the following location: [facility] ([facility])Admit Date: 09/21/2021 13:40Visit Type: EmergencyChief complaint: Fall Diagnosis:  ()Alert Category: Attending Physician:   Referring Physician: Consulting Physician: Copied Physician(s): [facility] is a not-for-profit corporation that was founded in 1997 as a community effort to enhance the ability to share health information electronically in the [place] area. Today, [employer] is one of the nations largest and most financially sustainable regional health information exchange (HIE) organizations.

## 2021-09-21 NOTE — Unmapped (Signed)
This is a notification of an ED/Admission Alert generated by [facility]. This patient visited the following location: [facility] ([facility]Admit Date: 09/21/2021 13:40Visit Type: InpatientChief complaint: Fracture of unspecified part of neck of Diagnosis:    (S72.002A)Alert Category: Attending Physician: <PV1.7.2>MANN</PV1.7.2><PV1.7.2>SAXENA</PV1.7.2>, <PV1.7.3>MARLISA</PV1.7.3><PV1.7.3>SYDNEY</PV1.7.3>Referring Physician: Consulting Physician: Jannetta Quint, SYDNEYCopied Physician(s): [facility]   is a not-for-profit corporation that was founded in 1997 as a community effort to enhance the ability to share health information electronically in the [place] area. Today, [employer] is one of the nations largest and most   financially sustainable regional health information exchange (HIE) organizations.

## 2021-09-22 ENCOUNTER — Inpatient Hospital Stay: Admit: 2021-09-22 | Payer: MEDICARE

## 2021-09-22 LAB — CBC WITH AUTO DIFFERENTIAL
Basophils %: 0.3 %
Basophils Absolute: 0 10*3/uL (ref 0.0–0.2)
Eosinophils %: 3.6 %
Eosinophils Absolute: 0.3 10*3/uL (ref 0.0–0.6)
Hematocrit: 39.6 % (ref 36.0–48.0)
Hemoglobin: 13 g/dL (ref 12.0–16.0)
Lymphocytes %: 12.8 %
Lymphocytes Absolute: 1 10*3/uL (ref 1.0–5.1)
MCH: 31 pg (ref 26.0–34.0)
MCHC: 33 g/dL (ref 31.0–36.0)
MCV: 94.2 fL (ref 80.0–100.0)
MPV: 9.2 fL (ref 5.0–10.5)
Monocytes %: 11.4 %
Monocytes Absolute: 0.9 10*3/uL (ref 0.0–1.3)
Neutrophils %: 71.9 %
Neutrophils Absolute: 5.6 10*3/uL (ref 1.7–7.7)
Platelets: 219 10*3/uL (ref 135–450)
RBC: 4.2 M/uL (ref 4.00–5.20)
RDW: 13.6 % (ref 12.4–15.4)
WBC: 7.7 10*3/uL (ref 4.0–11.0)

## 2021-09-22 LAB — BASIC METABOLIC PANEL W/ REFLEX TO MG FOR LOW K
Anion Gap: 10 (ref 3–16)
BUN: 14 mg/dL (ref 7–20)
CO2: 28 mmol/L (ref 21–32)
Calcium: 9.1 mg/dL (ref 8.3–10.6)
Chloride: 99 mmol/L (ref 99–110)
Creatinine: 0.8 mg/dL (ref 0.6–1.2)
Est, Glom Filt Rate: >60 (ref >60)
Glucose: 114 mg/dL — ABNORMAL HIGH (ref 70–99)
Potassium reflex Magnesium: 4.2 mmol/L (ref 3.5–5.1)
Sodium: 137 mmol/L (ref 136–145)

## 2021-09-22 LAB — POCT GLUCOSE
POC Glucose: 111 mg/dl — ABNORMAL HIGH (ref 70–99)
POC Glucose: 108 mg/dl — ABNORMAL HIGH (ref 70–99)

## 2021-09-22 LAB — MAGNESIUM: Magnesium: 2.1 mg/dL (ref 1.80–2.40)

## 2021-09-22 MED ORDER — NORMAL SALINE FLUSH 0.9 % IV SOLN
0.9 % | INTRAVENOUS | Status: AC | PRN
Start: 2021-09-22 — End: 2021-09-28

## 2021-09-22 MED ORDER — PROPOFOL 200 MG/20ML IV EMUL
200 MG/20ML | INTRAVENOUS | Status: AC
Start: 2021-09-22 — End: ?

## 2021-09-22 MED ORDER — CEFAZOLIN SODIUM 2 G SOLR (MIXTURES ONLY)
2 g | Freq: Three times a day (TID) | INTRAVENOUS | Status: AC
Start: 2021-09-22 — End: 2021-09-23
  Administered 2021-09-23 (×2): 2000 mg via INTRAVENOUS

## 2021-09-22 MED ORDER — BUPIVACAINE-EPINEPHRINE (PF) 0.5% -1:200000 IJ SOLN
INTRAMUSCULAR | Status: DC | PRN
Start: 2021-09-22 — End: 2021-09-22
  Administered 2021-09-22: 20:00:00 20 via INTRADERMAL

## 2021-09-22 MED ORDER — DEXTROSE 10 % IV SOLN
10 % | INTRAVENOUS | Status: AC | PRN
Start: 2021-09-22 — End: 2021-09-28

## 2021-09-22 MED ORDER — NORMAL SALINE FLUSH 0.9 % IV SOLN
0.9 % | INTRAVENOUS | Status: DC | PRN
Start: 2021-09-22 — End: 2021-09-22

## 2021-09-22 MED ORDER — LIDOCAINE (CARDIAC) 100 MG/5ML IV SOLN (MIXTURES ONLY)
100 MG/5ML | INTRAVENOUS | Status: AC
Start: 2021-09-22 — End: ?

## 2021-09-22 MED ORDER — LABETALOL HCL 5 MG/ML IV SOLN
5 MG/ML | INTRAVENOUS | Status: DC | PRN
Start: 2021-09-22 — End: 2021-09-22

## 2021-09-22 MED ORDER — DEXTROSE 10 % IV BOLUS
INTRAVENOUS | Status: AC | PRN
Start: 2021-09-22 — End: 2021-09-28

## 2021-09-22 MED ORDER — MELATONIN 5 MG PO TBDP
5 MG | Freq: Every evening | ORAL | Status: AC | PRN
Start: 2021-09-22 — End: 2021-09-28

## 2021-09-22 MED ORDER — PHENYLEPHRINE HCL (PRESSORS) 10 MG/ML IV SOLN
10 MG/ML | INTRAVENOUS | Status: AC
Start: 2021-09-22 — End: ?

## 2021-09-22 MED ORDER — DEXAMETHASONE SODIUM PHOSPHATE 4 MG/ML IJ SOLN
4 MG/ML | INTRAMUSCULAR | Status: DC | PRN
Start: 2021-09-22 — End: 2021-09-22
  Administered 2021-09-22: 20:00:00 4 via INTRAVENOUS

## 2021-09-22 MED ORDER — IPRATROPIUM-ALBUTEROL 0.5-2.5 (3) MG/3ML IN SOLN
Freq: Once | RESPIRATORY_TRACT | Status: DC | PRN
Start: 2021-09-22 — End: 2021-09-22

## 2021-09-22 MED ORDER — MIRTAZAPINE 15 MG PO TABS
15 MG | Freq: Every evening | ORAL | Status: AC
Start: 2021-09-22 — End: 2021-09-28
  Administered 2021-09-22 – 2021-09-28 (×7): 15 mg via ORAL

## 2021-09-22 MED ORDER — GLUCOSE 4 G PO CHEW
4 g | ORAL | Status: AC | PRN
Start: 2021-09-22 — End: 2021-09-28

## 2021-09-22 MED ORDER — CEFAZOLIN SODIUM 1 G IJ SOLR
1 g | INTRAMUSCULAR | Status: DC | PRN
Start: 2021-09-22 — End: 2021-09-22
  Administered 2021-09-22: 19:00:00 2 via INTRAVENOUS

## 2021-09-22 MED ORDER — ONDANSETRON HCL 4 MG/2ML IJ SOLN
4 MG/2ML | Freq: Once | INTRAMUSCULAR | Status: DC | PRN
Start: 2021-09-22 — End: 2021-09-22

## 2021-09-22 MED ORDER — FUROSEMIDE 20 MG PO TABS
20 MG | Freq: Every day | ORAL | Status: AC
Start: 2021-09-22 — End: 2021-09-28
  Administered 2021-09-22 – 2021-09-28 (×6): 20 mg via ORAL

## 2021-09-22 MED ORDER — LIDOCAINE (CARDIAC) 100 MG/5ML IV SOLN (MIXTURES ONLY)
100 MG/5ML | INTRAVENOUS | Status: DC | PRN
Start: 2021-09-22 — End: 2021-09-22
  Administered 2021-09-22: 18:00:00 100 via INTRAVENOUS

## 2021-09-22 MED ORDER — SUGAMMADEX SODIUM 200 MG/2ML IV SOLN
200 MG/2ML | INTRAVENOUS | Status: AC
Start: 2021-09-22 — End: ?

## 2021-09-22 MED ORDER — GLUCAGON (RDNA) 1 MG IJ KIT
1 MG | INTRAMUSCULAR | Status: AC | PRN
Start: 2021-09-22 — End: 2021-09-28

## 2021-09-22 MED ORDER — HYDROMORPHONE HCL PF 1 MG/ML IJ SOLN
1 | INTRAMUSCULAR | Status: DC | PRN
Start: 2021-09-22 — End: 2021-09-23
  Administered 2021-09-22 – 2021-09-23 (×6): 0.25 mg via INTRAVENOUS

## 2021-09-22 MED ORDER — ROCURONIUM BROMIDE 50 MG/5ML IV SOLN
50 MG/5ML | INTRAVENOUS | Status: DC | PRN
Start: 2021-09-22 — End: 2021-09-22
  Administered 2021-09-22: 20:00:00 10 via INTRAVENOUS
  Administered 2021-09-22: 19:00:00 50 via INTRAVENOUS

## 2021-09-22 MED ORDER — AMLODIPINE BESYLATE 5 MG PO TABS
5 MG | Freq: Every day | ORAL | Status: AC
Start: 2021-09-22 — End: 2021-09-28
  Administered 2021-09-22 – 2021-09-28 (×7): 5 mg via ORAL

## 2021-09-22 MED ORDER — OXYCODONE HCL 5 MG PO TABS
5 | ORAL | Status: DC | PRN
Start: 2021-09-22 — End: 2021-09-28
  Administered 2021-09-23 – 2021-09-27 (×10): 5 mg via ORAL

## 2021-09-22 MED ORDER — TRANEXAMIC ACID 1000 MG/10ML IV SOLN
1000 MG/10ML | INTRAVENOUS | Status: DC | PRN
Start: 2021-09-22 — End: 2021-09-22
  Administered 2021-09-22 (×2): 1000

## 2021-09-22 MED ORDER — VANCOMYCIN HCL 1 G IV SOLR
1 g | INTRAVENOUS | Status: AC
Start: 2021-09-22 — End: ?

## 2021-09-22 MED ORDER — POLYETHYLENE GLYCOL 3350 17 G PO PACK
17 g | Freq: Every day | ORAL | Status: AC | PRN
Start: 2021-09-22 — End: 2021-09-23

## 2021-09-22 MED ORDER — ACETAMINOPHEN 325 MG PO TABS
325 | Freq: Four times a day (QID) | ORAL | Status: DC | PRN
Start: 2021-09-22 — End: 2021-09-22

## 2021-09-22 MED ORDER — VANCOMYCIN HCL 1 G IV SOLR
1 g | INTRAVENOUS | Status: DC | PRN
Start: 2021-09-22 — End: 2021-09-22
  Administered 2021-09-22: 20:00:00 1000 via TOPICAL

## 2021-09-22 MED ORDER — TRANEXAMIC ACID 1000 MG/10ML IV SOLN
1000 MG/10ML | INTRAVENOUS | Status: AC
Start: 2021-09-22 — End: ?

## 2021-09-22 MED ORDER — ONDANSETRON HCL 4 MG/2ML IJ SOLN
4 MG/2ML | INTRAMUSCULAR | Status: AC
Start: 2021-09-22 — End: ?

## 2021-09-22 MED ORDER — LACTATED RINGERS IV SOLN
INTRAVENOUS | Status: AC
Start: 2021-09-22 — End: 2021-09-22
  Administered 2021-09-22: 16:00:00 via INTRAVENOUS

## 2021-09-22 MED ORDER — CEFAZOLIN SODIUM 1 G IJ SOLR
1 g | INTRAMUSCULAR | Status: AC
Start: 2021-09-22 — End: ?

## 2021-09-22 MED ORDER — METHOCARBAMOL 500 MG PO TABS
500 MG | Freq: Three times a day (TID) | ORAL | Status: AC
Start: 2021-09-22 — End: 2021-09-28
  Administered 2021-09-23 – 2021-09-28 (×17): 500 mg via ORAL

## 2021-09-22 MED ORDER — ONDANSETRON 4 MG PO TBDP
4 MG | Freq: Three times a day (TID) | ORAL | Status: AC | PRN
Start: 2021-09-22 — End: 2021-09-28

## 2021-09-22 MED ORDER — VENLAFAXINE HCL 75 MG PO TABS
75 MG | Freq: Two times a day (BID) | ORAL | Status: AC
Start: 2021-09-22 — End: 2021-09-28
  Administered 2021-09-22 – 2021-09-28 (×12): 100 mg via ORAL

## 2021-09-22 MED ORDER — SUCCINYLCHOLINE CHLORIDE 200 MG/10ML IV SOSY
200 MG/10ML | INTRAVENOUS | Status: DC | PRN
Start: 2021-09-22 — End: 2021-09-22
  Administered 2021-09-22: 18:00:00 160 via INTRAVENOUS

## 2021-09-22 MED ORDER — ACETAMINOPHEN 650 MG RE SUPP
650 MG | Freq: Four times a day (QID) | RECTAL | Status: DC | PRN
Start: 2021-09-22 — End: 2021-09-22

## 2021-09-22 MED ORDER — ONDANSETRON HCL 4 MG/2ML IJ SOLN
4 MG/2ML | INTRAMUSCULAR | Status: DC | PRN
Start: 2021-09-22 — End: 2021-09-22
  Administered 2021-09-22: 20:00:00 4 via INTRAVENOUS

## 2021-09-22 MED ORDER — OXYCODONE HCL 5 MG PO TABS
5 MG | ORAL | Status: AC | PRN
Start: 2021-09-22 — End: 2021-09-28
  Administered 2021-09-23 – 2021-09-28 (×2): 10 mg via ORAL

## 2021-09-22 MED ORDER — NORMAL SALINE FLUSH 0.9 % IV SOLN
0.9 % | Freq: Two times a day (BID) | INTRAVENOUS | Status: DC
Start: 2021-09-22 — End: 2021-09-22

## 2021-09-22 MED ORDER — LACTATED RINGERS IV SOLN
INTRAVENOUS | Status: DC | PRN
Start: 2021-09-22 — End: 2021-09-22
  Administered 2021-09-22: 18:00:00 via INTRAVENOUS

## 2021-09-22 MED ORDER — ROPIVACAINE HCL 5 MG/ML IJ SOLN
INTRAMUSCULAR | Status: AC
Start: 2021-09-22 — End: 2021-09-22
  Administered 2021-09-22: 18:00:00 30 via PERINEURAL

## 2021-09-22 MED ORDER — PROCHLORPERAZINE EDISYLATE 10 MG/2ML IJ SOLN
10 MG/2ML | Freq: Once | INTRAMUSCULAR | Status: DC | PRN
Start: 2021-09-22 — End: 2021-09-22

## 2021-09-22 MED ORDER — FENTANYL CITRATE (PF) 100 MCG/2ML IJ SOLN
100 MCG/2ML | INTRAMUSCULAR | Status: DC | PRN
Start: 2021-09-22 — End: 2021-09-22

## 2021-09-22 MED ORDER — ROCURONIUM BROMIDE 50 MG/5ML IV SOLN
50 MG/5ML | INTRAVENOUS | Status: AC
Start: 2021-09-22 — End: ?

## 2021-09-22 MED ORDER — HYDROMORPHONE HCL PF 1 MG/ML IJ SOLN
1 MG/ML | INTRAMUSCULAR | Status: DC | PRN
Start: 2021-09-22 — End: 2021-09-22
  Administered 2021-09-22: 21:00:00 0.5 mg via INTRAVENOUS

## 2021-09-22 MED ORDER — SUCCINYLCHOLINE CHLORIDE 200 MG/10ML IV SOSY
200 MG/10ML | INTRAVENOUS | Status: AC
Start: 2021-09-22 — End: ?

## 2021-09-22 MED ORDER — ACETAMINOPHEN 650 MG RE SUPP
650 | Freq: Four times a day (QID) | RECTAL | Status: DC | PRN
Start: 2021-09-22 — End: 2021-09-22

## 2021-09-22 MED ORDER — FENTANYL CITRATE (PF) 100 MCG/2ML IJ SOLN
100 MCG/2ML | INTRAMUSCULAR | Status: DC | PRN
Start: 2021-09-22 — End: 2021-09-22
  Administered 2021-09-22 (×2): 25 via INTRAVENOUS
  Administered 2021-09-22: 20:00:00 50 via INTRAVENOUS

## 2021-09-22 MED ORDER — DEXAMETHASONE SODIUM PHOSPHATE 4 MG/ML IJ SOLN
4 MG/ML | INTRAMUSCULAR | Status: AC
Start: 2021-09-22 — End: ?

## 2021-09-22 MED ORDER — FAMOTIDINE 20 MG PO TABS
20 MG | Freq: Every day | ORAL | Status: AC
Start: 2021-09-22 — End: 2021-09-28
  Administered 2021-09-22 – 2021-09-28 (×7): 20 mg via ORAL

## 2021-09-22 MED ORDER — PROPOFOL 200 MG/20ML IV EMUL
200 MG/20ML | INTRAVENOUS | Status: DC | PRN
Start: 2021-09-22 — End: 2021-09-22
  Administered 2021-09-22: 18:00:00 100 via INTRAVENOUS

## 2021-09-22 MED ORDER — NORMAL SALINE FLUSH 0.9 % IV SOLN
0.9 % | Freq: Two times a day (BID) | INTRAVENOUS | Status: AC
Start: 2021-09-22 — End: 2021-09-28
  Administered 2021-09-22 – 2021-09-28 (×11): 10 mL via INTRAVENOUS

## 2021-09-22 MED ORDER — ACETAMINOPHEN 500 MG PO TABS
500 MG | Freq: Three times a day (TID) | ORAL | Status: AC
Start: 2021-09-22 — End: 2021-09-28
  Administered 2021-09-23 – 2021-09-28 (×15): 1000 mg via ORAL

## 2021-09-22 MED ORDER — ACETAMINOPHEN 325 MG PO TABS
325 MG | Freq: Four times a day (QID) | ORAL | Status: DC | PRN
Start: 2021-09-22 — End: 2021-09-22
  Administered 2021-09-22: 11:00:00 650 mg via ORAL

## 2021-09-22 MED ORDER — ACETAMINOPHEN 325 MG PO TABS
325 MG | Freq: Once | ORAL | Status: DC | PRN
Start: 2021-09-22 — End: 2021-09-22

## 2021-09-22 MED ORDER — BUPIVACAINE-EPINEPHRINE (PF) 0.5% -1:200000 IJ SOLN
INTRAMUSCULAR | Status: AC
Start: 2021-09-22 — End: ?

## 2021-09-22 MED ORDER — ONDANSETRON HCL 4 MG/2ML IJ SOLN
4 MG/2ML | Freq: Four times a day (QID) | INTRAMUSCULAR | Status: AC | PRN
Start: 2021-09-22 — End: 2021-09-28
  Administered 2021-09-23: 14:00:00 4 mg via INTRAVENOUS

## 2021-09-22 MED ORDER — FENTANYL CITRATE (PF) 100 MCG/2ML IJ SOLN
100 MCG/2ML | INTRAMUSCULAR | Status: AC
Start: 2021-09-22 — End: ?

## 2021-09-22 MED ORDER — ASPIRIN 81 MG PO CHEW
81 MG | Freq: Two times a day (BID) | ORAL | Status: DC
Start: 2021-09-22 — End: 2021-09-28
  Administered 2021-09-23 – 2021-09-28 (×12): 81 mg via ORAL

## 2021-09-22 MED ORDER — SODIUM CHLORIDE 0.9 % IV SOLN
0.9 % | INTRAVENOUS | Status: DC | PRN
Start: 2021-09-22 — End: 2021-09-22

## 2021-09-22 MED ORDER — SODIUM CHLORIDE 0.9 % IV SOLN
0.9 % | INTRAVENOUS | Status: AC | PRN
Start: 2021-09-22 — End: 2021-09-28

## 2021-09-22 MED FILL — FAMOTIDINE 20 MG PO TABS: 20 MG | ORAL | Qty: 1

## 2021-09-22 MED FILL — VENLAFAXINE HCL 25 MG PO TABS: 25 MG | ORAL | Qty: 1

## 2021-09-22 MED FILL — SUCCINYLCHOLINE CHLORIDE 200 MG/10ML IV SOSY: 200 MG/10ML | INTRAVENOUS | Qty: 10

## 2021-09-22 MED FILL — ACETAMINOPHEN 325 MG PO TABS: 325 MG | ORAL | Qty: 2

## 2021-09-22 MED FILL — FENTANYL CITRATE (PF) 100 MCG/2ML IJ SOLN: 100 MCG/2ML | INTRAMUSCULAR | Qty: 2

## 2021-09-22 MED FILL — AMLODIPINE BESYLATE 5 MG PO TABS: 5 MG | ORAL | Qty: 1

## 2021-09-22 MED FILL — DIPRIVAN 200 MG/20ML IV EMUL: 200 MG/20ML | INTRAVENOUS | Qty: 20

## 2021-09-22 MED FILL — HYDROMORPHONE HCL 1 MG/ML IJ SOLN: 1 MG/ML | INTRAMUSCULAR | Qty: 1

## 2021-09-22 MED FILL — TRANEXAMIC ACID 1000 MG/10ML IV SOLN: 1000 MG/10ML | INTRAVENOUS | Qty: 20

## 2021-09-22 MED FILL — ONDANSETRON HCL 4 MG/2ML IJ SOLN: 4 MG/2ML | INTRAMUSCULAR | Qty: 2

## 2021-09-22 MED FILL — BRIDION 200 MG/2ML IV SOLN: 200 MG/2ML | INTRAVENOUS | Qty: 2

## 2021-09-22 MED FILL — SENSORCAINE-MPF/EPINEPHRINE 0.5% -1:200000 IJ SOLN: INTRAMUSCULAR | Qty: 30

## 2021-09-22 MED FILL — LIDOCAINE HCL (CARDIAC) 100 MG/5ML IV SOSY: 100 MG/5ML | INTRAVENOUS | Qty: 5

## 2021-09-22 MED FILL — PHENYLEPHRINE HCL (PRESSORS) 10 MG/ML IV SOLN: 10 MG/ML | INTRAVENOUS | Qty: 1

## 2021-09-22 MED FILL — FUROSEMIDE 20 MG PO TABS: 20 MG | ORAL | Qty: 1

## 2021-09-22 MED FILL — ROCURONIUM BROMIDE 50 MG/5ML IV SOLN: 50 MG/5ML | INTRAVENOUS | Qty: 5

## 2021-09-22 MED FILL — VANCOMYCIN HCL 1 G IV SOLR: 1 g | INTRAVENOUS | Qty: 1000

## 2021-09-22 MED FILL — CEFAZOLIN SODIUM 1 G IJ SOLR: 1 g | INTRAMUSCULAR | Qty: 2000

## 2021-09-22 MED FILL — MIRTAZAPINE 15 MG PO TABS: 15 MG | ORAL | Qty: 1

## 2021-09-22 MED FILL — DEXAMETHASONE SODIUM PHOSPHATE 4 MG/ML IJ SOLN: 4 MG/ML | INTRAMUSCULAR | Qty: 5

## 2021-09-22 NOTE — Anesthesia Post-Procedure Evaluation (Signed)
Department of Anesthesiology  Postprocedure Note    Patient: Leslie Bradley  MRN: [id number]  Birthdate: 1928/06/04  Date of evaluation: 09/22/2021      Procedure Summary     Date: 09/22/21 Room / Location: [facility] OR 08 / [facility]    Anesthesia Start: 1415 Anesthesia Stop: 1630    Procedure: LEFT HIP HEMIARTHROPLASTY ANTERIOR APPROACH (Left: Hip) Diagnosis:       Closed fracture of left hip, initial encounter (HCC)      (Closed fracture of left hip, initial encounter (HCC) [S72.002A])    Surgeons: Nunzio Cobbs, MD Responsible Provider: Anastasia Pall, MD    Anesthesia Type: general ASA Status: 2          Anesthesia Type: No value filed.    Aldrete Phase I: Aldrete Score: 8    Aldrete Phase II:        Anesthesia Post Evaluation    Patient location during evaluation: PACU  Patient participation: complete - patient participated  Level of consciousness: awake  Pain score: 0  Nausea & Vomiting: no nausea and no vomiting  Complications: no  Cardiovascular status: blood pressure returned to baseline  Respiratory status: acceptable  Hydration status: euvolemic  Pain management: adequate

## 2021-09-22 NOTE — Progress Notes (Addendum)
PACU Transfer Note    Vitals:    09/22/21 1745   BP: 112/70   Pulse: 99   Resp: 15   Temp: 98.2 F (36.8 C)   SpO2: 91%       In: 100 [I.V.:100]  Out: 200     Pain assessment:    Pain Level: 4    Report given to Receiving unit RN. Transferred to 5511 with Leslie Bradley.    09/22/2021 5:50 PM

## 2021-09-22 NOTE — Op Note (Signed)
Operative Note      Patient: Leslie Bradley  Date of Birth: 16-Mar-1928  MRN: [id number]    Date of Procedure: 2021/10/16    Pre-Op Diagnosis Codes:     * Closed fracture of left hip, initial encounter ([place]) [S72.002A]    Post-Op Diagnosis: Same       Procedure(s):  LEFT HIP HEMIARTHROPLASTY ANTERIOR APPROACH    Surgeon(s):  Lindie Spruce, MD    CPT code: [phone]    Assistant:   Surgical Assistant: Eileen Stanford    Anesthesia: General    Estimated Blood Loss (mL): 952     Complications: None    Specimens:   * No specimens in log *    Implants:  Tamala Julian and Nephew 4 standard cemented polarstem  Smith and Nephew Oxinium 5m + 0 femoral head  Smith and Nephew 47mOD tandem bipolar shell      Drains: none    Findings: complete displaced left femoral neck fracture      Detailed Description of Procedure:     INDICATIONS AND CONSENT: Leslie Bradley a [age].o. [facility] with immediate onset of left hip pain.  The patient was unable to ambulate afterward.  The patient was brought to the hospital where x-rays demonstrated a complete displaced femoral neck fracture.  The diagnosis and treatment options were discussed with the patient and the family.  Nonoperative intervention was very briefly discussed.  This would render the patient unable to ambulate.  This was clearly not an option as the patient would not be able to do activities of daily living.  Operative intervention in the form of a left hip hemiarthroplasty was discussed.  The risks, benefits, indications, and alternatives were discussed at length.  The risks include bleeding, infection, pneumonias, heart complications, blood clots, loss of limb, loss of life, damage to surrounding structures including nerves, arteries, veins, tendons, numbness in the LFCN distribution, tingling, periprosthetic fracture, foot drop, dislocation, leg length discrepancy, need for revision surgery.  After a very lengthy  discussion, the patient and the family agreed to proceed with surgical intervention.  Informed consent was obtained.    DETAILS OF PROCEDURE:      The patient was met in the preoperative holding area where site, laterality, procedure being performed were confirmed with the patient and the daughter.  The patient was then taken back to the operating room where anesthesia sign in was performed confirming the same.  The patient was then induced and intubated on the hospital bed.  Boots were placed onto the patient and the patient was safely moved over to the [place].  The ipsilateral upper extremity was placed over top of the body and the left lower extremity was subsequently prepped with alcohol and chlorhexidine and then draped in a sterile fashion.  Surgical time-out was performed.      Antibiotics were administered.  TXA was administered.  We turned our attention to the left hip.  10 cm incision was made 2 fingerbreadths distal and 2 fingerbreadths lateral to the anterior superior iliac spine through skin and subcutaneous tissue.  The tensor fascia was incised with a new knife and a tonsil was used to elevate the fascia.  The intermuscular interval was found and finger dissected.  Cobra was placed over the superior femoral neck.  Hibbs retractors were used to retract the rectus femoris medially and the tensor fascia lata laterally.  The ascending branch of the lateral  femoral circumflex artery and its vena comantantes were dissected and coagulated appropriately.  The deep fascia was incised revealing pericapsular fat.  The pericapsular fat was removed and a blunt Cobra was placed over the inferior femoral neck.  Cobb elevator was used to dissect medially over the anterior wall and a Hohmann retractor was placed over the anterior wall exposing the entire anterior capsule.  T capsulectomy was then performed the from superolateral to inferomedial and then along the middle of the femoral neck to the anterior wall.   The capsule was removed and retractors were replaced along the bone.  Saw was then used to cut the femoral neck approximately 10-15 mm proximal to the level of the lesser trochanter.  Once this cut was completed, the leg was pulled and slight traction and external rotation.  The wafer of bone was removed and the femoral head was removed with a corkscrew and sized on the back table.      Attention was then turned to the femur.  The leg was externally rotated and distal and proximal releases of the capsule were performed.  Femoral neck elevator was placed medially and wide sharp Hohmann was placed behind the greater trochanter elevating the proximal femur up and out of the wound.  Excess capsule was removed off the posterolateral aspect of the femur.  The femur was then subsequently cannulated with a box osteotome followed by canal rasp.  Sequential broaching using the Smith and Nephew polar stem system was used starting at the smallest stem all the way up to a size 4.  Standard offset neck was placed in trial bipolar head was placed.  Retractors were removed and the hip was reduced via abduction hip flexion and internal rotation with traction.  X-ray demonstrated appropriate leg lengths on AP pelvis.  AP and lateral of the right hip demonstrated that the stem was in the appropriate position and in the femoral canal.    Bone hook was used to dislocate the hip along with traction and external rotation.  The leg was extended and adducted.  Trial components were removed.  Cement restrictor was placed approximately 16 cm distal to the medial calcar.  Canal brush was used to remove excess cancellous bone.  Long irrigator aspirate her was used to cleanse the entire femur and remove excess bone.  Tampon was used to cleanse and dried the bone.  Two packs of Simplex P without antibiotic cement was mixed on the back table into cement gun.  Once the cement was ready and the femur was dry, the tampon was removed and cement was  introduced into the femoral canal.  It was hand pressurized.  The stem was then subsequently placed in neutral version.  Excess cement was removed and the stem was held in place until the cement cured.  Once the cement had completely cured, a 1m bipolar head was placed onto the trunnion.  The hip was then subsequently reduced.  AP pelvis demonstrated appropriate leg lengths.  AP of the hip demonstrated an excellent cement mantle and appropriate stem placement.  External rotation to 110 degrees of the hip demonstrated the stem was in the femoral shaft and there was excellent anterior and posterior cement mantle as well.    And neutral extension, the hip was externally rotated to 110 degrees and was stable.  At approximately 45 degrees of extension the hip was rotated 100 degrees and deemed stable.  The hip was then brought back up into neutral rotation.  Hibbs retractors were placed and the wound was copiously irrigated with 1 L of normal saline.  1 g of vancomycin powder was introduced into the wound.  The tensor fascia was closed with a running 1. Stratafix suture.  The subcutaneous tissues closed with 2-0 Vicryl sutures.  Skin was closed with a running 3-0 Stratafix suture.  Prineo dressing was placed over top of the wound.  Mepilex Ag silver impregnated dressing was placed over top the Prineo.    At the end of the case all sponge and needle counts were correct.  The drapes were removed, the patient was moved from the [place] table back to the hospital bed.  Boots were removed, the patient was then extubated, and then taken to PACU in a safe fashion.    POST OP PLAN:       Activity: start PT/OT   ROM: ROMAT LLE  Antibiotics: Ancef for 24 hours  Weight Bearing Status: WBAT LLE  Pain: oral multimodal  Diet: return to regular diet  Drains/foley: none  DVT PPx: ASA 44m BID for 4 weeks   Imaging: post op low AP pelvis in PACU  Wound: monocryl, prineo  Dressing: mepilex AG, remove POD 8  Follow-up: 4 weeks  Dispo: to  floor, SNF      Electronically signed by SLindie Spruce MD on 09/22/2021 at 4:06 PM

## 2021-09-22 NOTE — Plan of Care (Signed)
Problem: Pain  Goal: Verbalizes/displays adequate comfort level or baseline comfort level  Outcome: Progressing  Pain being managed per MAR. Endorses some relief with intervention.      Problem: Safety - Adult  Goal: Free from fall injury  Outcome: Progressing  Pt free of fall and injury. All fall precautions in place. Bed locked and in lowest position.      Problem: Neurosensory - Adult  Goal: Achieves stable or improved neurological status  Outcome: Progressing  Pt neuro status stable at baseline function.

## 2021-09-22 NOTE — Progress Notes (Signed)
Progress Note    Admit Date: 09/21/2021  Day: 2  Diet: Diet NPO    CC: Fall and hip pain    Interval history:   Pt seen and examined at bedside. Overall feeling uncomfortable with left hip pain. Endorses chest pain, but reports that this is substernal, burning in sensation, and does not radiate anywhere else. No sob or abdominal pain. Scheduled for orthopaedic intervention today, pt currently NPO. Also with limited urine output (100 mL) since yesterday.    HPI:     Leslie Bradley is a 88 y.o. female with pmhx dementia and HTN who presents to the emergency department from assisted living with left hip pain after fall. Patient unable to provide history d/t dementia, facility reports they are concerned she tripped and fell, the fall was not witnessed. Staff alerted by patient screaming and found patient on the ground holding onto someone's legs. Patient not on any anticoagulation.      On arrival to ED patient afebrile, hypertensive, regular rate satting well on RA. On initial evaluation by ED, patient complaining of L hip pain, L leg shorter compared right. XR showed impacted L femoral neck fracture with superior migration. Seen by Ortho, patient has displaced left hip subcapital femur fracture, plan to operate tomorrow if patient medically stable. Per daughter patient is at baseline mental status. Patient admitted for further management.    Medications:     Scheduled Meds:   famotidine  20 mg Oral Daily    mirtazapine  15 mg Oral Nightly    amLODIPine  5 mg Oral Daily    furosemide  20 mg Oral Daily    venlafaxine  100 mg Oral BID WC    sodium chloride flush  5-40 mL IntraVENous 2 times per day     Continuous Infusions:   lactated ringers IV soln      sodium chloride      dextrose       PRN Meds:acetaminophen **OR** acetaminophen, melatonin, sodium chloride flush, sodium chloride, ondansetron **OR** ondansetron, polyethylene glycol, glucose, dextrose bolus **OR** dextrose bolus, glucagon (rDNA), dextrose,  HYDROmorphone    Objective:   Vitals:   T-max:  Patient Vitals for the past 8 hrs:   BP Temp Temp src Pulse Resp SpO2   09/22/21 1000 (!) 150/77 -- -- -- -- --   09/22/21 0745 (!) 150/77 98.3 F (36.8 C) Oral 85 16 92 %   09/22/21 0647 -- -- -- -- 16 --       Intake/Output Summary (Last 24 hours) at 09/22/2021 1112  Last data filed at 09/22/2021 0300  Gross per 24 hour   Intake 120 ml   Output --   Net 120 ml     Review of systems  A 10 point ROS was conducted with pertinent positives and negatives listed as above    Physical Exam  Constitutional:       General: She is not in acute distress.     Appearance: She is obese.   HENT:      Head: Normocephalic.      Mouth/Throat:      Mouth: Mucous membranes are moist.   Cardiovascular:      Rate and Rhythm: Normal rate and regular rhythm.      Pulses: Normal pulses.   Pulmonary:      Effort: Pulmonary effort is normal.      Breath sounds: Normal breath sounds.   Abdominal:      General: Bowel sounds are normal.  Palpations: Abdomen is soft.   Musculoskeletal:      Cervical back: Normal range of motion.      Comments: L leg appears shorter than R, patient is tenderness and swelling in L hip area   Skin:     General: Skin is warm and dry.   Neurological:      Mental Status: Mental status is at baseline. She is disoriented.      Comments: Oriented to self only (with maiden name Leslie Bradley not married name), per daughter this is her baseline         LABS:    CBC:   Recent Labs     09/21/21  1414 09/22/21  0720   WBC 8.3 7.7   HGB 12.2 13.0   HCT 36.3 39.6   PLT 270 219   MCV 93.2 94.2     Renal:    Recent Labs     09/21/21  1414 09/21/21  1811 09/22/21  0720   NA 138  --  137   K 6.1* 3.8 4.2   CL 100  --  99   CO2 29  --  28   BUN 14  --  14   CREATININE 1.0  --  0.8   GLUCOSE 126*  --  114*   CALCIUM 8.6  --  9.1   MG  --   --  2.10   ANIONGAP 9  --  10     Hepatic: No results for input(s): AST, ALT, BILITOT, BILIDIR, PROT, LABALBU, ALKPHOS in the last 72 hours.  Troponin:  No results for input(s): TROPONINI in the last 72 hours.  BNP: No results for input(s): BNP in the last 72 hours.  Lipids: No results for input(s): CHOL, HDL in the last 72 hours.    Invalid input(s): LDLCALCU, TRIGLYCERIDE  ABGs:  No results for input(s): PHART, PCO2ART, PO2ART, HCO3ART, BEART, THGBART, O2SATART, TCO2ART in the last 72 hours.    INR: No results for input(s): INR in the last 72 hours.  Lactate: No results for input(s): LACTATE in the last 72 hours.  Cultures:  -----------------------------------------------------------------  RAD:   XR CHEST PORTABLE   Final Result      Femoral neck acute transverse oblique fracture, likely subcapital in position      Mid to distal femur remains intact including the knee joint.      SINGLE AP VIEW OF THE PELVIS       HISTORY: Left hip and pelvic pain.      PROCEDURE: Single AP supine view of the pelvis was obtained      FINDINGS:      Impacted left femoral neck fracture is noted with superior migration of the   greater trochanter. Right femoral head and neck and proximal hip appear intact.      The remainder the pelvis is intact. There are no unusual calcifications.      IMPRESSION:      Impacted left femoral neck fracture with superior migration, likely subcapital   in position, transverse or oblique.      CHEST PORTABLE VIEW      HISTORY: Larey Seat, hip fracture, preoperative   COMPARISON: February 08, 2018      PROCEDURE: AP portable semierect upright view of the chest was obtained at 1435   hours      FINDINGS:       LINES: No central lines or tube      LUNGS: Lungs appear grossly clear  HEART: Heart size borderline. Atherosclerotic change of aorta      There is no evidence of pneumothorax      IMPRESSION:      1. No acute process or consolidation   2. Atherosclerotic aorta.               XR FEMUR LEFT (MIN 2 VIEWS)   Final Result      Femoral neck acute transverse oblique fracture, likely subcapital in position      Mid to distal femur remains intact including  the knee joint.      SINGLE AP VIEW OF THE PELVIS       HISTORY: Left hip and pelvic pain.      PROCEDURE: Single AP supine view of the pelvis was obtained      FINDINGS:      Impacted left femoral neck fracture is noted with superior migration of the   greater trochanter. Right femoral head and neck and proximal hip appear intact.      The remainder the pelvis is intact. There are no unusual calcifications.      IMPRESSION:      Impacted left femoral neck fracture with superior migration, likely subcapital   in position, transverse or oblique.      CHEST PORTABLE VIEW      HISTORY: Larey Seat, hip fracture, preoperative   COMPARISON: February 08, 2018      PROCEDURE: AP portable semierect upright view of the chest was obtained at 1435   hours      FINDINGS:       LINES: No central lines or tube      LUNGS: Lungs appear grossly clear      HEART: Heart size borderline. Atherosclerotic change of aorta      There is no evidence of pneumothorax      IMPRESSION:      1. No acute process or consolidation   2. Atherosclerotic aorta.               XR PELVIS (1-2 VIEWS)   Final Result      Femoral neck acute transverse oblique fracture, likely subcapital in position      Mid to distal femur remains intact including the knee joint.      SINGLE AP VIEW OF THE PELVIS       HISTORY: Left hip and pelvic pain.      PROCEDURE: Single AP supine view of the pelvis was obtained      FINDINGS:      Impacted left femoral neck fracture is noted with superior migration of the   greater trochanter. Right femoral head and neck and proximal hip appear intact.      The remainder the pelvis is intact. There are no unusual calcifications.      IMPRESSION:      Impacted left femoral neck fracture with superior migration, likely subcapital   in position, transverse or oblique.      CHEST PORTABLE VIEW      HISTORY: Larey Seat, hip fracture, preoperative   COMPARISON: February 08, 2018      PROCEDURE: AP portable semierect upright view of the chest was obtained  at 1435   hours      FINDINGS:       LINES: No central lines or tube      LUNGS: Lungs appear grossly clear      HEART: Heart size borderline. Atherosclerotic change of aorta      There is no  evidence of pneumothorax      IMPRESSION:      1. No acute process or consolidation   2. Atherosclerotic aorta.                   Assessment/Plan:     Displaced L Hip Subcapital Femur Fracture   Seen by ortho in ED, to go to OR with Orthopedist Dr. Ricki Miller for L hip hemiarthroplasty tomorrow.  RCSI 0, Class I risk. Medically optimized for surgery.   - NPO; OR today   - Ortho consulted, appreciate recs   - Will need PT/OT following surgery     Limited urine output  - 100 mL/hr LR continuous, pt currently NPO    Chronic Medical Conditions     HTN   - Continue home amlodipine 5 mg   - Continue home lasix 20 mg      Anxiety   - Continue home venlafaxine 100 mg BID     ACP and Code Status   - patient to be full code in order to have surgery, immediately following surgery to be DNR-CCA. Patient has ACP docs, not in system, family should bring in at earliest convenience.    Code Status: Full code for surgery per family, DNR-CCA after  FEN: NPO  PPX: none d/t surgery  DISPO: GGMF    Girard Cooter, MD, PGY-1  09/22/21  11:12 AM    This patient has been staffed and discussed with Virgilio Belling, MD.

## 2021-09-22 NOTE — Care Coordination-Inpatient (Signed)
Case Management Assessment  Initial Evaluation    Date/Time of Evaluation: 09/22/2021 1:39 PM  Assessment Completed by: Jordan Hawks, RN    If patient is discharged prior to next notation, then this note serves as note for discharge by case management.    Patient Name: Leslie Bradley                   Date of Birth: [birth year]  Diagnosis: Closed fracture of left hip, initial encounter [facility]) [S72.002A]  Subcapital fracture of femur, left, closed, initial encounter ([place]) [S72.012A]                   Date / Time: 09/21/2021  1:40 PM    Patient Admission Status: Inpatient   Readmission Risk (Low < 19, Mod (19-27), High > 27): Readmission Risk Score: 9.6    Current PCP: No primary care provider on file.  PCP verified by CM? Yes    Chart Reviewed: Yes      History Provided by: Child/Family  Patient Orientation: Person    Patient Cognition: Dementia / Early Alzheimer's    Hospitalization in the last 30 days (Readmission):  No    If yes, Readmission Assessment in CM Navigator will be completed.    Advance Directives:      Code Status: DNR-CCA   Patient's Primary Decision Maker is: Legal Next of Kin Honor Loh)      Discharge Planning:    Patient lives with: Other (Comment) [facility]) Type of Home: Other (Comment) (Memory Care)  Primary Care Giver: Private caregiver ([place])  Patient Support Systems include: Other (Comment) (memory care)   Current Financial resources: None  Current community resources: None  Current services prior to admission: Other (Comment) ([place])            Current DME:              Type of Home Care services:  None    ADLS  Prior functional level: Assistance with the following:, Housework, Shopping, Cooking, Toileting  Current functional level:      PT AM-PAC:   /24  OT AM-PAC:   /24    Family can provide assistance at DC: No  Would you like Case Management to discuss the discharge plan with any other family members/significant others, and if so, who?  Yes Santiago Glad)  Plans to Return to Present Housing: Unknown at present  Other Identified Issues/Barriers to RETURNING to current housing: may need SNF vs memory care  Potential Assistance needed at discharge: [place], Other (Comment) ([place])            Potential DME:    Patient expects to discharge to: Unknown  Plan for transportation at discharge: Family    Financial    Payor: [employer] MEDICARE / Plan: [place] PPO / Product Type: Medicare /     Does insurance require precert for SNF: Yes    Potential assistance Purchasing Medications: No  Meds-to-Beds request: No      [id number] [place], [place] - [address] - P [phone]- F [phone]  [place] [id number] Phone: [phone]: [phone]   [id number] [place], [phone] [id number] [id number]: [phone]: [phone]    Notes:    Factors facilitating achievement of predicted  outcomes: Family support, Caregiver support, and Ple[place]    Barriers to discharge: Pain, Limited insight into deficits, and Decreased endurance    Additional Case Management Notes: CM met with pt's daughter Santiago Glad at bedside. Pt with advanced dementia.Pt resides at [facility]. Pt had full physical capacity and ambulated without device. Unsure of needs at dc. Cm discussed with Santiago Glad option of returning back to [facility] vs SNF. CM provided [facility] SNF list and general SNF list. Santiago Glad will review and will meet with CM tomorrow with 2-3 choices for referrals. Santiago Glad would like to see how pt does post-operatively prior to making final disposition decision. If pt does well enough, it may be in pt's best interest to return to memory care.     The Plan for Transition of Care is related to the following treatment goals of Closed fracture of left hip, initial encounter ([place]) [S72.002A]  Subcapital  fracture of femur, left, closed, initial encounter ([place]) [P82.423N]    IF APPLICABLE: The Patient and/or patient representative Deauna and her family were provided with a choice of provider and agrees with the discharge plan. Freedom of choice list with basic dialogue that supports the patient's individualized plan of care/goals and shares the quality data associated with the providers was provided to: Patient   Patient Representative Name:       The Patient and/or Patient Representative Agree with the Discharge Plan? Yes    Jordan Hawks, RN  Case Management Department  Ph: [phone]

## 2021-09-22 NOTE — Plan of Care (Signed)
Problem: Pain  Goal: Verbalizes/displays adequate comfort level or baseline comfort level  09/22/2021 1258 by Kerri Perches, RN  Outcome: Progressing  Flowsheets (Taken 09/22/2021 1258)  Verbalizes/displays adequate comfort level or baseline comfort level:   Encourage patient to monitor pain and request assistance   Assess pain using appropriate pain scale  Note: Patient will have pain managed by utilizing both pharmacological and non pharmacological interventions.   09/22/2021 0124 by Dellis Filbert, RN  Outcome: Progressing     Problem: Safety - Adult  Goal: Free from fall injury  09/22/2021 1258 by Kerri Perches, RN  Outcome: Progressing  Note: Patient will remain fall free during stay by implementing and following all safety precautions.

## 2021-09-22 NOTE — Anesthesia Pre-Procedure Evaluation (Signed)
Department of Anesthesiology  Preprocedure Note       Name:  Leslie Bradley   Age:  88 y.o.  DOB:  Feb 10, 1928                                          MRN:  [id number]         Date:  09/22/2021      Surgeon: Moishe Spice):  Nunzio Cobbs, MD    Procedure: Procedure(s):  LEFT HIP HEMIARTHROPLASTY ANTERIOR APPROACH    Medications prior to admission:   Prior to Admission medications    Medication Sig Start Date End Date Taking? Authorizing Provider   famotidine (PEPCID) 40 MG tablet Take 1 tablet by mouth 2 times daily   Yes Historical Provider, MD   furosemide (LASIX) 20 MG tablet Take 1 tablet by mouth daily   Yes Historical Provider, MD   melatonin 5 MG TABS tablet Take 1 tablet by mouth nightly   Yes Historical Provider, MD   mirtazapine (REMERON) 15 MG tablet Take 1 tablet by mouth nightly   Yes Historical Provider, MD   venlafaxine (EFFEXOR) 100 MG tablet TAKE 1 TABLET BY MOUTH TWICE A DAY 09/09/18   Carolina Cellar, MD       Current medications:    Current Facility-Administered Medications   Medication Dose Route Frequency Provider Last Rate Last Admin   . acetaminophen (TYLENOL) tablet 650 mg  650 mg Oral Q6H PRN Girard Cooter, MD   650 mg at 09/22/21 0277    Or   . acetaminophen (TYLENOL) suppository 650 mg  650 mg Rectal Q6H PRN Girard Cooter, MD       . lactated ringers IV soln infusion   IntraVENous Continuous Girard Cooter, MD 100 mL/hr at 09/22/21 1147 New Bag at 09/22/21 1147   . famotidine (PEPCID) tablet 20 mg  20 mg Oral Daily Gilda Crease, MD   20 mg at 09/22/21 0959   . mirtazapine (REMERON) tablet 15 mg  15 mg Oral Nightly Gilda Crease, MD   15 mg at 09/21/21 2217   . melatonin disintegrating tablet 5 mg  5 mg Oral Nightly PRN Gilda Crease, MD       . amLODIPine [facility]) tablet 5 mg  5 mg Oral Daily Gilda Crease, MD   5 mg at 09/22/21 1000   . furosemide (LASIX) tablet 20 mg  20 mg Oral Daily Gilda Crease, MD   20 mg at 09/22/21 1000   . venlafaxine (EFFEXOR) tablet 100 mg  100 mg Oral BID WC Gilda Crease, MD   100 mg at 09/22/21 1000   . sodium chloride flush 0.9 % injection 5-40 mL  5-40 mL IntraVENous 2 times per day Gilda Crease, MD   10 mL at 09/22/21 0900   . sodium chloride flush 0.9 % injection 5-40 mL  5-40 mL IntraVENous PRN Gilda Crease, MD       . 0.9 % sodium chloride infusion   IntraVENous PRN Gilda Crease, MD       . ondansetron (ZOFRAN-ODT) disintegrating tablet 4 mg  4 mg Oral Q8H PRN Gilda Crease, MD        Or   . ondansetron [facility]) injection 4 mg  4 mg IntraVENous Q6H PRN Gilda Crease, MD       . polyethylene glycol [facility])  packet 17 g  17 g Oral Daily PRN Gilda Crease, MD       . glucose chewable tablet 16 g  4 tablet Oral PRN Gilda Crease, MD       . dextrose bolus 10% 125 mL  125 mL IntraVENous PRN Gilda Crease, MD        Or   . dextrose bolus 10% 250 mL  250 mL IntraVENous PRN Gilda Crease, MD       . glucagon injection 1 mg  1 mg SubCUTAneous PRN Gilda Crease, MD       . dextrose 10 % infusion   IntraVENous Continuous PRN Gilda Crease, MD       . HYDROmorphone HCl PF (DILAUDID) injection 0.25 mg  0.25 mg IntraVENous Q4H PRN Rulon Abide, MD   0.25 mg at 09/22/21 1149       Allergies:    Allergies   Allergen Reactions   . Aspirin Other (See Comments)     Had ulcers, was told not to take   . Erythromycin Other (See Comments)     Unknown reaction   . Amoxicillin Nausea And Vomiting   . Diflucan [Fluconazole] Rash       Problem List:    Patient Active Problem List   Diagnosis Code   . Essential hypertension I10   . Anxiety F41.9   . Adjustment disorder with mixed anxiety and depressed mood F43.23   . Altered mental status R41.82   . Acute encephalopathy G93.40   . Acute UTI (urinary tract infection) N39.0   . Subcapital fracture of femur, left, closed, initial encounter (HCC) S72.012A   . Closed fracture of left hip (HCC) S72.002A       Past Medical History:        Diagnosis Date   . Breast cancer (HCC)    . Hypertension        Past Surgical History:        Procedure Laterality Date    . BREAST LUMPECTOMY         Social History:    Social History     Tobacco Use   . Smoking status: Never   . Smokeless tobacco: Never   Substance Use Topics   . Alcohol use: Never                                Counseling given: Not Answered      Vital Signs (Current):   Vitals:    09/22/21 0647 09/22/21 0745 09/22/21 1000 09/22/21 1145   BP:  (!) 150/77 (!) 150/77 (!) 156/81   Pulse:  85  85   Resp: 16 16  16    Temp:  98.3 F (36.8 C)  98.4 F (36.9 C)   TempSrc:  Oral  Oral   SpO2:  92%  95%   Weight:       Height:                                                  BP Readings from Last 3 Encounters:   09/22/21 (!) 156/81   04/11/18 (!) 144/84   03/20/18 128/70       NPO Status:  BMI:   Wt Readings from Last 3 Encounters:   09/21/21 221 lb 3.2 oz (100.3 kg)   04/11/18 194 lb (88 kg)   03/20/18 194 lb (88 kg)     Body mass index is 31.74 kg/m.    CBC:   Lab Results   Component Value Date/Time    WBC 7.7 09/22/2021 07:20 AM    RBC 4.20 09/22/2021 07:20 AM    HGB 13.0 09/22/2021 07:20 AM    HCT 39.6 09/22/2021 07:20 AM    MCV 94.2 09/22/2021 07:20 AM    RDW 13.6 09/22/2021 07:20 AM    PLT 219 09/22/2021 07:20 AM       CMP:   Lab Results   Component Value Date/Time    NA 137 09/22/2021 07:20 AM    K 4.2 09/22/2021 07:20 AM    CL 99 09/22/2021 07:20 AM    CO2 28 09/22/2021 07:20 AM    BUN 14 09/22/2021 07:20 AM    CREATININE 0.8 09/22/2021 07:20 AM    GFRAA >60 02/10/2018 06:17 AM    AGRATIO 1.6 10/05/2017 02:25 PM    LABGLOM >60 09/22/2021 07:20 AM    GLUCOSE 114 09/22/2021 07:20 AM    PROT 7.1 10/05/2017 02:25 PM    CALCIUM 9.1 09/22/2021 07:20 AM    BILITOT 0.4 10/05/2017 02:25 PM    ALKPHOS 91 10/05/2017 02:25 PM    AST 43 10/05/2017 02:25 PM    ALT 22 10/05/2017 02:25 PM       POC Tests:   Recent Labs     09/22/21  1139   POCGLU 108*       Coags: No results found for: PROTIME, INR, APTT    HCG (If Applicable): No results found for:  PREGTESTUR, PREGSERUM, HCG, HCGQUANT     ABGs: No results found for: PHART, PO2ART, PCO2ART, HCO3ART, BEART, O2SATART     Type & Screen (If Applicable):  No results found for: LABABO, LABRH    Drug/Infectious Status (If Applicable):  No results found for: HIV, HEPCAB    COVID-19 Screening (If Applicable): No results found for: COVID19        Anesthesia Evaluation    Airway: Mallampati: II  TM distance: >3 FB   Neck ROM: full  Mouth opening: > = 3 FB   Dental: normal exam         Pulmonary: breath sounds clear to auscultation      (-) COPD, asthma, sleep apnea and not a current smoker                           Cardiovascular:    (+) hypertension:,     (-) past MI, CAD, CABG/stent, dysrhythmias,  angina,  CHF, orthopnea and PND      Rhythm: regular  Rate: normal           Beta Blocker:  Not on Beta Blocker         Neuro/Psych:   (+) depression/anxiety dementia   (-) seizures, TIA and CVA            ROS comment: Pt can state name and not too much else  Hx obtained from daughter GI/Hepatic/Renal:        (-) GERD, hepatitis, liver disease and no renal disease       Endo/Other:    (+) malignancy/cancer (s/p lumpectomy and estrogen suppression).    (-) diabetes mellitus, hypothyroidism, hyperthyroidism  Abdominal:             Vascular:     - DVT and PE.      Other Findings:           Anesthesia Plan      general     ASA 2     (Unwitnessed all at nursing care facility for GETA  R/B and DNR modifications gone over with POA, daughter at bedside)  Induction: intravenous.    MIPS: Postoperative opioids intended and Prophylactic antiemetics administered.  Anesthetic plan and risks discussed with healthcare power of attorney.      Plan discussed with CRNA.                    Gwenyth Bender, MD   09/22/2021

## 2021-09-22 NOTE — Progress Notes (Signed)
Delayed waiting for transport.

## 2021-09-22 NOTE — Progress Notes (Signed)
Patient is alert to self only. Currently off unit at surgery. Will return to room. Daughter has been at bedside. Pain has been managed with medication per MAR.

## 2021-09-22 NOTE — Progress Notes (Addendum)
TO PACU s/p LEFT HIP HEMIARTHROPLASTY ANTERIOR APPROACH - Left  General. Report from CRNA and OR nurse VSS pt bed wet with urine complete bed change and purewick external catheter applied. Pt crying in pain meds given. Xrays done.

## 2021-09-22 NOTE — Plan of Care (Signed)
Problem: Pain  Goal: Verbalizes/displays adequate comfort level or baseline comfort level  09/22/2021 2339 by Jerrilyn Cairo, RN  Outcome: Progressing  Note: Numeric pain rating scale use for assessment. PRN Dilaudid given as per [facility] & non- pharmacological measures applied too.     Problem: Safety - Adult  Goal: Free from fall injury  09/22/2021 2339 by Jerrilyn Cairo, RN  Outcome: Progressing  Note: All standard safety precautions in place, call light within reach. No fall injury.   Flowsheets (Taken 09/22/2021 2337)  Free From Fall Injury: Instruct family/caregiver on patient safety     Problem: Neurosensory - Adult  Goal: Achieves stable or improved neurological status  Outcome: Progressing  Note: PT monitored for any neuro changes.        Problem: ABCDS Injury Assessment  Goal: Absence of physical injury  Outcome: Progressing  Note; PT free from any physical injury.   Flowsheets (Taken 09/22/2021 2337)  Absence of Physical Injury: Implement safety measures based on patient assessment     Problem: Skin/Tissue Integrity  Goal: Absence of new skin breakdown  Description: 1.  Monitor for areas of redness and/or skin breakdown  2.  Assess vascular access sites hourly  3.  Every 4-6 hours minimum:  Change oxygen saturation probe site  4.  Every 4-6 hours:  If on nasal continuous positive airway pressure, respiratory therapy assess nares and determine need for appliance change or resting period.  Outcome: Progressing

## 2021-09-22 NOTE — Anesthesia Procedure Notes (Signed)
Peripheral Block    Patient location during procedure: pre-op  Reason for block: post-op pain management and at surgeon's request  Start time: 09/22/2021 2:20 PM  End time: 09/22/2021 2:25 PM  Staffing  Performed: anesthesiologist   Anesthesiologist: Anastasia Pall, MD  Preanesthetic Checklist  Completed: patient identified, IV checked, site marked, risks and benefits discussed, surgical/procedural consents, equipment checked, pre-op evaluation, timeout performed, anesthesia consent given, oxygen available, monitors applied/VS acknowledged, fire risk safety assessment completed and verbalized and blood product R/B/A discussed and consented  Peripheral Block   Patient position: supine  Prep: ChloraPrep  Provider prep: mask and sterile gloves  Patient monitoring: cardiac monitor, continuous pulse ox, continuous capnometry, frequent blood pressure checks, IV access, oxygen and responsive to questions  Block type: PENG  Laterality: left  Injection technique: single-shot  Guidance: ultrasound guided    Needle   Needle type: insulated echogenic nerve stimulator needle   Needle gauge: 22 G  Needle localization: ultrasound guidance  Needle length: 8 cm  Assessment   Injection assessment: no paresthesia on injection, negative aspiration for heme, local visualized surrounding nerve on ultrasound, no intravascular symptoms and low pressure verified by pressure monitor  Paresthesia pain: none  Slow fractionated injection: yes  Hemodynamics: stable  Real-time [place] image taken/store: yes  Outcomes: uncomplicated and patient tolerated procedure well    Additional Notes  0.5% ropivacaine 30 ml, injected in 5 ml increments with negative aspiration between.  Medications Administered  ropivacaine (NAROPIN) injection 0.5% - Perineural   30 mL - 09/22/2021 2:20:00 PM

## 2021-09-22 NOTE — Brief Op Note (Signed)
Brief Postoperative Note      Patient: Leslie Bradley  Date of Birth: Mar 17, 1928  MRN: [id number]    Date of Procedure: 10-20-21    Pre-Op Diagnosis Codes:     * Closed fracture of left hip, initial encounter (HCC) [S72.002A]    Post-Op Diagnosis: Same       Procedure(s):  LEFT HIP HEMIARTHROPLASTY ANTERIOR APPROACH    Surgeon(s):  Nunzio Cobbs, MD    Assistant:  Surgical Assistant: Daivd Council    Anesthesia: General    Estimated Blood Loss (mL): 200     Complications: None    Specimens:   * No specimens in log *    Implants:  * No implants in log *      Drains: * No LDAs found *    Findings: complete displaced left femoral neck fracture      Electronically signed by Nunzio Cobbs, MD on 2021-10-20 at 4:05 PM

## 2021-09-23 LAB — CBC WITH AUTO DIFFERENTIAL
Basophils %: 0.1 %
Basophils Absolute: 0 10*3/uL (ref 0.0–0.2)
Eosinophils %: 0.1 %
Eosinophils Absolute: 0 10*3/uL (ref 0.0–0.6)
Hematocrit: 31.7 % — ABNORMAL LOW (ref 36.0–48.0)
Hemoglobin: 10.6 g/dL — ABNORMAL LOW (ref 12.0–16.0)
Lymphocytes %: 11.6 %
Lymphocytes Absolute: 1.1 10*3/uL (ref 1.0–5.1)
MCH: 31.7 pg (ref 26.0–34.0)
MCHC: 33.4 g/dL (ref 31.0–36.0)
MCV: 94.8 fL (ref 80.0–100.0)
MPV: 9 fL (ref 5.0–10.5)
Monocytes %: 12 %
Monocytes Absolute: 1.1 10*3/uL (ref 0.0–1.3)
Neutrophils %: 76.2 %
Neutrophils Absolute: 7.2 10*3/uL (ref 1.7–7.7)
Platelets: 204 10*3/uL (ref 135–450)
RBC: 3.34 M/uL — ABNORMAL LOW (ref 4.00–5.20)
RDW: 13.9 % (ref 12.4–15.4)
WBC: 9.5 10*3/uL (ref 4.0–11.0)

## 2021-09-23 LAB — POCT GLUCOSE
POC Glucose: 113 mg/dl — ABNORMAL HIGH (ref 70–99)
POC Glucose: 107 mg/dl — ABNORMAL HIGH (ref 70–99)
POC Glucose: 116 mg/dl — ABNORMAL HIGH (ref 70–99)

## 2021-09-23 LAB — BASIC METABOLIC PANEL W/ REFLEX TO MG FOR LOW K
Anion Gap: 8 (ref 3–16)
BUN: 18 mg/dL (ref 7–20)
CO2: 27 mmol/L (ref 21–32)
Calcium: 8.3 mg/dL (ref 8.3–10.6)
Chloride: 104 mmol/L (ref 99–110)
Creatinine: 1.1 mg/dL (ref 0.6–1.2)
Est, Glom Filt Rate: 47 — AB (ref >60)
Glucose: 108 mg/dL — ABNORMAL HIGH (ref 70–99)
Potassium reflex Magnesium: 4.3 mmol/L (ref 3.5–5.1)
Sodium: 139 mmol/L (ref 136–145)

## 2021-09-23 LAB — MAGNESIUM: Magnesium: 2 mg/dL (ref 1.80–2.40)

## 2021-09-23 MED ORDER — SENNOSIDES 8.6 MG PO TABS
8.6 MG | Freq: Every day | ORAL | Status: AC
Start: 2021-09-23 — End: 2021-09-28
  Administered 2021-09-23 – 2021-09-28 (×6): 8.6 via ORAL

## 2021-09-23 MED ORDER — POLYETHYLENE GLYCOL 3350 17 G PO PACK
17 g | Freq: Every day | ORAL | Status: AC
Start: 2021-09-23 — End: 2021-09-28
  Administered 2021-09-23 – 2021-09-28 (×5): 17 g via ORAL

## 2021-09-23 MED ORDER — LACTATED RINGERS IV SOLN
INTRAVENOUS | Status: AC
Start: 2021-09-23 — End: 2021-09-24
  Administered 2021-09-23 – 2021-09-24 (×2): via INTRAVENOUS

## 2021-09-23 MED ORDER — HYDROMORPHONE HCL PF 1 MG/ML IJ SOLN
1 MG/ML | INTRAMUSCULAR | Status: AC | PRN
Start: 2021-09-23 — End: 2021-09-28
  Administered 2021-09-24: 0.25 mg via INTRAVENOUS

## 2021-09-23 MED FILL — ASPIRIN LOW DOSE 81 MG PO CHEW: 81 MG | ORAL | Qty: 1

## 2021-09-23 MED FILL — HYDROMORPHONE HCL 1 MG/ML IJ SOLN: 1 MG/ML | INTRAMUSCULAR | Qty: 1

## 2021-09-23 MED FILL — CEFAZOLIN SODIUM 2 G IJ SOLR: 2 g | INTRAMUSCULAR | Qty: 2000

## 2021-09-23 MED FILL — METHOCARBAMOL 500 MG PO TABS: 500 MG | ORAL | Qty: 1

## 2021-09-23 MED FILL — VENLAFAXINE HCL 25 MG PO TABS: 25 MG | ORAL | Qty: 1

## 2021-09-23 MED FILL — MIRTAZAPINE 15 MG PO TABS: 15 MG | ORAL | Qty: 1

## 2021-09-23 MED FILL — ACETAMINOPHEN EXTRA STRENGTH 500 MG PO TABS: 500 MG | ORAL | Qty: 2

## 2021-09-23 MED FILL — SENNA-LAX 8.6 MG PO TABS: 8.6 MG | ORAL | Qty: 1

## 2021-09-23 MED FILL — OXYCODONE HCL 5 MG PO TABS: 5 MG | ORAL | Qty: 2

## 2021-09-23 MED FILL — AMLODIPINE BESYLATE 5 MG PO TABS: 5 MG | ORAL | Qty: 1

## 2021-09-23 MED FILL — FUROSEMIDE 20 MG PO TABS: 20 MG | ORAL | Qty: 1

## 2021-09-23 MED FILL — OXYCODONE HCL 5 MG PO TABS: 5 MG | ORAL | Qty: 1

## 2021-09-23 MED FILL — FAMOTIDINE 20 MG PO TABS: 20 MG | ORAL | Qty: 1

## 2021-09-23 MED FILL — POLYETHYLENE GLYCOL 3350 17 G PO PACK: 17 g | ORAL | Qty: 1

## 2021-09-23 MED FILL — ONDANSETRON HCL 4 MG/2ML IJ SOLN: 4 MG/2ML | INTRAMUSCULAR | Qty: 2

## 2021-09-23 NOTE — Plan of Care (Signed)
Pt pulled iv out and pulled dressing off , left leg dressing , incision well approximated , dressing reapplied , positioned for comfort ,

## 2021-09-23 NOTE — Progress Notes (Signed)
Pt alert and oriented to self only but disoriented to place, time and situation so re- orientation has been given. Pain management done with MAR& non- pharmacological measures too. VS monitored and documented. O2 inhalation continue at 4L/m. Is on regular diet. Pt voiding with external catheter and no  bowel movement during this shift. All standard safety precautions in place, call light within reach, no fall injury. Will continue to monitor.

## 2021-09-23 NOTE — Progress Notes (Signed)
Progress Note    Admit Date: 09/21/2021  Day: 3  Diet: ADULT DIET; Regular    CC: Fall and hip pain    Interval history:   Pt seen and examined at bedside. Had left hip hemiarthroplasty yesterday without complications. EBL 200 mL with Hgb 10.6 down from 13.0 today. Pt with no acute events overnight, denies significant chest pain or SOB. Expressing abdominal pain on palpation. Improved vitals and remains hemodynamically stable with reduced supplemental O2 requirement. Hip pain is well-controlled.     HPI:     Leslie Bradley is a 88 y.o. female with pmhx dementia and HTN who presents to the emergency department from assisted living with left hip pain after fall. Patient unable to provide history d/t dementia, facility reports they are concerned she tripped and fell, the fall was not witnessed. Staff alerted by patient screaming and found patient on the ground holding onto someone's legs. Patient not on any anticoagulation.      On arrival to ED patient afebrile, hypertensive, regular rate satting well on RA. On initial evaluation by ED, patient complaining of L hip pain, L leg shorter compared right. XR showed impacted L femoral neck fracture with superior migration. Seen by Ortho, patient has displaced left hip subcapital femur fracture, plan to operate tomorrow if patient medically stable. Per daughter patient is at baseline mental status. Patient admitted for further management.    Medications:     Scheduled Meds:   polyethylene glycol  17 g Oral Daily    senna  1 tablet Oral Daily    aspirin  81 mg Oral BID    methocarbamol  500 mg Oral TID    acetaminophen  1,000 mg Oral 3 times per day    famotidine  20 mg Oral Daily    mirtazapine  15 mg Oral Nightly    amLODIPine  5 mg Oral Daily    furosemide  20 mg Oral Daily    venlafaxine  100 mg Oral BID WC    sodium chloride flush  5-40 mL IntraVENous 2 times per day     Continuous Infusions:   sodium chloride      dextrose       PRN Meds:oxyCODONE **OR** oxyCODONE,  melatonin, sodium chloride flush, sodium chloride, ondansetron **OR** ondansetron, glucose, dextrose bolus **OR** dextrose bolus, glucagon (rDNA), dextrose, HYDROmorphone    Objective:   Vitals:   T-max:  Patient Vitals for the past 8 hrs:   BP Temp Temp src Pulse Resp SpO2   09/23/21 0915 (!) 144/79 (!) 101.3 F (38.5 C) Oral 94 18 --   09/23/21 0659 128/67 98 F (36.7 C) Temporal 100 16 95 %   09/23/21 0529 -- -- -- -- 16 --   09/23/21 0459 -- -- -- -- 16 --   09/23/21 0315 126/83 97.1 F (36.2 C) Temporal 90 16 92 %       Intake/Output Summary (Last 24 hours) at 09/23/2021 1015  Last data filed at 09/23/2021 0600  Gross per 24 hour   Intake 100 ml   Output 950 ml   Net -850 ml     Review of systems  A 10 point ROS was conducted with pertinent positives and negatives listed as above    Physical Exam  Constitutional:       General: She is not in acute distress.     Appearance: She is obese.   HENT:      Head: Normocephalic.      Mouth/Throat:  Mouth: Mucous membranes are moist.   Cardiovascular:      Rate and Rhythm: Normal rate and regular rhythm.      Pulses: Normal pulses.   Pulmonary:      Effort: Pulmonary effort is normal.      Breath sounds: Normal breath sounds.   Abdominal:      General: Bowel sounds are normal.      Palpations: Abdomen is soft.   Musculoskeletal:      Cervical back: Normal range of motion.      Comments: L hip in wraps  Skin:     General: Skin is warm and dry.   Neurological:      Mental Status: She is disoriented. Waxing and waning mental status at baseline     Comments: Oriented to self only (with maiden name [name] not married name), per daughter this is her baseline         LABS:    CBC:   Recent Labs     09/21/21  1414 09/22/21  0720 09/23/21  0742   WBC 8.3 7.7 9.5   HGB 12.2 13.0 10.6*   HCT 36.3 39.6 31.7*   PLT 270 219 204   MCV 93.2 94.2 94.8     Renal:    Recent Labs     09/21/21  1414 09/21/21  1811 09/22/21  0720 09/23/21  0742   NA 138  --  137 139   K 6.1* 3.8 4.2 4.3    CL 100  --  99 104   CO2 29  --  28 27   BUN 14  --  14 18   CREATININE 1.0  --  0.8 1.1   GLUCOSE 126*  --  114* 108*   CALCIUM 8.6  --  9.1 8.3   MG  --   --  2.10 2.00   ANIONGAP 9  --  10 8     Hepatic: No results for input(s): AST, ALT, BILITOT, BILIDIR, PROT, LABALBU, ALKPHOS in the last 72 hours.  Troponin: No results for input(s): TROPONINI in the last 72 hours.  BNP: No results for input(s): BNP in the last 72 hours.  Lipids: No results for input(s): CHOL, HDL in the last 72 hours.    Invalid input(s): LDLCALCU, TRIGLYCERIDE  ABGs:  No results for input(s): PHART, PCO2ART, PO2ART, HCO3ART, BEART, THGBART, O2SATART, TCO2ART in the last 72 hours.    INR: No results for input(s): INR in the last 72 hours.  Lactate: No results for input(s): LACTATE in the last 72 hours.  Cultures:  -----------------------------------------------------------------  RAD:   XR PELVIS (1-2 VIEWS)   Final Result      Postsurgical changes of left total hip arthroplasty without evidence of   immediate complication.      XR HIP 2-3 VW W PELVIS LEFT   Final Result   Operative fluoroscopy, as above.      FLUORO FOR SURGICAL PROCEDURES   Final Result   Operative fluoroscopy, as above.      XR CHEST PORTABLE   Final Result      Femoral neck acute transverse oblique fracture, likely subcapital in position      Mid to distal femur remains intact including the knee joint.      SINGLE AP VIEW OF THE PELVIS       HISTORY: Left hip and pelvic pain.      PROCEDURE: Single AP supine view of the pelvis was obtained  FINDINGS:      Impacted left femoral neck fracture is noted with superior migration of the   greater trochanter. Right femoral head and neck and proximal hip appear intact.      The remainder the pelvis is intact. There are no unusual calcifications.      IMPRESSION:      Impacted left femoral neck fracture with superior migration, likely subcapital   in position, transverse or oblique.      CHEST PORTABLE VIEW      HISTORY: Larey Seat,  hip fracture, preoperative   COMPARISON: February 08, 2018      PROCEDURE: AP portable semierect upright view of the chest was obtained at 1435   hours      FINDINGS:       LINES: No central lines or tube      LUNGS: Lungs appear grossly clear      HEART: Heart size borderline. Atherosclerotic change of aorta      There is no evidence of pneumothorax      IMPRESSION:      1. No acute process or consolidation   2. Atherosclerotic aorta.               XR FEMUR LEFT (MIN 2 VIEWS)   Final Result      Femoral neck acute transverse oblique fracture, likely subcapital in position      Mid to distal femur remains intact including the knee joint.      SINGLE AP VIEW OF THE PELVIS       HISTORY: Left hip and pelvic pain.      PROCEDURE: Single AP supine view of the pelvis was obtained      FINDINGS:      Impacted left femoral neck fracture is noted with superior migration of the   greater trochanter. Right femoral head and neck and proximal hip appear intact.      The remainder the pelvis is intact. There are no unusual calcifications.      IMPRESSION:      Impacted left femoral neck fracture with superior migration, likely subcapital   in position, transverse or oblique.      CHEST PORTABLE VIEW      HISTORY: Larey Seat, hip fracture, preoperative   COMPARISON: February 08, 2018      PROCEDURE: AP portable semierect upright view of the chest was obtained at 1435   hours      FINDINGS:       LINES: No central lines or tube      LUNGS: Lungs appear grossly clear      HEART: Heart size borderline. Atherosclerotic change of aorta      There is no evidence of pneumothorax      IMPRESSION:      1. No acute process or consolidation   2. Atherosclerotic aorta.               XR PELVIS (1-2 VIEWS)   Final Result      Femoral neck acute transverse oblique fracture, likely subcapital in position      Mid to distal femur remains intact including the knee joint.      SINGLE AP VIEW OF THE PELVIS       HISTORY: Left hip and pelvic pain.       PROCEDURE: Single AP supine view of the pelvis was obtained      FINDINGS:      Impacted left femoral neck fracture is noted with superior migration  of the   greater trochanter. Right femoral head and neck and proximal hip appear intact.      The remainder the pelvis is intact. There are no unusual calcifications.      IMPRESSION:      Impacted left femoral neck fracture with superior migration, likely subcapital   in position, transverse or oblique.      CHEST PORTABLE VIEW      HISTORY: Larey Seat, hip fracture, preoperative   COMPARISON: February 08, 2018      PROCEDURE: AP portable semierect upright view of the chest was obtained at 1435   hours      FINDINGS:       LINES: No central lines or tube      LUNGS: Lungs appear grossly clear      HEART: Heart size borderline. Atherosclerotic change of aorta      There is no evidence of pneumothorax      IMPRESSION:      1. No acute process or consolidation   2. Atherosclerotic aorta.                   Assessment/Plan:     Displaced L Hip Subcapital Femur Fracture   Seen by ortho in ED, had left hip hemiarthroplasty on 8/24 with Dr. Ricki Miller. Tolerated well  - Orthopedic surgery consulted; appreciate recs   - left hip hemiarthroplasty on 8/24   - PT/OT consulted   - Weight bearing as tolerated   - ASA 81 mg BID for 4 weeks   - F/u in 4 weeks   - Pain regimen:   - Oxycodone 5 mg q4h prn   - Dilaudid 0.25 mg IV q4h prn for breakthrough pain   - Robaxin 500 mg TID     Limited urine output  <100 mL urine output today. Bladder scan showed almost no urine in the bladder. Suspect reduced urinary output primarily due to low volume status compiled with physiologic ADH secretion from stress   - Continue LR 100 mL/hr LR continuous    Chronic Medical Conditions     HTN   - Continue home amlodipine 5 mg   - Continue home lasix 20 mg      Anxiety   - Continue home venlafaxine 100 mg BID     Code Status: DNR-CCA  FEN: Adult diet; regular  PPX: pepcid, ASA 81 BID  DISPO: GGMF    Girard Cooter, MD, PGY-1  09/23/21  10:15 AM    This patient has been staffed and discussed with Virgilio Belling, MD.

## 2021-09-23 NOTE — Progress Notes (Signed)
Physical Therapy  Facility/Department: [facility]  Physical Therapy Initial Assessment/Treatment    Name: Leslie Bradley  DOB: Apr 06, 1928  MRN: [id number]  Date of Service: 09/23/2021    Discharge Recommendations:    Leslie Bradley scored a 6/24 on the AM-PAC short mobility form. Current research shows that an AM-PAC score of 17 or less is typically not associated with a discharge to the patient's home setting. Based on the patient's AM-PAC score and their current functional mobility deficits, it is recommended that the patient have 3-5 sessions per week of Physical Therapy at d/c to increase the patient's independence.  Please see assessment section for further patient specific details.    If patient discharges prior to next session this note will serve as a discharge summary.  Please see below for the latest assessment towards goals.     PT Equipment Recommendations  Equipment Needed: No (defer)      Patient Diagnosis(es): The encounter diagnosis was Closed fracture of left hip, initial encounter (HCC).  Past Medical History:  has a past medical history of Breast cancer (HCC) and Hypertension.  Past Surgical History:  has a past surgical history that includes Breast lumpectomy and hip surgery (Left, 09/22/2021).    Assessment   Body Structures, Functions, Activity Limitations Requiring Skilled Therapeutic Intervention: Decreased functional mobility ;Decreased cognition;Decreased safe awareness;Decreased strength;Decreased balance;Increased pain;Decreased posture  Assessment: 88 y/o pt s/p LEFT HIP HEMIARTHROPLASTY ANTERIOR APPROACH. Pt currently requiring Ax2 for bed mobility. Pt heavily limited this session by impaired cognition and poor command following ability. Pt unsafe to attempt OOB mobility this date. Pt is well below her baseline and would benefit from further skilled PT to maximize safety and independence with functional mobility. Will continue to follow.  Treatment Diagnosis: Decreased  functional mobility  Therapy Prognosis: Good;Fair  Decision Making: Medium Complexity  Barriers to Learning: cognition  Requires PT Follow-Up: Yes  Activity Tolerance  Activity Tolerance: Patient tolerated evaluation without incident;Treatment limited secondary to agitation;Treatment limited secondary to decreased cognition     Plan   Physcial Therapy Plan  General Plan: 5-7 times per week  Current Treatment Recommendations: Strengthening, Balance training, Functional mobility training, Transfer training, Gait training, Endurance training, Safety education & training, Patient/Caregiver education & training, Therapeutic activities  Safety Devices  Type of Devices: Call light within reach, Nurse notified, Left in bed, Bed alarm in place (ice to L hip, daughter in room)     Restrictions  Position Activity Restriction  Other position/activity restrictions: WBAT, ambulate, ant hip precautions     Subjective   General  Chart Reviewed: Yes  Additional Pertinent Hx: 88 y/o pt s/p LEFT HIP HEMIARTHROPLASTY ANTERIOR APPROACH.  Family / Caregiver Present: No  Referring Practitioner: Ricki Miller, MD  Diagnosis: s/p LEFT HIP HEMIARTHROPLASTY ANTERIOR APPROACH  Follows Commands: Within Functional Limits  Subjective  Subjective: Pt found supine in bed upon arrival, denying pain and agreeable to therapy.         Social/Functional History  Social/Functional History  Type of Home: Facility Psychologist, occupational Care)  ADL Assistance: Needs assistance (set up, cues, min assist)  Ambulation Assistance: Independent    Cognition   Orientation  Overall Orientation Status: Impaired  Orientation Level: Oriented to person;Disoriented to place;Disoriented to time;Disoriented to situation  Cognition  Overall Cognitive Status: Exceptions  Cognition Comment: awake, occasionally attentive, confused, word salad, follows commands <25% of the time, able to answer simple questions occasionally, decreased insight, safety awareness, problem solving      Objective  Pulse: 94  Heart Rate Source: Monitor  BP: (!) 141/79  BP Location: Right upper arm  BP Method: Automatic  Patient Position: Semi fowlers  MAP (Calculated): 100  Respirations: 18  SpO2: 95 %  O2 Device: Nasal cannula              AROM RLE (degrees)  RLE General AROM: unable to assess  AROM LLE (degrees)  LLE General AROM: unable to assess  Strength RLE  Comment: unable to assess  Strength LLE  Comment: unable to assess        Bed Mobility Training  Bed Mobility Training: Yes  Rolling: Assist X2;Total assistance  Supine to Sit: Maximum assistance;Assist X2  Sit to Supine: Total assistance;Assist X2  Scooting: Maximum assistance;Assist X2  Balance  Sitting: With support (initially max A progressing to SBA/CGA; EOB 30 min)  Standing:  (Not attempted due to many factors; will progress)  Transfer Training  Transfer Training: No  Bed mobility  Rolling to Left: 2 Person assistance;Maximum assistance  Supine to Sit: 2 Person assistance;Maximum assistance  Sit to Supine: 2 Person assistance;Dependent/Total  Scooting: Dependent/Total;2 Person assistance           Balance  Posture: Fair  Sitting - Static: Fair;Poor  Sitting - Dynamic: Fair;Poor  Comments: Pt sat EOB ~53min max A initially progressing to CGA.         Goals  Short Term Goals  Time Frame for Short Term Goals: d/c  Short Term Goal 1: sup<>sit mod Ax1  Short Term Goal 2: sit<>stand mod Ax2  Short Term Goal 3: bed <> chair mod Ax2 with RW  Patient Goals   Patient Goals : none stated       Education  Patient Education  Education Given To: Patient  Education Provided: Role of Therapy  Education Method: Demonstration  Barriers to Learning: None  Education Outcome: Verbalized understanding      Therapy Time   Individual Concurrent Group Co-treatment   Time In 0915         Time Out 1015         Minutes 60         Timed Code Treatment Minutes:  45    Total Treatment Minutes:  60     If the patient is discharged before the next treatment session, this note  will serve as the discharge summary.     Domingo Mend, PT, DPT

## 2021-09-23 NOTE — Progress Notes (Signed)
4 Eyes Skin Assessment     NAME:  Leslie Bradley  DATE OF BIRTH:  04/21/1928  MEDICAL RECORD NUMBER:  [id number]    The patient is being assessed for  Post-Op Surgical    I agree that at least one RN has performed a thorough Head to Toe Skin Assessment on the patient. ALL assessment sites listed below have been assessed.      Areas assessed by both nurses:    Head, Face, Ears, Shoulders, Back, Chest, Arms, Elbows, Hands, Sacrum. Buttock, Coccyx, Ischium, Legs. Feet and Heels, and Under Medical Devices   Redness to groin and bottom, blanchable redness to bilateral great toe.      Does the Patient have a Wound? No noted wound(s)       Braden Prevention initiated by RN: No  Wound Care Orders initiated by RN: No    Pressure Injury (Stage 3,4, Unstageable, DTI, NWPT, and Complex wounds) if present, place Wound referral order by RN under ORDER ENTRY: No    New Ostomies, if present place, Ostomy referral order under ORDER ENTRY: No     Nurse 1 eSignature: Electronically signed by Chilton Greathouse, RN on 09/23/21 at 2:28 AM EDT    **SHARE this note so that the co-signing nurse can place an eSignature**    Nurse 2 eSignature: Electronically signed by Jerrilyn Cairo, RN on 09/23/21 at 5:39 AM EDT

## 2021-09-23 NOTE — Plan of Care (Signed)
Problem: ABCDS Injury Assessment  Goal: Absence of physical injury  Outcome: Progressing  Note: Pt free from physical injury.   Flowsheets (Taken 09/23/2021 2214)  Absence of Physical Injury: Implement safety measures based on patient assessment     Problem: Skin/Tissue Integrity  Goal: Absence of new skin breakdown  Description: 1.  Monitor for areas of redness and/or skin breakdown  2.  Assess vascular access sites hourly  3.  Every 4-6 hours minimum:  Change oxygen saturation probe site  4.  Every 4-6 hours:  If on nasal continuous positive airway pressure, respiratory therapy assess nares and determine need for appliance change or resting period.  Outcome: Progressing     Problem: Discharge Planning  Goal: Discharge to home or other facility with appropriate resources  Outcome: Progressing  Note: CM is following for discharge.   Flowsheets (Taken 09/23/2021 1900)  Discharge to home or other facility with appropriate resources:   Identify barriers to discharge with patient and caregiver   Arrange for needed discharge resources and transportation as appropriate   Identify discharge learning needs (meds, wound care, etc)     Problem: Pain  Goal: Verbalizes/displays adequate comfort level or baseline comfort level  09/23/2021 2217 by Jerrilyn Cairo, RN  Outcome: Progressing  Note: Numeric pain rating scale used. PRN medication given as per MAR& non- pharmacological measures applied too.      Problem: Safety - Adult  Goal: Free from fall injury  Outcome: Progressing  Note: All standard safety precautions in place, call light within reach. Pt free from fall injury.   Flowsheets (Taken 09/23/2021 2214)  Free From Fall Injury: Instruct family/caregiver on patient safety     Problem: Neurosensory - Adult  Goal: Achieves stable or improved neurological status  Outcome: Progressing  Flowsheets (Taken 09/23/2021 1900)  Achieves stable or improved neurological status: Assess for and report changes in neurological status      Problem: Neurosensory - Adult  Goal: Achieves maximal functionality and self care  Outcome: Progressing  Flowsheets (Taken 09/23/2021 1900)  Achieves maximal functionality and self care: Monitor swallowing and airway patency with patient fatigue and changes in neurological status

## 2021-09-23 NOTE — Progress Notes (Signed)
Occupational Therapy  Facility/Department: [facility]  Occupational Therapy Initial Assessment/Tx Note    Name: Leslie Bradley  DOB: 14-Jun-1928  MRN: [id number]  Date of Service: 09/23/2021    Assessment: Pt with dementia from [facility], but independent with mobility and able to complete most ADLs with set up and cues. Now following surgical repair of hip fx, cognition is significantly impaired. Pt febrile during eval, very confused, having difficulty participating. She tolerated sitting EOB for 30+ minutes but was not ready to attempt transfer out of bed. Will continue to progress mobility and ADLs as able. Recommend continued inpt OT/PT at d/c.    Discharge Recommendations: Camesha Farooq scored a 7/24 on the AM-PAC ADL Inpatient form. Current research shows that an AM-PAC score of 17 or less is typically not associated with a discharge to the patient's home setting. Based on the patient's AM-PAC score and their current ADL deficits, it is recommended that the patient have 3-5 sessions per week of Occupational Therapy at d/c to increase the patient's independence.  Please see assessment section for further patient specific details.    If patient discharges prior to next session this note will serve as a discharge summary.  Please see below for the latest assessment towards goals.        OT Equipment Recommendations  Equipment Needed: No     Treatment Diagnosis: Decreased activity tolerance, impaired ADLs and mobility      Assessment   Performance deficits / Impairments: Decreased functional mobility ;Decreased ADL status;Decreased safe awareness;Decreased cognition;Decreased endurance;Decreased balance  Treatment Diagnosis: Decreased activity tolerance, impaired ADLs and mobility  Decision Making: Medium Complexity  REQUIRES OT FOLLOW-UP: Yes  Activity Tolerance  Activity Tolerance: Treatment limited secondary to decreased cognition;Patient limited by pain  Activity Tolerance Comments:  Throughout most of session, worked with RN on getting pt to take Tylenol for fever and pain. Pt swallowed one bite of applesauce with crushed tylenol but had difficulty swallowing water. After much effort, daughter able to get pt to finish tylenol alternating with ice cream. Pt c/o dizziness in sitting which eventually seemed to improve. BP prior to session 144/79. BP after about 15 min sitting 115/50s (back up to 130s in bed). Pt on 2L O2, which she removed intermittently and would not allow to be replaced. spO2 after several minutes off oxygen 84%. 2L O2 replaced with effort. Pt was never aggressive, but has difficulty participating and is resistant due to severe confusion.        Plan   Occupational Therapy Plan  Times Per Week: 5-7x  Times Per Day: Once a day  Current Treatment Recommendations: Strengthening, Balance training, Functional mobility training, Endurance training, Safety education & training, Patient/Caregiver education & training, Gait training, Cognitive reorientation, Equipment evaluation, education, & procurement, Pain management, Self-Care / ADL     Restrictions  Position Activity Restriction  Other position/activity restrictions: WBAT, ambulate, ant hip precautions    Subjective   General  Chart Reviewed: Yes  Additional Pertinent Hx: 88 y.o. F admitted 8/23 after fall with subcapital L femur fx s/p hemiarthroplasty 8/24. PMHx includes dementia, breast CA  Family / Caregiver Present: Yes (daughter present for about half of session)  Referring Practitioner: Irish Lack  Subjective  Subjective: Pt in bed on entry. Very confused. Word salad. Having difficulty cooperating due to confusion.  General Comment  Comments: Pt indicates pain to L hip, R hip, IV site, RN aware; ice pack applied to L hip after session.  Social/Functional History  Social/Functional History  Type of Home: Facility ([facility])  ADL Assistance: Needs assistance (set up, cues, min assist)  Ambulation Assistance:  Independent       Objective            Safety Devices  Type of Devices: Call light within reach;Nurse notified;Left in bed;Bed alarm in place (ice to L hip, daughter in room)  Bed Mobility Training  Bed Mobility Training: Yes  Rolling: Assist X2;Total assistance  Supine to Sit: Maximum assistance;Assist X2  Sit to Supine: Total assistance;Assist X2  Scooting: Maximum assistance;Assist X2  Balance  Sitting: With support (initially max A progressing to SBA/CGA; EOB 30 min)  Standing:  (Not attempted due to many factors; will progress)  Transfer Training  Transfer Training: No     Strength: Within functional limits  ADL  Grooming: Maximum assistance;Setup;Verbal cueing  Grooming Skilled Clinical Factors: wash face  UE Dressing: Dependent/Total;Verbal cueing  UE Dressing Skilled Clinical Factors: to change gown  Toileting: Dependent/Total  Toileting Skilled Clinical Factors: purwick       Cognition  Overall Cognitive Status: Exceptions  Cognition Comment: awake, occasionally attentive, confused, word salad, follows commands <25% of the time, able to answer simple questions occasionally, decreased insight, safety awareness, problem solving  Orientation  Overall Orientation Status: Impaired  Orientation Level: Oriented to person;Disoriented to place;Disoriented to time;Disoriented to situation          Education Given To: Patient;Family  Education Provided: Role of Therapy;Plan of Care;Precautions;ADL Adaptive Chief of Staff;Fall Prevention Strategies;Orientation;Family Education  Education Method: Verbal  Barriers to Learning: Cognition  Education Outcome: Unable to demonstrate understanding;Continued education needed (daughter verb understanding)            AM-PAC Score        AM-PAC Inpatient Daily Activity Raw Score: 7 (09/23/21 1021)  AM-PAC Inpatient ADL T-Scale Score : 20.13 (09/23/21 1021)  ADL Inpatient CMS 0-100% Score: 92.44 (09/23/21 1021)  ADL Inpatient CMS G-Code Modifier : CM (09/23/21  1021)      Goals  Short Term Goals  Time Frame for Short Term Goals: by D/C  Short Term Goal 1: wash face and brush teeth spvn, set up in sitting - Not met  Short Term Goal 2: Participate in sit to stand to progress mobility - Not met  Short Term Goal 3: Self-feeding mod I, set up - Not met       Therapy Time   Individual Concurrent Group Co-treatment   Time In 0910         Time Out 1012         Minutes 62          Timed Code Tx Min: 49  Total Tx Time: 62       Latrell Potempa A Saima Monterroso, OT

## 2021-09-23 NOTE — Plan of Care (Signed)
Problem: Pain  Goal: Verbalizes/displays adequate comfort level or baseline comfort level  Note: Left hip dressing , CDI , toes warm pink , brisk cap refill , positive dorsi flex , up to side of bed , , using is with assist up to 2250 , pulse ox 94 to 93 on 2 liters , void amber urine per pur wic , bladder scan 0 , pills in ice screen able to tolerate whole , family at bed side

## 2021-09-23 NOTE — Care Coordination-Inpatient (Signed)
CM following: CM met with pt and pt's family including legal guardian/daughter, Santiago Glad, at bedside. Santiago Glad requested referrals to the following SNF's:  [facility]  [facility]  [facility] has sent electronic referrals and will f/u by phone this PM as time allows. CM will continue to follow.  Electronically signed by Buckner Malta, MSW on 09/23/2021 at 1:57 PM  [phone]

## 2021-09-23 NOTE — Progress Notes (Signed)
09/23/21 1256   Encounter Summary   Encounter Overview/Reason  Initial Encounter   Service Provided For: Patient and family together   Referral/Consult From: Rounding   Support System Family members   Last Encounter  09/23/21  (dje)   Complexity of Encounter Moderate   Begin Time 1235   End Time  1250   Total Time Calculated 15 min   Assessment/Intervention/Outcome   Assessment Calm;Hopeful   Intervention Active listening;Explored/Affirmed feelings, thoughts, concerns;Nurtured Hope   Outcome Comfort;Expressed feelings, needs, and concerns;Expressed Gratitude     PT's family at bedside. No particular needs at this time.   Staff Chaplain, Dyane Dustman, MA, [facility]

## 2021-09-23 NOTE — Progress Notes (Signed)
Orthopaedic Surgery Progress Note:    Leslie Bradley is a 88 y.o. female now POD # 1 s/p L hip hemiarthroplasty for femoral neck fracture.    S:   Pt seen and examined  Pain: seems to not be well controlled, getting scheduled tylenol and robaxin, has not received oral oxycodone  Tolerating PO: yes  Nausea/emesis: none  Voiding: spontaneously  Flatus/BM: no  Ambulation/therapy: no, was not able to stand w/ therapy      O:   Vitals:    09/23/21 1522   BP: (!) 149/71   Pulse: 94   Resp: 18   Temp: 98.6 F (37 C)   SpO2: 92%       GEN: Alert, resting in bed, NAD  HEENT: NCAT  CV: normotensive  RESP: non-labored breathing  GI:  obese  Left LE:   - bruising over anterior incision which has a clean, dry dressing  - motor grossly intact to TA/GS, hard to follow commands  - SILT SP/DP nerves  - 2+ DP pulse      Labs:    Lab Results   Component Value Date    WBC 9.5 09/23/2021    HGB 10.6 (L) 09/23/2021    HCT 31.7 (L) 09/23/2021    MCV 94.8 09/23/2021    PLT 204 09/23/2021       Lab Results   Component Value Date    NA 139 09/23/2021    K 4.3 09/23/2021    CO2 27 09/23/2021    CL 104 09/23/2021    BUN 18 09/23/2021    CREATININE 1.1 09/23/2021           Intake/Output Summary (Last 24 hours) at 09/23/2021 1559  Last data filed at 09/23/2021 0600  Gross per 24 hour   Intake 100 ml   Output 950 ml   Net -850 ml       Drains:  none    Cultures:  none    Imaging:  PO XR:  - s/p L hip hemiarthroplasty for femoral neck fracture    A: Leslie Bradley is a 88 y.o. female now POD # 1 s/p L hip hemi for FNFx.      P:         Activity: continue PT/OT   ROM: ROMAT LLE  Antibiotics: Ancef complete, Keflex 500mg  QID for 7d  Weight Bearing Status: WBAT LLE  Pain:    Tylenol 1000mg  every 8 hours   Oxycodone 5mg  every 4 hours for pain 4-6/10   Oxycodone 10mg  every 4 hours for pain 7-10/10   Dilaudid for >10/10 breakthrough pain only  Diet: Reg diet  Drains/foley: none  DVT PPx: ASA 81mg  BID for 4 weeks   Imaging: complete  Wound: monocryl,  prineo  Dressing: mepilex AG, remove POD 8  Follow-up: 4 weeks  Dispo: to floor, SNF        , MD  [facility]  [phone] [facility].  [place], [place] [address] [address]  [address]  [place], [place]  O: [phone]  C: [phone]

## 2021-09-24 ENCOUNTER — Inpatient Hospital Stay: Admit: 2021-09-24 | Payer: MEDICARE

## 2021-09-24 ENCOUNTER — Inpatient Hospital Stay: Payer: MEDICARE

## 2021-09-24 LAB — BASIC METABOLIC PANEL W/ REFLEX TO MG FOR LOW K
Anion Gap: 5 (ref 3–16)
BUN: 16 mg/dL (ref 7–20)
CO2: 31 mmol/L (ref 21–32)
Calcium: 8.3 mg/dL (ref 8.3–10.6)
Chloride: 103 mmol/L (ref 99–110)
Creatinine: 1 mg/dL (ref 0.6–1.2)
Est, Glom Filt Rate: 53 — AB (ref >60)
Glucose: 144 mg/dL — ABNORMAL HIGH (ref 70–99)
Potassium reflex Magnesium: 4.2 mmol/L (ref 3.5–5.1)
Sodium: 139 mmol/L (ref 136–145)

## 2021-09-24 LAB — CBC WITH AUTO DIFFERENTIAL
Basophils %: 0.2 %
Basophils Absolute: 0 10*3/uL (ref 0.0–0.2)
Eosinophils %: 0.4 %
Eosinophils Absolute: 0 10*3/uL (ref 0.0–0.6)
Hematocrit: 30.4 % — ABNORMAL LOW (ref 36.0–48.0)
Hemoglobin: 10.3 g/dL — ABNORMAL LOW (ref 12.0–16.0)
Lymphocytes %: 13.3 %
Lymphocytes Absolute: 1.3 10*3/uL (ref 1.0–5.1)
MCH: 32.4 pg (ref 26.0–34.0)
MCHC: 33.8 g/dL (ref 31.0–36.0)
MCV: 95.7 fL (ref 80.0–100.0)
MPV: 9.1 fL (ref 5.0–10.5)
Monocytes %: 10.6 %
Monocytes Absolute: 1.1 10*3/uL (ref 0.0–1.3)
Neutrophils %: 75.5 %
Neutrophils Absolute: 7.7 10*3/uL (ref 1.7–7.7)
Platelets: 181 10*3/uL (ref 135–450)
RBC: 3.17 M/uL — ABNORMAL LOW (ref 4.00–5.20)
RDW: 13.9 % (ref 12.4–15.4)
WBC: 10.1 10*3/uL (ref 4.0–11.0)

## 2021-09-24 LAB — POCT GLUCOSE
POC Glucose: 102 mg/dl — ABNORMAL HIGH (ref 70–99)
POC Glucose: 116 mg/dl — ABNORMAL HIGH (ref 70–99)
POC Glucose: 127 mg/dl — ABNORMAL HIGH (ref 70–99)
POC Glucose: 128 mg/dl — ABNORMAL HIGH (ref 70–99)

## 2021-09-24 LAB — BRAIN NATRIURETIC PEPTIDE: Pro-BNP: 1611 pg/mL — ABNORMAL HIGH (ref 0–449)

## 2021-09-24 LAB — PROCALCITONIN: Procalcitonin: 0.17 ng/mL — ABNORMAL HIGH (ref 0.00–0.15)

## 2021-09-24 LAB — MAGNESIUM: Magnesium: 2 mg/dL (ref 1.80–2.40)

## 2021-09-24 MED ORDER — OLANZAPINE 10 MG IM SOLR
10 MG | Freq: Once | INTRAMUSCULAR | Status: AC
Start: 2021-09-24 — End: 2021-09-25

## 2021-09-24 MED ORDER — LACTATED RINGERS IV SOLN
INTRAVENOUS | Status: AC
Start: 2021-09-24 — End: 2021-09-24
  Administered 2021-09-24: 13:00:00 via INTRAVENOUS

## 2021-09-24 MED ORDER — BETHANECHOL CHLORIDE 10 MG PO TABS
10 MG | ORAL | Status: AC | PRN
Start: 2021-09-24 — End: 2021-09-25
  Administered 2021-09-25 (×3): 10 mg via ORAL

## 2021-09-24 MED ORDER — TAMSULOSIN HCL 0.4 MG PO CAPS
0.4 MG | Freq: Every day | ORAL | Status: DC
Start: 2021-09-24 — End: 2021-09-28
  Administered 2021-09-25 – 2021-09-28 (×5): 0.4 mg via ORAL

## 2021-09-24 MED FILL — SENNA-LAX 8.6 MG PO TABS: 8.6 MG | ORAL | Qty: 1

## 2021-09-24 MED FILL — OXYCODONE HCL 5 MG PO TABS: 5 MG | ORAL | Qty: 1

## 2021-09-24 MED FILL — ACETAMINOPHEN EXTRA STRENGTH 500 MG PO TABS: 500 MG | ORAL | Qty: 2

## 2021-09-24 MED FILL — POLYETHYLENE GLYCOL 3350 17 G PO PACK: 17 g | ORAL | Qty: 1

## 2021-09-24 MED FILL — OLANZAPINE 10 MG IM SOLR: 10 MG | INTRAMUSCULAR | Qty: 10

## 2021-09-24 MED FILL — METHOCARBAMOL 500 MG PO TABS: 500 MG | ORAL | Qty: 1

## 2021-09-24 MED FILL — ASPIRIN LOW DOSE 81 MG PO CHEW: 81 MG | ORAL | Qty: 1

## 2021-09-24 MED FILL — FAMOTIDINE 20 MG PO TABS: 20 MG | ORAL | Qty: 1

## 2021-09-24 MED FILL — VENLAFAXINE HCL 25 MG PO TABS: 25 MG | ORAL | Qty: 1

## 2021-09-24 MED FILL — HYDROMORPHONE HCL 1 MG/ML IJ SOLN: 1 MG/ML | INTRAMUSCULAR | Qty: 1

## 2021-09-24 MED FILL — AMLODIPINE BESYLATE 5 MG PO TABS: 5 MG | ORAL | Qty: 1

## 2021-09-24 MED FILL — MIRTAZAPINE 15 MG PO TABS: 15 MG | ORAL | Qty: 1

## 2021-09-24 NOTE — Progress Notes (Signed)
Pt alert and oriented to self only but disoriented to time, place and situation so re- orientation has been given. VSS on O2 inhalation at 2 L/m. Pain managed with scheduled and PRN medicines as per MAR. Is on regular diet and tolerating well. Pt voiding with pure wick and no bowel movement during this shift. Incontinence care provided. All standard safety precautions in place, call light within reach. No  fall injury. Will continue to monitor.
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
## 2021-09-24 NOTE — Plan of Care (Signed)
Problem: Safety - Adult  Goal: Free from fall injury  Outcome: Progressing  Note: Patient free from falls/ injury during duration of shift. Bed alarm on, call light w/in reach, bed in lowest position, non-skid socks on and fall sign posted outside door.       Problem: Discharge Planning  Goal: Discharge to home or other facility with appropriate resources  Outcome: Adequate for Discharge  Flowsheets (Taken 09/24/2021 0300 by Jerrilyn Cairo, RN)  Discharge to home or other facility with appropriate resources:   Identify barriers to discharge with patient and caregiver   Arrange for needed discharge resources and transportation as appropriate  Note: Skin cdi. Turning pt every 2 hours to prevent breakdown.

## 2021-09-24 NOTE — Progress Notes (Signed)
Patient's oxygen was off this AM when RN entered room and 02 sat was in the high 70's. Pt required 7 L 02 to obtain 02 sat above 92%. Medical resident Dr. Ladon Applebaum informed and respiratory informed.     Cleopatra Cedar, RN

## 2021-09-24 NOTE — Progress Notes (Signed)
Pt a/o to person only. VSS. Denies N/V. Neuro checks WDL. Pt reporting pain of a 6. Prn oxycodone given w/ relief. Pt got up to [facility] w/ sara stedy, tolerated poorly and took x3 persons to assist. Will continue to monitor patient.     Cleopatra Cedar, RN

## 2021-09-24 NOTE — Progress Notes (Signed)
Progress Note    Admit Date: 09/21/2021  Day: 3  Diet: ADULT DIET; Regular    CC: Fall and hip pain    Interval history:   Pt seen and examined at bedside. No acute events overnight. Given baseline dementia, pt has been pulling lines. Received perfect serve this morning that pt had acute increase in O2 requirement to 7 L from 2 L; on questioning, it seems pt had pulled cannula an unspecified time prior. Remains hemodynamically stable. No associated chest pain or SOB. In the room able to be weaned to 0 L with good saturation (93%), but once pt started snoring, saturations dropped. Left O2 at 2.5 L when exiting the room with good tolerance. Pending placement.      HPI:     Leslie Bradley is a 88 y.o. female with pmhx dementia and HTN who presents to the emergency department from assisted living with left hip pain after fall. Patient unable to provide history d/t dementia, facility reports they are concerned she tripped and fell, the fall was not witnessed. Staff alerted by patient screaming and found patient on the ground holding onto someone's legs. Patient not on any anticoagulation.      On arrival to ED patient afebrile, hypertensive, regular rate satting well on RA. On initial evaluation by ED, patient complaining of L hip pain, L leg shorter compared right. XR showed impacted L femoral neck fracture with superior migration. Seen by Ortho, patient has displaced left hip subcapital femur fracture, plan to operate tomorrow if patient medically stable. Per daughter patient is at baseline mental status. Patient admitted for further management.    Medications:     Scheduled Meds:   polyethylene glycol  17 g Oral Daily    senna  1 tablet Oral Daily    aspirin  81 mg Oral BID    methocarbamol  500 mg Oral TID    acetaminophen  1,000 mg Oral 3 times per day    famotidine  20 mg Oral Daily    mirtazapine  15 mg Oral Nightly    amLODIPine  5 mg Oral Daily    [Held by provider] furosemide  20 mg Oral Daily    venlafaxine   100 mg Oral BID WC    sodium chloride flush  5-40 mL IntraVENous 2 times per day     Continuous Infusions:   [Held by provider] lactated ringers IV soln 75 mL/hr at 09/24/21 0856    sodium chloride      dextrose       PRN Meds:HYDROmorphone, oxyCODONE **OR** oxyCODONE, melatonin, sodium chloride flush, sodium chloride, ondansetron **OR** ondansetron, glucose, dextrose bolus **OR** dextrose bolus, glucagon (rDNA), dextrose    Objective:   Vitals:   T-max:  Patient Vitals for the past 8 hrs:   BP Temp Temp src Pulse Resp SpO2   09/24/21 0951 -- -- -- -- -- 95 %   09/24/21 0646 100/69 97 F (36.1 C) Temporal 100 16 92 %   09/24/21 0339 -- -- -- -- 16 --   09/24/21 0309 -- -- -- -- 16 --       Intake/Output Summary (Last 24 hours) at 09/24/2021 1105  Last data filed at 09/24/2021 0600  Gross per 24 hour   Intake 0 ml   Output 500 ml   Net -500 ml     Review of systems  A 10 point ROS was conducted with pertinent positives and negatives listed as above    Physical Exam  Constitutional:       General: She is not in acute distress.     Appearance: She is obese.   HENT:      Head: Normocephalic.      Mouth/Throat:      Mouth: Mucous membranes are moist.   Cardiovascular:      Rate and Rhythm: Normal rate and regular rhythm.      Pulses: Normal pulses.   Pulmonary:      Effort: Pulmonary effort is normal.      Breath sounds: Unable to adequately assess; pt unable to take deep breath sounds on command given baseline mental status  Abdominal:      General: Bowel sounds are normal.      Palpations: Abdomen is soft.   Musculoskeletal:      Cervical back: Normal range of motion.      Comments: L hip with dressing  Skin:     General: Skin is warm and dry.   Neurological:      Mental Status: She is disoriented. Waxing and waning mental status at baseline     Comments: Oriented to self only (with maiden name [name] not married name), per daughter this is her baseline         LABS:    CBC:   Recent Labs     09/22/21  0720  09/23/21  0742 09/24/21  0929   WBC 7.7 9.5 10.1   HGB 13.0 10.6* 10.3*   HCT 39.6 31.7* 30.4*   PLT 219 204 181   MCV 94.2 94.8 95.7     Renal:    Recent Labs     09/22/21  0720 09/23/21  0742 09/24/21  0929   NA 137 139 139   K 4.2 4.3 4.2   CL 99 104 103   CO2 28 27 31    BUN 14 18 16    CREATININE 0.8 1.1 1.0   GLUCOSE 114* 108* 144*   CALCIUM 9.1 8.3 8.3   MG 2.10 2.00 2.00   ANIONGAP 10 8 5      Hepatic: No results for input(s): AST, ALT, BILITOT, BILIDIR, PROT, LABALBU, ALKPHOS in the last 72 hours.  Troponin: No results for input(s): TROPONINI in the last 72 hours.  BNP: No results for input(s): BNP in the last 72 hours.  Lipids: No results for input(s): CHOL, HDL in the last 72 hours.    Invalid input(s): LDLCALCU, TRIGLYCERIDE  ABGs:  No results for input(s): PHART, PCO2ART, PO2ART, HCO3ART, BEART, THGBART, O2SATART, TCO2ART in the last 72 hours.    INR: No results for input(s): INR in the last 72 hours.  Lactate: No results for input(s): LACTATE in the last 72 hours.  Cultures:  -----------------------------------------------------------------  RAD:   XR PELVIS (1-2 VIEWS)   Final Result      Postsurgical changes of left total hip arthroplasty without evidence of   immediate complication.      XR HIP 2-3 VW W PELVIS LEFT   Final Result   Operative fluoroscopy, as above.      FLUORO FOR SURGICAL PROCEDURES   Final Result   Operative fluoroscopy, as above.      XR CHEST PORTABLE   Final Result      Femoral neck acute transverse oblique fracture, likely subcapital in position      Mid to distal femur remains intact including the knee joint.      SINGLE AP VIEW OF THE PELVIS       HISTORY: Left  hip and pelvic pain.      PROCEDURE: Single AP supine view of the pelvis was obtained      FINDINGS:      Impacted left femoral neck fracture is noted with superior migration of the   greater trochanter. Right femoral head and neck and proximal hip appear intact.      The remainder the pelvis is intact. There are no  unusual calcifications.      IMPRESSION:      Impacted left femoral neck fracture with superior migration, likely subcapital   in position, transverse or oblique.      CHEST PORTABLE VIEW      HISTORY: Larey Seat, hip fracture, preoperative   COMPARISON: February 08, 2018      PROCEDURE: AP portable semierect upright view of the chest was obtained at 1435   hours      FINDINGS:       LINES: No central lines or tube      LUNGS: Lungs appear grossly clear      HEART: Heart size borderline. Atherosclerotic change of aorta      There is no evidence of pneumothorax      IMPRESSION:      1. No acute process or consolidation   2. Atherosclerotic aorta.               XR FEMUR LEFT (MIN 2 VIEWS)   Final Result      Femoral neck acute transverse oblique fracture, likely subcapital in position      Mid to distal femur remains intact including the knee joint.      SINGLE AP VIEW OF THE PELVIS       HISTORY: Left hip and pelvic pain.      PROCEDURE: Single AP supine view of the pelvis was obtained      FINDINGS:      Impacted left femoral neck fracture is noted with superior migration of the   greater trochanter. Right femoral head and neck and proximal hip appear intact.      The remainder the pelvis is intact. There are no unusual calcifications.      IMPRESSION:      Impacted left femoral neck fracture with superior migration, likely subcapital   in position, transverse or oblique.      CHEST PORTABLE VIEW      HISTORY: Larey Seat, hip fracture, preoperative   COMPARISON: February 08, 2018      PROCEDURE: AP portable semierect upright view of the chest was obtained at 1435   hours      FINDINGS:       LINES: No central lines or tube      LUNGS: Lungs appear grossly clear      HEART: Heart size borderline. Atherosclerotic change of aorta      There is no evidence of pneumothorax      IMPRESSION:      1. No acute process or consolidation   2. Atherosclerotic aorta.               XR PELVIS (1-2 VIEWS)   Final Result      Femoral neck acute  transverse oblique fracture, likely subcapital in position      Mid to distal femur remains intact including the knee joint.      SINGLE AP VIEW OF THE PELVIS       HISTORY: Left hip and pelvic pain.      PROCEDURE: Single AP supine view of the  pelvis was obtained      FINDINGS:      Impacted left femoral neck fracture is noted with superior migration of the   greater trochanter. Right femoral head and neck and proximal hip appear intact.      The remainder the pelvis is intact. There are no unusual calcifications.      IMPRESSION:      Impacted left femoral neck fracture with superior migration, likely subcapital   in position, transverse or oblique.      CHEST PORTABLE VIEW      HISTORY: Larey Seat, hip fracture, preoperative   COMPARISON: February 08, 2018      PROCEDURE: AP portable semierect upright view of the chest was obtained at 1435   hours      FINDINGS:       LINES: No central lines or tube      LUNGS: Lungs appear grossly clear      HEART: Heart size borderline. Atherosclerotic change of aorta      There is no evidence of pneumothorax      IMPRESSION:      1. No acute process or consolidation   2. Atherosclerotic aorta.               XR CHEST PORTABLE    (Results Pending)       Assessment/Plan:     Displaced L Hip Subcapital Femur Fracture   Seen by ortho in ED, had left hip hemiarthroplasty on 8/24 with Dr. Ricki Miller. Tolerated well  - Orthopedic surgery consulted; appreciate recs   - left hip hemiarthroplasty on 8/24   - PT/OT consulted   - Weight bearing as tolerated   - ASA 81 mg BID for 4 weeks   - F/u in 4 weeks   - Pain regimen:   - Oxycodone 5 mg q4h prn   - Dilaudid 0.25 mg IV q4h prn for breakthrough pain   - Robaxin 500 mg TID     Increased O2 requirements  Suspect it is 2/2 to pt pulling lines and not having NC in. Pt also likely has component of sleep apnea as saturations worsen when snoring. Little suspicion for PE at this time given no associated tachycardia, chest pain, or report of SOB.   -  Continue oxygen supplementation as needed  - CXR ordered    Limited urine output, improved  Improved urine output today, 500 mL recorded total. Bladder scan showed almost no urine in the bladder yesterday. Suspect initial reduced urinary output primarily due to low volume status compiled with physiologic ADH secretion from stress. Creatinine is stable suggesting no signs of kidney damage at this time.    Chronic Medical Conditions     HTN   - Continue home amlodipine 5 mg   - Continue home lasix 20 mg      Anxiety   - Continue home venlafaxine 100 mg BID     Code Status: DNR-CCA  FEN: Adult diet; regular  PPX: pepcid, ASA 81 BID  DISPO: GGMF    Girard Cooter, MD, PGY-1  09/24/21  11:05 AM    This patient has been staffed and discussed with Virgilio Belling, MD.

## 2021-09-25 LAB — CBC WITH AUTO DIFFERENTIAL
Basophils %: 0.1 %
Basophils Absolute: 0 10*3/uL (ref 0.0–0.2)
Eosinophils %: 3.9 %
Eosinophils Absolute: 0.3 10*3/uL (ref 0.0–0.6)
Hematocrit: 28.9 % — ABNORMAL LOW (ref 36.0–48.0)
Hemoglobin: 9.5 g/dL — ABNORMAL LOW (ref 12.0–16.0)
Lymphocytes %: 12.5 %
Lymphocytes Absolute: 1 10*3/uL (ref 1.0–5.1)
MCH: 32.1 pg (ref 26.0–34.0)
MCHC: 33 g/dL (ref 31.0–36.0)
MCV: 97.4 fL (ref 80.0–100.0)
MPV: 8.7 fL (ref 5.0–10.5)
Monocytes %: 8.6 %
Monocytes Absolute: 0.7 10*3/uL (ref 0.0–1.3)
Neutrophils %: 74.9 %
Neutrophils Absolute: 5.8 10*3/uL (ref 1.7–7.7)
Platelets: 168 10*3/uL (ref 135–450)
RBC: 2.97 M/uL — ABNORMAL LOW (ref 4.00–5.20)
RDW: 14.3 % (ref 12.4–15.4)
WBC: 7.7 10*3/uL (ref 4.0–11.0)

## 2021-09-25 LAB — POCT GLUCOSE
POC Glucose: 100 mg/dl — ABNORMAL HIGH (ref 70–99)
POC Glucose: 164 mg/dl — ABNORMAL HIGH (ref 70–99)
POC Glucose: 179 mg/dl — ABNORMAL HIGH (ref 70–99)
POC Glucose: 131 mg/dl — ABNORMAL HIGH (ref 70–99)

## 2021-09-25 LAB — BASIC METABOLIC PANEL W/ REFLEX TO MG FOR LOW K
Anion Gap: 7 (ref 3–16)
BUN: 17 mg/dL (ref 7–20)
CO2: 27 mmol/L (ref 21–32)
Calcium: 8.2 mg/dL — ABNORMAL LOW (ref 8.3–10.6)
Chloride: 105 mmol/L (ref 99–110)
Creatinine: 0.9 mg/dL (ref 0.6–1.2)
Est, Glom Filt Rate: >60 (ref >60)
Glucose: 147 mg/dL — ABNORMAL HIGH (ref 70–99)
Potassium reflex Magnesium: 4.3 mmol/L (ref 3.5–5.1)
Sodium: 139 mmol/L (ref 136–145)

## 2021-09-25 LAB — MAGNESIUM: Magnesium: 2.1 mg/dL (ref 1.80–2.40)

## 2021-09-25 MED FILL — FUROSEMIDE 20 MG PO TABS: 20 MG | ORAL | Qty: 1

## 2021-09-25 MED FILL — OXYCODONE HCL 5 MG PO TABS: 5 MG | ORAL | Qty: 1

## 2021-09-25 MED FILL — ACETAMINOPHEN EXTRA STRENGTH 500 MG PO TABS: 500 MG | ORAL | Qty: 2

## 2021-09-25 MED FILL — ASPIRIN LOW DOSE 81 MG PO CHEW: 81 MG | ORAL | Qty: 1

## 2021-09-25 MED FILL — BETHANECHOL CHLORIDE 10 MG PO TABS: 10 MG | ORAL | Qty: 1

## 2021-09-25 MED FILL — METHOCARBAMOL 500 MG PO TABS: 500 MG | ORAL | Qty: 1

## 2021-09-25 MED FILL — VENLAFAXINE HCL 25 MG PO TABS: 25 MG | ORAL | Qty: 1

## 2021-09-25 MED FILL — POLYETHYLENE GLYCOL 3350 17 G PO PACK: 17 g | ORAL | Qty: 1

## 2021-09-25 MED FILL — FAMOTIDINE 20 MG PO TABS: 20 MG | ORAL | Qty: 1

## 2021-09-25 MED FILL — MIRTAZAPINE 15 MG PO TABS: 15 MG | ORAL | Qty: 1

## 2021-09-25 MED FILL — TAMSULOSIN HCL 0.4 MG PO CAPS: 0.4 MG | ORAL | Qty: 1

## 2021-09-25 MED FILL — SENNA-LAX 8.6 MG PO TABS: 8.6 MG | ORAL | Qty: 1

## 2021-09-25 MED FILL — AMLODIPINE BESYLATE 5 MG PO TABS: 5 MG | ORAL | Qty: 1

## 2021-09-25 NOTE — Plan of Care (Signed)
Problem: Safety - Adult  Goal: Free from fall injury  09/25/2021 0157 by Lynn Ito, RN  Outcome: Progressing  All fall precautions in place. Bed locked and in lowest position with alarm on. Overbed table and personal belonings within reach. Call light within reach and patient instructed to use call light for assistance. Non-skid socks on.    Problem: Pain  Goal: Verbalizes/displays adequate comfort level or baseline comfort level  09/25/2021 0157 by Lynn Ito, RN  Outcome: Progressing   Denies pain at this time. [employer] Engineer, production of 0.

## 2021-09-25 NOTE — Progress Notes (Signed)
Pt unable to void. This RN in contact with resident. Urecholine administered per MAR, flomax administered per MAR, Pt still unable to urinate. This RN notified resident, ordered to straight cath.    Pt straight cathed for of clear amber urine via sterile technique. Pt tolerated okay.

## 2021-09-25 NOTE — Progress Notes (Signed)
VSS on 4L O2 via NC. No acute events this shift. Pt oriented to self only, pleasant, but needs frequent redirection. Tolerating oral diet and PO fluids, no S/S of N/V. Frequent repositioning every 2 hours with pillow support provided for pressure relief and comfort. Specialty mattress ordered. Pt straight cathed x1 this shift d/t inability to void. All fall precautions in place.

## 2021-09-25 NOTE — Progress Notes (Signed)
Progress Note    Admit Date: 09/21/2021  Day: 3  Diet: ADULT DIET; Regular    CC: Fall and hip pain    Interval history:   Pt seen and examined at bedside. Required x1 straight cath overnight for urinary retention (500-600 mL on bladder scan). Otherwise no acute events overnight. Denies any chest pain, N/V, SOB, or abdominal pain. Remaining hemodynamically stable. Pending placement    HPI:     Bonnee Zertuche is a 88 y.o. female with pmhx dementia and HTN who presents to the emergency department from assisted living with left hip pain after fall. Patient unable to provide history d/t dementia, facility reports they are concerned she tripped and fell, the fall was not witnessed. Staff alerted by patient screaming and found patient on the ground holding onto someone's legs. Patient not on any anticoagulation.      On arrival to ED patient afebrile, hypertensive, regular rate satting well on RA. On initial evaluation by ED, patient complaining of L hip pain, L leg shorter compared right. XR showed impacted L femoral neck fracture with superior migration. Seen by Ortho, patient has displaced left hip subcapital femur fracture, plan to operate tomorrow if patient medically stable. Per daughter patient is at baseline mental status. Patient admitted for further management.    Medications:     Scheduled Meds:   tamsulosin  0.4 mg Oral Daily    polyethylene glycol  17 g Oral Daily    senna  1 tablet Oral Daily    aspirin  81 mg Oral BID    methocarbamol  500 mg Oral TID    acetaminophen  1,000 mg Oral 3 times per day    famotidine  20 mg Oral Daily    mirtazapine  15 mg Oral Nightly    amLODIPine  5 mg Oral Daily    furosemide  20 mg Oral Daily    venlafaxine  100 mg Oral BID WC    sodium chloride flush  5-40 mL IntraVENous 2 times per day     Continuous Infusions:   sodium chloride      dextrose       PRN Meds:HYDROmorphone, oxyCODONE **OR** oxyCODONE, melatonin, sodium chloride flush, sodium chloride, ondansetron **OR**  ondansetron, glucose, dextrose bolus **OR** dextrose bolus, glucagon (rDNA), dextrose    Objective:   Vitals:   T-max:  Patient Vitals for the past 8 hrs:   BP Temp Temp src Pulse Resp SpO2   09/25/21 1202 -- -- -- -- 16 --   09/25/21 1115 111/70 -- -- 93 -- --   09/25/21 1100 (!) 107/55 98.4 F (36.9 C) Temporal 88 18 93 %   09/25/21 0651 134/66 98 F (36.7 C) Oral 96 17 95 %       Intake/Output Summary (Last 24 hours) at 09/25/2021 1242  Last data filed at 09/25/2021 9811  Gross per 24 hour   Intake 320 ml   Output 600 ml   Net -280 ml     Review of systems  A 10 point ROS was conducted with pertinent positives and negatives listed as above    Physical Exam  Constitutional:       General: She is not in acute distress.     Appearance: She is obese.   HENT:      Head: Normocephalic.      Mouth/Throat:      Mouth: Mucous membranes are moist.   Cardiovascular:      Rate and Rhythm: Normal rate  and regular rhythm.      Pulses: Normal pulses.   Pulmonary:      Effort: Pulmonary effort is normal.      Breath sounds: Lung sounds clear to auscultation  Abdominal:      General: Bowel sounds are normal.      Palpations: Abdomen is soft.   Musculoskeletal:      Cervical back: Normal range of motion.      Comments: L hip with dressing  Skin:     General: Skin is warm and dry.   Neurological:      Mental Status: She is disoriented. Waxing and waning mental status at baseline     Comments: Oriented to self only (with maiden name [name] not married name), per daughter this is her baseline      LABS:    CBC:   Recent Labs     09/23/21  0742 09/24/21  0929 09/25/21  0753   WBC 9.5 10.1 7.7   HGB 10.6* 10.3* 9.5*   HCT 31.7* 30.4* 28.9*   PLT 204 181 168   MCV 94.8 95.7 97.4     Renal:    Recent Labs     09/23/21  0742 09/24/21  0929 09/25/21  0753   NA 139 139 139   K 4.3 4.2 4.3   CL 104 103 105   CO2 27 31 27    BUN 18 16 17    CREATININE 1.1 1.0 0.9   GLUCOSE 108* 144* 147*   CALCIUM 8.3 8.3 8.2*   MG 2.00 2.00 2.10   ANIONGAP 8 5  7      Hepatic: No results for input(s): AST, ALT, BILITOT, BILIDIR, PROT, LABALBU, ALKPHOS in the last 72 hours.  Troponin: No results for input(s): TROPONINI in the last 72 hours.  BNP: No results for input(s): BNP in the last 72 hours.  Lipids: No results for input(s): CHOL, HDL in the last 72 hours.    Invalid input(s): LDLCALCU, TRIGLYCERIDE  ABGs:  No results for input(s): PHART, PCO2ART, PO2ART, HCO3ART, BEART, THGBART, O2SATART, TCO2ART in the last 72 hours.    INR: No results for input(s): INR in the last 72 hours.  Lactate: No results for input(s): LACTATE in the last 72 hours.  Cultures:  -----------------------------------------------------------------  RAD:   XR CHEST PORTABLE   Final Result      1. No acute disease.      11:34 a.m.      XR PELVIS (1-2 VIEWS)   Final Result      Postsurgical changes of left total hip arthroplasty without evidence of   immediate complication.      XR HIP 2-3 VW W PELVIS LEFT   Final Result   Operative fluoroscopy, as above.      FLUORO FOR SURGICAL PROCEDURES   Final Result   Operative fluoroscopy, as above.      XR CHEST PORTABLE   Final Result      Femoral neck acute transverse oblique fracture, likely subcapital in position      Mid to distal femur remains intact including the knee joint.      SINGLE AP VIEW OF THE PELVIS       HISTORY: Left hip and pelvic pain.      PROCEDURE: Single AP supine view of the pelvis was obtained      FINDINGS:      Impacted left femoral neck fracture is noted with superior migration of the   greater trochanter. Right femoral  head and neck and proximal hip appear intact.      The remainder the pelvis is intact. There are no unusual calcifications.      IMPRESSION:      Impacted left femoral neck fracture with superior migration, likely subcapital   in position, transverse or oblique.      CHEST PORTABLE VIEW      HISTORY: Larey Seat, hip fracture, preoperative   COMPARISON: February 08, 2018      PROCEDURE: AP portable semierect upright view of  the chest was obtained at 1435   hours      FINDINGS:       LINES: No central lines or tube      LUNGS: Lungs appear grossly clear      HEART: Heart size borderline. Atherosclerotic change of aorta      There is no evidence of pneumothorax      IMPRESSION:      1. No acute process or consolidation   2. Atherosclerotic aorta.               XR FEMUR LEFT (MIN 2 VIEWS)   Final Result      Femoral neck acute transverse oblique fracture, likely subcapital in position      Mid to distal femur remains intact including the knee joint.      SINGLE AP VIEW OF THE PELVIS       HISTORY: Left hip and pelvic pain.      PROCEDURE: Single AP supine view of the pelvis was obtained      FINDINGS:      Impacted left femoral neck fracture is noted with superior migration of the   greater trochanter. Right femoral head and neck and proximal hip appear intact.      The remainder the pelvis is intact. There are no unusual calcifications.      IMPRESSION:      Impacted left femoral neck fracture with superior migration, likely subcapital   in position, transverse or oblique.      CHEST PORTABLE VIEW      HISTORY: Larey Seat, hip fracture, preoperative   COMPARISON: February 08, 2018      PROCEDURE: AP portable semierect upright view of the chest was obtained at 1435   hours      FINDINGS:       LINES: No central lines or tube      LUNGS: Lungs appear grossly clear      HEART: Heart size borderline. Atherosclerotic change of aorta      There is no evidence of pneumothorax      IMPRESSION:      1. No acute process or consolidation   2. Atherosclerotic aorta.               XR PELVIS (1-2 VIEWS)   Final Result      Femoral neck acute transverse oblique fracture, likely subcapital in position      Mid to distal femur remains intact including the knee joint.      SINGLE AP VIEW OF THE PELVIS       HISTORY: Left hip and pelvic pain.      PROCEDURE: Single AP supine view of the pelvis was obtained      FINDINGS:      Impacted left femoral neck fracture  is noted with superior migration of the   greater trochanter. Right femoral head and neck and proximal hip appear intact.      The remainder the  pelvis is intact. There are no unusual calcifications.      IMPRESSION:      Impacted left femoral neck fracture with superior migration, likely subcapital   in position, transverse or oblique.      CHEST PORTABLE VIEW      HISTORY: Larey Seat, hip fracture, preoperative   COMPARISON: February 08, 2018      PROCEDURE: AP portable semierect upright view of the chest was obtained at 1435   hours      FINDINGS:       LINES: No central lines or tube      LUNGS: Lungs appear grossly clear      HEART: Heart size borderline. Atherosclerotic change of aorta      There is no evidence of pneumothorax      IMPRESSION:      1. No acute process or consolidation   2. Atherosclerotic aorta.                   Assessment/Plan:     Displaced L Hip Subcapital Femur Fracture   Seen by ortho in ED, had left hip hemiarthroplasty on 8/24 with Dr. Ricki Miller. Tolerated well  - Orthopedic surgery consulted; appreciate recs   - left hip hemiarthroplasty on 8/24   - PT/OT consulted   - Weight bearing as tolerated   - ASA 81 mg BID for 4 weeks   - F/u in 4 weeks   - Pain regimen:   - Oxycodone 5 mg q4h prn   - Dilaudid 0.25 mg IV q4h prn for breakthrough pain   - Robaxin 500 mg TID     Increased O2 requirements  Suspect it is 2/2 to pt pulling lines and not having NC in. Pt also likely has component of sleep apnea as saturations worsen when snoring. Little suspicion for PE at this time given no associated tachycardia, chest pain, or report of SOB. CXR negative for infection.  - Continue oxygen supplementation as needed  - Encouraged incentive spirometry    Limited urine output, improved  Improving urine output. Suspect initial reduced urinary output primarily due to low volume status compiled with physiologic ADH secretion from stress. Creatinine is stable suggesting no signs of kidney damage at this time.  Was retaining overnight, likely due to lack of ambulation.  - Will monitor urine output, straight cath prn. Will place foley temporarily if continuing to retain  - Continue flomax     Chronic Medical Conditions     HTN   - Continue home amlodipine 5 mg   - Continue home lasix 20 mg      Anxiety   - Continue home venlafaxine 100 mg BID     Code Status: DNR-CCA  FEN: Adult diet; regular  PPX: pepcid, ASA 81 BID  DISPO: GGMF    Girard Cooter, MD, PGY-1  09/25/21  12:42 PM    This patient has been staffed and discussed with Virgilio Belling, MD.

## 2021-09-25 NOTE — Progress Notes (Signed)
Patient back to bed. Dressing changed d/t patient picking at it. Urine output only this shift, current bladder scan is 47ml, resident notified and no new orders. Gave oxycodone for pain associated with surgical site, advanced dementia scale used.

## 2021-09-25 NOTE — Plan of Care (Signed)
Problem: Pain  Goal: Verbalizes/displays adequate comfort level or baseline comfort level  09/25/2021 0746 by Ronald Lobo, RN  Outcome: Progressing   Pt endorsing pain to left hip. Being treated with PRN pain medication, rest, and frequent repositioning with pillow support for comfort and pressure relief. Pt reports some relief from pain with above interventions.   Problem: Safety - Adult  Goal: Free from fall injury  09/25/2021 0746 by Ronald Lobo, RN  Outcome: Progressing   All fall precautions in place. Bed locked and in lowest position with alarm on. Overbed table and personal belonings within reach. Call light within reach and patient instructed to use call light for assistance. Non-skid socks on.

## 2021-09-25 NOTE — Progress Notes (Signed)
Patient is alert, but only oriented to self. Patient is disoriented to time, place and situation. VSS. Patient had a urine occurrence today on the toilet. X2 sara stedy to the bathroom. Patient has not had a bowel movement today. No acute events to report this shift. Patient does pull at nasal cannula and left hip dressing. Patient is currently up in chair with daughters in the room. All standard safety precautions in place. Plan of care continues.

## 2021-09-26 LAB — CBC WITH AUTO DIFFERENTIAL
Basophils %: 0.5 %
Basophils Absolute: 0 10*3/uL (ref 0.0–0.2)
Eosinophils %: 5.5 %
Eosinophils Absolute: 0.3 10*3/uL (ref 0.0–0.6)
Hematocrit: 30.2 % — ABNORMAL LOW (ref 36.0–48.0)
Hemoglobin: 10.1 g/dL — ABNORMAL LOW (ref 12.0–16.0)
Lymphocytes %: 17.5 %
Lymphocytes Absolute: 1.1 10*3/uL (ref 1.0–5.1)
MCH: 31.7 pg (ref 26.0–34.0)
MCHC: 33.4 g/dL (ref 31.0–36.0)
MCV: 94.8 fL (ref 80.0–100.0)
MPV: 8.3 fL (ref 5.0–10.5)
Monocytes %: 10.3 %
Monocytes Absolute: 0.6 10*3/uL (ref 0.0–1.3)
Neutrophils %: 66.2 %
Neutrophils Absolute: 4.1 10*3/uL (ref 1.7–7.7)
Platelets: 191 10*3/uL (ref 135–450)
RBC: 3.18 M/uL — ABNORMAL LOW (ref 4.00–5.20)
RDW: 14 % (ref 12.4–15.4)
WBC: 6.2 10*3/uL (ref 4.0–11.0)

## 2021-09-26 LAB — BASIC METABOLIC PANEL W/ REFLEX TO MG FOR LOW K
Anion Gap: 3 (ref 3–16)
BUN: 15 mg/dL (ref 7–20)
CO2: 33 mmol/L — ABNORMAL HIGH (ref 21–32)
Calcium: 8.6 mg/dL (ref 8.3–10.6)
Chloride: 101 mmol/L (ref 99–110)
Creatinine: 0.8 mg/dL (ref 0.6–1.2)
Est, Glom Filt Rate: >60 (ref >60)
Glucose: 100 mg/dL — ABNORMAL HIGH (ref 70–99)
Potassium reflex Magnesium: 4.1 mmol/L (ref 3.5–5.1)
Sodium: 137 mmol/L (ref 136–145)

## 2021-09-26 LAB — POCT GLUCOSE
POC Glucose: 155 mg/dl — ABNORMAL HIGH (ref 70–99)
POC Glucose: 74 mg/dl (ref 70–99)
POC Glucose: 145 mg/dl — ABNORMAL HIGH (ref 70–99)

## 2021-09-26 LAB — MAGNESIUM: Magnesium: 2 mg/dL (ref 1.80–2.40)

## 2021-09-26 MED ORDER — TAMSULOSIN HCL 0.4 MG PO CAPS
0.4 MG | ORAL_CAPSULE | Freq: Every day | ORAL | 0 refills | Status: DC
Start: 2021-09-26 — End: 2021-09-26

## 2021-09-26 MED ORDER — ASPIRIN 81 MG PO CHEW
81 MG | ORAL_TABLET | Freq: Every day | ORAL | 0 refills | Status: DC
Start: 2021-09-26 — End: 2022-04-21

## 2021-09-26 MED ORDER — AMLODIPINE BESYLATE 5 MG PO TABS
5 MG | ORAL_TABLET | Freq: Every day | ORAL | 3 refills | Status: AC
Start: 2021-09-26 — End: ?

## 2021-09-26 MED FILL — ACETAMINOPHEN EXTRA STRENGTH 500 MG PO TABS: 500 MG | ORAL | Qty: 2

## 2021-09-26 MED FILL — VENLAFAXINE HCL 25 MG PO TABS: 25 MG | ORAL | Qty: 1

## 2021-09-26 MED FILL — TAMSULOSIN HCL 0.4 MG PO CAPS: 0.4 MG | ORAL | Qty: 1

## 2021-09-26 MED FILL — AMLODIPINE BESYLATE 5 MG PO TABS: 5 MG | ORAL | Qty: 1

## 2021-09-26 MED FILL — FAMOTIDINE 20 MG PO TABS: 20 MG | ORAL | Qty: 1

## 2021-09-26 MED FILL — SENNA-LAX 8.6 MG PO TABS: 8.6 MG | ORAL | Qty: 1

## 2021-09-26 MED FILL — FUROSEMIDE 20 MG PO TABS: 20 MG | ORAL | Qty: 1

## 2021-09-26 MED FILL — METHOCARBAMOL 500 MG PO TABS: 500 MG | ORAL | Qty: 1

## 2021-09-26 MED FILL — OXYCODONE HCL 5 MG PO TABS: 5 MG | ORAL | Qty: 1

## 2021-09-26 MED FILL — POLYETHYLENE GLYCOL 3350 17 G PO PACK: 17 g | ORAL | Qty: 1

## 2021-09-26 MED FILL — ASPIRIN LOW DOSE 81 MG PO CHEW: 81 MG | ORAL | Qty: 1

## 2021-09-26 NOTE — Progress Notes (Signed)
Physical Therapy  Facility/Department: [facility]  Physical Therapy Treatment    Name: Leslie Bradley  DOB: Nov 16, 1928  MRN: [id number]  Date of Service: 09/26/2021    Discharge Recommendations:    Leslie Bradley scored a 6/24 on the AM-PAC short mobility form. Current research shows that an AM-PAC score of 17 or less is typically not associated with a discharge to the patient's home setting. Based on the patient's AM-PAC score and their current functional mobility deficits, it is recommended that the patient have 3-5 sessions per week of Physical Therapy at d/c to increase the patient's independence.  Please see assessment section for further patient specific details.    If patient discharges prior to next session this note will serve as a discharge summary.  Please see below for the latest assessment towards goals.     PT Equipment Recommendations  Equipment Needed: No (defer to next level of care)      Patient Diagnosis(es): The encounter diagnosis was Closed fracture of left hip, initial encounter (HCC).  Past Medical History:  has a past medical history of Breast cancer (HCC) and Hypertension.  Past Surgical History:  has a past surgical history that includes Breast lumpectomy and hip surgery (Left, 09/22/2021).    Assessment   Body Structures, Functions, Activity Limitations Requiring Skilled Therapeutic Intervention: Decreased functional mobility ;Decreased cognition;Decreased safe awareness;Decreased strength;Decreased balance;Increased pain;Decreased posture  Assessment: Pt requiring Ax2 for transfers. Pt continues to be limited by decreased strength, balance, endurance and cognition. Pt remains well below her baseline and would benefit from further skilled PT to maximize safety and independence with functional mobility. Will continue to follow.  Treatment Diagnosis: Decreased functional mobility  Barriers to Learning: cognition  Activity Tolerance  Activity Tolerance: Patient limited by  endurance;Patient limited by fatigue;Patient tolerated treatment well;Treatment limited secondary to decreased cognition     Plan   Physcial Therapy Plan  General Plan: 5-7 times per week  Current Treatment Recommendations: Strengthening, Balance training, Functional mobility training, Transfer training, Gait training, Endurance training, Safety education & training, Patient/Caregiver education & training, Therapeutic activities  Safety Devices  Type of Devices: Call light within reach, Chair alarm in place, Nurse notified, Left in chair, Gait belt     Restrictions  Position Activity Restriction  Other position/activity restrictions: WBAT, ambulate, ant hip precautions     Subjective   General  Chart Reviewed: Yes  Additional Pertinent Hx: 88 y/o pt s/p LEFT HIP HEMIARTHROPLASTY ANTERIOR APPROACH.  Family / Caregiver Present: Yes (Daughter's MIL)  Referring Practitioner: Ricki Miller, MD  Diagnosis: s/p LEFT HIP HEMIARTHROPLASTY ANTERIOR APPROACH  Follows Commands: Within Functional Limits  Subjective  Subjective: Pt found sitting on toilet with stedy and RNs present, denying pain and agreeable to therapy.         Social/Functional History  Social/Functional History  Type of Home: Facility Psychologist, occupational Care)  ADL Assistance: Needs assistance (set up, cues, min assist)  Ambulation Assistance: Independent    Cognition   Orientation  Overall Orientation Status: Impaired  Orientation Level: Oriented to person;Disoriented to place;Disoriented to time;Disoriented to situation  Cognition  Overall Cognitive Status: Exceptions     Objective   Pulse: 93  Heart Rate Source: Monitor  BP: (!) 145/75  BP Location: Right upper arm  BP Method: Automatic  Patient Position: Semi fowlers  MAP (Calculated): 98  Respirations: 18  SpO2: 97 %  O2 Device: Nasal cannula            AROM RLE (degrees)  RLE AROM: WFL  AROM LLE (degrees)  LLE AROM : WFL  Strength RLE  Comment: Globally >3+/5 per ability to bear full weight in  stance  Strength LLE  Comment: Globally >3+/5 per ability to bear full weight in stance              Transfers  Sit to Stand: 2 Person Assistance;Maximum Assistance;Moderate Assistance (max Ax2 from toilet to stedy, mod Ax2 from recliner to RW)  Stand to Sit: 2 Person Assistance;Moderate Assistance  Bed to Chair: Dependent/Total (toilet>recliner dep via stedy)        Balance  Posture: Fair  Sitting - Static: Fair;Poor  Sitting - Dynamic: Fair;Poor  Standing - Static: Fair  Standing - Dynamic: Fair  Comments: Pt standing in stedy 2x1 min; and in RW x1 min performing weight shifts and minimal marches with min Ax2         AM-PAC Score  AM-PAC Inpatient Mobility Raw Score : 6 (09/23/21 1249)  AM-PAC Inpatient T-Scale Score : 23.55 (09/23/21 1249)  Mobility Inpatient CMS 0-100% Score: 100 (09/23/21 1249)  Mobility Inpatient CMS G-Code Modifier : CN (09/23/21 1249)        Goals  Short Term Goals  Time Frame for Short Term Goals: d/c  Short Term Goal 1: sup<>sit mod Ax1  Short Term Goal 2: sit<>stand mod Ax2  Short Term Goal 3: bed <> chair mod Ax2 with RW  Patient Goals   Patient Goals : none stated       Education  Patient Education  Education Given To: Patient  Education Provided: Role of Therapy  Education Method: Demonstration  Barriers to Learning: None  Education Outcome: Verbalized understanding      Therapy Time   Individual Concurrent Group Co-treatment   Time In 1103         Time Out 1133         Minutes 30         Timed Code Treatment Minutes:  30    Total Treatment Minutes: 30     If the patient is discharged before the next treatment session, this note will serve as the discharge summary.     Domingo Mend, PT, DPT

## 2021-09-26 NOTE — Care Coordination-Inpatient (Signed)
CM following: CM has spoken with pt's family and they have selected [facility] for discharge destination. Family is aware pt will be in the memory care unit at [facility] with skilled services. CM left VM for Patt[facility] Boss [phone] with MPL requesting she begin precert in the morning for the pt. CM will continue to follow for discharge planning.  Electronically signed by Lin Givens, MSW on 09/26/2021 at 5:03 PM  [phone]

## 2021-09-26 NOTE — Progress Notes (Signed)
Pt assisted from chair to bathroom x3 with stedy to attempt to void.  Pt was able to urinate.  Post void bladder scan read .  Family wants to hold off on placing foley catheter at this time.  Resident made aware via perfectserve.

## 2021-09-26 NOTE — Progress Notes (Signed)
Family member present in room made aware of foley catheter order, she wanted to speak with pt daughter before placement.  Daughter concerned due to pt previously not tolerating straight cath few days ago.  Family educated, they requested that this RN reach out to doctor to verify it is necessary.  MD responded with reasons for necessity being retention issues that likely will not resolve until pt mobility improves. Family made aware of MD's reasoning.  MD offered to come speak with family but family member said to hold off for now and that she would speak with daughter.  Will give them time to talk over at this time.

## 2021-09-26 NOTE — Plan of Care (Signed)
Problem: Pain  Goal: Verbalizes/displays adequate comfort level or baseline comfort level  09/26/2021 1036 by Ronald Lobo, RN  Outcome: Progressing   Pt endorsing pain to left hip. Being treated with PRN pain medication, rest, and frequent repositioning with pillow support for comfort and pressure relief. Pt reports some relief from pain with above interventions.   Problem: Skin/Tissue Integrity  Goal: Absence of new skin breakdown  Description: 1.  Monitor for areas of redness and/or skin breakdown  2.  Assess vascular access sites hourly  3.  Every 4-6 hours minimum:  Change oxygen saturation probe site  4.  Every 4-6 hours:  If on nasal continuous positive airway pressure, respiratory therapy assess nares and determine need for appliance change or resting period.  09/26/2021 1036 by Ronald Lobo, RN  Outcome: Progressing   Patient turned every two hours. No new skin breakdown this shift.

## 2021-09-26 NOTE — Discharge Instructions (Addendum)
Continuity of Care Form    Patient Name: Leslie Bradley   DOB:  02-06-1928  MRN:  [id number]    Admit date:  09/21/2021  Discharge date:  ***    Code Status Order: DNR-CCA   Advance Directives:     Admitting Physician:  Virgilio Belling, MD  PCP: No primary care provider on file.    Discharging Nurse: [facility] Unit/Room#: 5511/5511-01  Discharging Unit Phone Number: ***    Emergency Contact:   Extended Emergency Contact Information  Primary Emergency Contact: Ashby Dawes  Home Phone: [phone]  Mobile Phone: [phone]  Relation: Child  Interpreter needed? No    Past Surgical History:  Past Surgical History:   Procedure Laterality Date    BREAST LUMPECTOMY      HIP SURGERY Left 09/22/2021    LEFT HIP HEMIARTHROPLASTY ANTERIOR APPROACH performed by Nunzio Cobbs, MD at [facility] OR       Immunization History:   Immunization History   Administered Date(s) Administered    COVID-19, MODERNA Bivalent, (age 12y+), IM, 50 mcg/0.5 mL 12/30/2020    Influenza, High Dose (Fluzone 65 yrs and older) 10/21/2014    Influenza, Triv, inactivated, subunit, adjuvanted, IM (Fluad 65 yrs and older) 10/05/2017    TDaP, ADACEL (age 12y-64y), Leda Min (age 10y+), IM, 0.13mL 10/06/2010       Active Problems:  Patient Active Problem List   Diagnosis Code    Essential hypertension I10    Anxiety F41.9    Adjustment disorder with mixed anxiety and depressed mood F43.23    Altered mental status R41.82    Acute encephalopathy G93.40    Acute UTI (urinary tract infection) N39.0    Subcapital fracture of femur, left, closed, initial encounter (HCC) S72.012A    Closed fracture of left hip (HCC) S72.002A       Isolation/Infection:   Isolation            No Isolation          Patient Infection Status       None to display            Nurse Assessment:  Last Vital Signs: BP (!) 154/78   Pulse 88   Temp 97.3 F (36.3 C) (Temporal)   Resp 18   Ht 5\' 10"  (1.778 m)   Wt 221 lb 3.2 oz (100.3 kg)   SpO2 92%   BMI 31.74 kg/m     Last  documented pain score (0-10 scale): Pain Level: 0  Last Weight:   Wt Readings from Last 1 Encounters:   09/21/21 221 lb 3.2 oz (100.3 kg)     Mental Status:  {IP PT MENTAL STATUS:20030}    IV Access:  {MH COC IV ACCESS:304088262}    Nursing Mobility/ADLs:  Walking   {CHP DME YTKZ:601093235}  Transfer  {CHP DME TDDU:202542706}  Bathing  {CHP DME CBJS:283151761}  Dressing  {CHP DME YWVP:710626948}  Toileting  {CHP DME NIOE:703500938}  Feeding  {CHP DME HWEX:937169678}  Med Admin  {CHP DME LFYB:017510258}  Med Delivery   {MH COC MED Delivery:304088264}    Wound Care Documentation and Therapy:  Incision 09/22/21 Hip Left (Active)   Dressing Status Clean;Dry;Intact 09/26/21 0715   Incision Cleansed Cleansed with saline 09/25/21 1842   Dressing/Treatment Foam impregnated with Ag;Skin glue 09/26/21 0715   Closure Other (Comment) 09/26/21 0715   Margins Approximated 09/25/21 1842   Drainage Amount None (dry) 09/26/21 0715   Number of days: 3  Elimination:  Continence:   Bowel: {YES / UJ:81191}  Bladder: {YES / YN:82956}  Urinary Catheter: {Urinary Catheter:304088013}   Colostomy/Ileostomy/Ileal Conduit: {YES / OZ:30865}       Date of Last BM: ***    Intake/Output Summary (Last 24 hours) at 09/26/2021 1046  Last data filed at 09/25/2021 2250  Gross per 24 hour   Intake 600 ml   Output 225 ml   Net 375 ml     I/O last 3 completed shifts:  In: 720 [P.O.:720]  Out: 825 [Urine:825]    Safety Concerns:     {MH COC Safety Concerns:304088272}    Impairments/Disabilities:      {MH COC Impairments/Disabilities:304088273}    Nutrition Therapy:  Current Nutrition Therapy:   {MH COC Diet List:304088271}    Routes of Feeding: {CHP DME Other Feedings:304088042}  Liquids: {Slp liquid thickness:30034}  Daily Fluid Restriction: {CHP DME Yes amt example:304088041}  Last Modified Barium Swallow with Video (Video Swallowing Test): {Done Not Done HQIO:962952841}    Treatments at the Time of Hospital Discharge:   Respiratory Treatments:  ***  Oxygen Therapy:  {Therapy; copd oxygen:17808}  Ventilator:    {MH CC Vent LKGM:010272536}    Rehab Therapies: {THERAPEUTIC INTERVENTION:519-505-4301}  Weight Bearing Status/Restrictions: {MH CC Weight Bearing:304508812}  Other Medical Equipment (for information only, NOT a DME order):  {EQUIPMENT:304520077}  Other Treatments: ***    Patient's personal belongings (please select all that are sent with patient):  {CHP DME Belongings:304088044}    RN SIGNATURE:  {Esignature:304088025}    CASE MANAGEMENT/SOCIAL WORK SECTION    Inpatient Status Date: ***    Readmission Risk Assessment Score:  Readmission Risk              Risk of Unplanned Readmission:  13           Discharging to Facility/ Agency   Name:   Address:  Phone:  Fax:    Dialysis Facility (if applicable)   Name:  Address:  Dialysis Schedule:  Phone:  Fax:    Case Manager/Social Worker signature: {Esignature:304088025}    PHYSICIAN SECTION    Prognosis: Fair    Condition at Discharge: Stable    Rehab Potential (if transferring to Rehab): Fair    Recommended Labs or Other Treatments After Discharge:  PT/OT    Physician Certification: I certify the above information and transfer of Leslie Bradley  is necessary for the continuing treatment of the diagnosis listed and that she requires Skilled Nursing Facility for possibly greater than or less than 30 days.     Update Admission H&P: No change in H&P    PHYSICIAN SIGNATURE:  Electronically signed by [place] Willam Munford, MD on 09/26/21 at 10:47 AM EDT

## 2021-09-26 NOTE — Progress Notes (Signed)
Occupational Therapy  Daily Treatment Note  Patient Name: Leslie Bradley  MRN: [id number]    Assessment: Pt has dementia and is still very confused, but pleasant and cooperative. She is able to transfer via [place] and stand at walker and weight shift; not yet able to ambulate. Pt requires assist and cueing for all ADLs. Recommend continued inpt OT/PT at d/c to improve independence before returning to memory care.       Discharge Recommendations: Nayali Talerico scored a 12/24 on the AM-PAC ADL Inpatient form. Current research shows that an AM-PAC score of 17 or less is typically not associated with a discharge to the patient's home setting. Based on the patient's AM-PAC score and their current ADL deficits, it is recommended that the patient have 3-5 sessions per week of Occupational Therapy at d/c to increase the patient's independence.  Please see assessment section for further patient specific details.    If patient discharges prior to next session this note will serve as a discharge summary.  Please see below for the latest assessment towards goals.     Equipment Needs:  No    Chart Reviewed: Yes     Other position/activity restrictions: WBAT, ambulate, ant hip precautions   Additional Pertinent Hx: 88 y/o pt s/p LEFT HIP HEMIARTHROPLASTY ANTERIOR APPROACH.    Treatment Diagnosis: Decreased activity tolerance, impaired ADLs and mobility    Subjective: Pt in bathroom via [name] with RNs on arrival. Pleasantly confused, cooperative. Relative visiting, whom pt recognized.     Pain: Yes, L hip with weight bearing, movement; ice packs applied at end of session, pt appears comfortable at rest    Objective:    Cognition/Orientation: Impaired - baseline dementia; disoriented, word salad, follows simple commands with increased time, keeps eyes closed often    Functional Mobility   Sit to Stand: Max assist x2, mod assist (BSC to [name]); Mod assist x2, mod cues (chair to walker); CGA Clarise Cruz Stedy seat to  stand x2)  Stand to Sit: Mod assist x2, mod cues   Bed to Chair Transfer: mod to max assist x2, mod cues via [name]    Commode Transfer: Max assist x2 via [name] (BSC over toilet)  Static stance: CGA within [name] 1 min + 1 min (leans, surprisingly, onto L, cues to shift weight off of painful LE more toward midline)  Weight shifting: min assist x2 at walker, mod cues 1 min     ADLs   Grooming: SBA, mod cues, set up (oral hygiene in chair, wash face)  Toileting: Dependent (unable to void - OT provided pericare and pulled brief up)    BUE strength  Grip 5/5   Elbow flex/ext 5/5  Shoulder flex ROM WFL    Activity Tolerance: Fair - fatigued, a bit SOB at times. spO2 at rest after activity 90%, up to 93% after use of IS (with cues). BP 150s/70s (similar to recently charted vitals)    Patient Education: orientation, role of OT, transfers - no learning     Safety Devices in Place: left in chair, reclined, ice to hip, alarm on, needs in reach, visitor in room, RN aware      Goals:  Short Term Goals  Time Frame for Short Term Goals: by D/C  Short Term Goal 1: wash face and brush teeth spvn, set up in sitting - Not met  Short Term Goal 2: Participate in sit to stand to progress mobility - Met 8/28; Stand step  transfer min assist - Not met  Short Term Goal 3: Self-feeding mod I, set up - Met 8/28  Short Term Goal 4: Maintain static stance 3 min with SBA - Not met         Plan:      Times Per Week: 5-7x   Times Per Day: Once a day    If patient is discharged prior to next treatment, this note will serve as the discharge summary.    Therapy Time   Individual Concurrent Group Co-treatment   Time In 1059         Time Out 1152         Minutes 53          Timed Code Treatment Minutes: 53  Total Treatment Time: [place], OT

## 2021-09-26 NOTE — Progress Notes (Signed)
Patient is alert and oriented to self only. Disoriented to place, time and situation. Vital signs are stable. Patient denies pain. Dressing is clean dry, and intact. Patient unable to void during shift. Patient bladder scanned for 497 ml and straight cathed for 500 ml per MD order. Bed is in the lowest position. Bed alarm is activated. Call light is within reach. Will continue to monitor and reassess.

## 2021-09-26 NOTE — Progress Notes (Signed)
Pt currently up in chair after working with therapy, pt's family member at bedside said it is okay to place foley once pt returns to bed.

## 2021-09-26 NOTE — Plan of Care (Signed)
Problem: ABCDS Injury Assessment  Goal: Absence of physical injury  Outcome: Progressing     Problem: Skin/Tissue Integrity  Goal: Absence of new skin breakdown  Description: 1.  Monitor for areas of redness and/or skin breakdown  2.  Assess vascular access sites hourly  3.  Every 4-6 hours minimum:  Change oxygen saturation probe site  4.  Every 4-6 hours:  If on nasal continuous positive airway pressure, respiratory therapy assess nares and determine need for appliance change or resting period.  Outcome: Progressing     Problem: Pain  Goal: Verbalizes/displays adequate comfort level or baseline comfort level  Outcome: Progressing     Problem: Safety - Adult  Goal: Free from fall injury  Outcome: Progressing     Problem: Neurosensory - Adult  Goal: Achieves stable or improved neurological status  Outcome: Progressing

## 2021-09-26 NOTE — Progress Notes (Signed)
Progress Note    Admit Date: 09/21/2021  Day: 3  Diet: ADULT DIET; Regular    CC: Fall and hip pain    Interval history: Leslie Bradley is well this morning. She remains pleasantly confused, only oriented to self. She required straight cath once overnight due to retention of 497 mL. She denies HA, chest pain, SOB this morning.      HPI:     Leslie Bradley is a 88 y.o. female with pmhx dementia and HTN who presents to the emergency department from assisted living with left hip pain after fall. Patient unable to provide history d/t dementia, facility reports they are concerned she tripped and fell, the fall was not witnessed. Staff alerted by patient screaming and found patient on the ground holding onto someone's legs. Patient not on any anticoagulation.      On arrival to ED patient afebrile, hypertensive, regular rate satting well on RA. On initial evaluation by ED, patient complaining of L hip pain, L leg shorter compared right. XR showed impacted L femoral neck fracture with superior migration. Seen by Ortho, patient has displaced left hip subcapital femur fracture, plan to operate tomorrow if patient medically stable. Per daughter patient is at baseline mental status. Patient admitted for further management.    Medications:     Scheduled Meds:   tamsulosin  0.4 mg Oral Daily    polyethylene glycol  17 g Oral Daily    senna  1 tablet Oral Daily    aspirin  81 mg Oral BID    methocarbamol  500 mg Oral TID    acetaminophen  1,000 mg Oral 3 times per day    famotidine  20 mg Oral Daily    mirtazapine  15 mg Oral Nightly    amLODIPine  5 mg Oral Daily    furosemide  20 mg Oral Daily    venlafaxine  100 mg Oral BID WC    sodium chloride flush  5-40 mL IntraVENous 2 times per day     Continuous Infusions:   sodium chloride      dextrose       PRN Meds:HYDROmorphone, oxyCODONE **OR** oxyCODONE, melatonin, sodium chloride flush, sodium chloride, ondansetron **OR** ondansetron, glucose, dextrose bolus **OR** dextrose bolus,  glucagon (rDNA), dextrose    Objective:   Vitals:   T-max:  Patient Vitals for the past 8 hrs:   BP Temp Temp src Pulse Resp SpO2   09/26/21 0545 (!) 154/78 97.3 F (36.3 C) Temporal 88 18 92 %   09/26/21 0315 135/84 97.7 F (36.5 C) Oral 87 18 93 %   09/25/21 2250 135/74 98.2 F (36.8 C) Oral 89 18 96 %       Intake/Output Summary (Last 24 hours) at 09/26/2021 4010  Last data filed at 09/25/2021 2250  Gross per 24 hour   Intake 720 ml   Output 225 ml   Net 495 ml     Review of systems  A 10 point ROS was conducted with pertinent positives and negatives listed as above    Physical Exam  Constitutional:       General: She is not in acute distress.     Appearance: She is obese.   HENT:      Head: Normocephalic.      Mouth/Throat:      Mouth: Mucous membranes are moist.   Cardiovascular:      Rate and Rhythm: Normal rate and regular rhythm.      Pulses: Normal  pulses.   Pulmonary:      Effort: Pulmonary effort is normal.      Breath sounds: Lung sounds clear to auscultation  Abdominal:      General: Bowel sounds are normal.      Palpations: Abdomen is soft.   Musculoskeletal:      Cervical back: Normal range of motion.      Comments: L hip with dressing  Skin:     General: Skin is warm and dry.   Neurological:      Mental Status: She is disoriented. Waxing and waning mental status at baseline     Comments: Oriented to self only (with maiden name Leslie Bradley not married name), per daughter this is her baseline      LABS:    CBC:   Recent Labs     09/23/21  0742 09/24/21  0929 09/25/21  0753   WBC 9.5 10.1 7.7   HGB 10.6* 10.3* 9.5*   HCT 31.7* 30.4* 28.9*   PLT 204 181 168   MCV 94.8 95.7 97.4     Renal:    Recent Labs     09/23/21  0742 09/24/21  0929 09/25/21  0753   NA 139 139 139   K 4.3 4.2 4.3   CL 104 103 105   CO2 27 31 27    BUN 18 16 17    CREATININE 1.1 1.0 0.9   GLUCOSE 108* 144* 147*   CALCIUM 8.3 8.3 8.2*   MG 2.00 2.00 2.10   ANIONGAP 8 5 7      Hepatic: No results for input(s): AST, ALT, BILITOT, BILIDIR, PROT,  LABALBU, ALKPHOS in the last 72 hours.  Troponin: No results for input(s): TROPONINI in the last 72 hours.  BNP: No results for input(s): BNP in the last 72 hours.  Lipids: No results for input(s): CHOL, HDL in the last 72 hours.    Invalid input(s): LDLCALCU, TRIGLYCERIDE  ABGs:  No results for input(s): PHART, PCO2ART, PO2ART, HCO3ART, BEART, THGBART, O2SATART, TCO2ART in the last 72 hours.    INR: No results for input(s): INR in the last 72 hours.  Lactate: No results for input(s): LACTATE in the last 72 hours.  Cultures:  -----------------------------------------------------------------  RAD:   XR CHEST PORTABLE   Final Result      1. No acute disease.      11:34 a.m.      XR PELVIS (1-2 VIEWS)   Final Result      Postsurgical changes of left total hip arthroplasty without evidence of   immediate complication.      XR HIP 2-3 VW W PELVIS LEFT   Final Result   Operative fluoroscopy, as above.      FLUORO FOR SURGICAL PROCEDURES   Final Result   Operative fluoroscopy, as above.      XR CHEST PORTABLE   Final Result      Femoral neck acute transverse oblique fracture, likely subcapital in position      Mid to distal femur remains intact including the knee joint.      SINGLE AP VIEW OF THE PELVIS       HISTORY: Left hip and pelvic pain.      PROCEDURE: Single AP supine view of the pelvis was obtained      FINDINGS:      Impacted left femoral neck fracture is noted with superior migration of the   greater trochanter. Right femoral head and neck and proximal hip appear intact.  The remainder the pelvis is intact. There are no unusual calcifications.      IMPRESSION:      Impacted left femoral neck fracture with superior migration, likely subcapital   in position, transverse or oblique.      CHEST PORTABLE VIEW      HISTORY: Larey Seat, hip fracture, preoperative   COMPARISON: February 08, 2018      PROCEDURE: AP portable semierect upright view of the chest was obtained at 1435   hours      FINDINGS:       LINES: No  central lines or tube      LUNGS: Lungs appear grossly clear      HEART: Heart size borderline. Atherosclerotic change of aorta      There is no evidence of pneumothorax      IMPRESSION:      1. No acute process or consolidation   2. Atherosclerotic aorta.               XR FEMUR LEFT (MIN 2 VIEWS)   Final Result      Femoral neck acute transverse oblique fracture, likely subcapital in position      Mid to distal femur remains intact including the knee joint.      SINGLE AP VIEW OF THE PELVIS       HISTORY: Left hip and pelvic pain.      PROCEDURE: Single AP supine view of the pelvis was obtained      FINDINGS:      Impacted left femoral neck fracture is noted with superior migration of the   greater trochanter. Right femoral head and neck and proximal hip appear intact.      The remainder the pelvis is intact. There are no unusual calcifications.      IMPRESSION:      Impacted left femoral neck fracture with superior migration, likely subcapital   in position, transverse or oblique.      CHEST PORTABLE VIEW      HISTORY: Larey Seat, hip fracture, preoperative   COMPARISON: February 08, 2018      PROCEDURE: AP portable semierect upright view of the chest was obtained at 1435   hours      FINDINGS:       LINES: No central lines or tube      LUNGS: Lungs appear grossly clear      HEART: Heart size borderline. Atherosclerotic change of aorta      There is no evidence of pneumothorax      IMPRESSION:      1. No acute process or consolidation   2. Atherosclerotic aorta.               XR PELVIS (1-2 VIEWS)   Final Result      Femoral neck acute transverse oblique fracture, likely subcapital in position      Mid to distal femur remains intact including the knee joint.      SINGLE AP VIEW OF THE PELVIS       HISTORY: Left hip and pelvic pain.      PROCEDURE: Single AP supine view of the pelvis was obtained      FINDINGS:      Impacted left femoral neck fracture is noted with superior migration of the   greater trochanter. Right  femoral head and neck and proximal hip appear intact.      The remainder the pelvis is intact. There are no unusual calcifications.  IMPRESSION:      Impacted left femoral neck fracture with superior migration, likely subcapital   in position, transverse or oblique.      CHEST PORTABLE VIEW      HISTORY: Larey Seat, hip fracture, preoperative   COMPARISON: February 08, 2018      PROCEDURE: AP portable semierect upright view of the chest was obtained at 1435   hours      FINDINGS:       LINES: No central lines or tube      LUNGS: Lungs appear grossly clear      HEART: Heart size borderline. Atherosclerotic change of aorta      There is no evidence of pneumothorax      IMPRESSION:      1. No acute process or consolidation   2. Atherosclerotic aorta.                   Assessment/Plan:     Displaced L Hip Subcapital Femur Fracture   Seen by ortho in ED, had left hip hemiarthroplasty on 8/24 with Dr. Ricki Miller. Tolerated well  - Orthopedic surgery consulted; appreciate recs   - left hip hemiarthroplasty on 8/24   - PT/OT consulted, scores 6/7 respectively   - Weight bearing as tolerated   - ASA 81 mg BID for 4 weeks   - F/u in 4 weeks   - Pain regimen:   - Oxycodone 5 mg q4h prn   - Dilaudid 0.25 mg IV q4h prn for breakthrough pain   - Robaxin 500 mg TID     Increased O2 requirements  Suspect it is 2/2 to pt pulling lines and not having NC in. Pt also likely has component of sleep apnea as saturations worsen when snoring. Little suspicion for PE at this time given no associated tachycardia, chest pain, or report of SOB. CXR negative for infection.  - Continue oxygen supplementation as needed  - Encouraged incentive spirometry    Limited urine output, improved  Improving urine output. Suspect initial reduced urinary output primarily due to low volume status compiled with physiologic ADH secretion from stress. Creatinine is stable suggesting no signs of kidney damage at this time. Was retaining overnight, likely due to lack  of ambulation.  - Placed foley catheter, will f/u with urology 1 week after discharge for reassessment  - Continue flomax     Chronic Medical Conditions     HTN   - Continue home amlodipine 5 mg   - Continue home lasix 20 mg      Anxiety   - Continue home venlafaxine 100 mg BID     Code Status: DNR-CCA  FEN: Adult diet; regular  PPX: pepcid, ASA 81 BID  DISPO: GGMF    [place] Royce Stegman, MD, PGY-1  09/26/21  6:25 AM    This patient has been staffed and discussed with Virgilio Belling, MD.

## 2021-09-26 NOTE — Discharge Instructions (Addendum)
Please follow-up with Dr. Ricki Miller in orthopedic surgery in 4 weeks. Continue taking Aspirin 81mg  daily for 4 weeks. Your amlodipine dose you should be taking now is 5mg  once a day.    Please follow-up with your PCP in 1 week.

## 2021-09-27 LAB — BASIC METABOLIC PANEL W/ REFLEX TO MG FOR LOW K
Anion Gap: 7 (ref 3–16)
BUN: 12 mg/dL (ref 7–20)
CO2: 30 mmol/L (ref 21–32)
Calcium: 8.5 mg/dL (ref 8.3–10.6)
Chloride: 102 mmol/L (ref 99–110)
Creatinine: 0.8 mg/dL (ref 0.6–1.2)
Est, Glom Filt Rate: >60 (ref >60)
Glucose: 102 mg/dL — ABNORMAL HIGH (ref 70–99)
Potassium reflex Magnesium: 3.8 mmol/L (ref 3.5–5.1)
Sodium: 139 mmol/L (ref 136–145)

## 2021-09-27 LAB — CBC WITH AUTO DIFFERENTIAL
Basophils %: 0.5 %
Basophils Absolute: 0 10*3/uL (ref 0.0–0.2)
Eosinophils %: 5.5 %
Eosinophils Absolute: 0.3 10*3/uL (ref 0.0–0.6)
Hematocrit: 29 % — ABNORMAL LOW (ref 36.0–48.0)
Hemoglobin: 9.8 g/dL — ABNORMAL LOW (ref 12.0–16.0)
Lymphocytes %: 23.9 %
Lymphocytes Absolute: 1.3 10*3/uL (ref 1.0–5.1)
MCH: 32.3 pg (ref 26.0–34.0)
MCHC: 33.7 g/dL (ref 31.0–36.0)
MCV: 95.8 fL (ref 80.0–100.0)
MPV: 8.5 fL (ref 5.0–10.5)
Monocytes %: 13.1 %
Monocytes Absolute: 0.7 10*3/uL (ref 0.0–1.3)
Neutrophils %: 57 %
Neutrophils Absolute: 3 10*3/uL (ref 1.7–7.7)
Platelets: 206 10*3/uL (ref 135–450)
RBC: 3.02 M/uL — ABNORMAL LOW (ref 4.00–5.20)
RDW: 13.8 % (ref 12.4–15.4)
WBC: 5.3 10*3/uL (ref 4.0–11.0)

## 2021-09-27 LAB — MAGNESIUM: Magnesium: 2.1 mg/dL (ref 1.80–2.40)

## 2021-09-27 MED FILL — FUROSEMIDE 20 MG PO TABS: 20 MG | ORAL | Qty: 1

## 2021-09-27 MED FILL — TAMSULOSIN HCL 0.4 MG PO CAPS: 0.4 MG | ORAL | Qty: 1

## 2021-09-27 MED FILL — ACETAMINOPHEN EXTRA STRENGTH 500 MG PO TABS: 500 MG | ORAL | Qty: 2

## 2021-09-27 MED FILL — METHOCARBAMOL 500 MG PO TABS: 500 MG | ORAL | Qty: 1

## 2021-09-27 MED FILL — ASPIRIN LOW DOSE 81 MG PO CHEW: 81 MG | ORAL | Qty: 1

## 2021-09-27 MED FILL — AMLODIPINE BESYLATE 5 MG PO TABS: 5 MG | ORAL | Qty: 1

## 2021-09-27 MED FILL — FAMOTIDINE 20 MG PO TABS: 20 MG | ORAL | Qty: 1

## 2021-09-27 MED FILL — OXYCODONE HCL 5 MG PO TABS: 5 MG | ORAL | Qty: 1

## 2021-09-27 MED FILL — VENLAFAXINE HCL 25 MG PO TABS: 25 MG | ORAL | Qty: 1

## 2021-09-27 MED FILL — SENNA-LAX 8.6 MG PO TABS: 8.6 MG | ORAL | Qty: 1

## 2021-09-27 MED FILL — MIRTAZAPINE 15 MG PO TABS: 15 MG | ORAL | Qty: 1

## 2021-09-27 NOTE — Progress Notes (Signed)
Occupational Therapy  Occupational Therapy  Daily Treatment Note  Patient Name: Leslie Bradley  MRN: [id number]    Chart Reviewed: Yes       Other position/activity restrictions: WBAT, ambulate, ant hip precautions     Additional Pertinent Hx: 88 y/o pt s/p LEFT HIP HEMIARTHROPLASTY ANTERIOR APPROACH.         Treatment Diagnosis: Decreased activity tolerance, impaired ADLs and mobility    Subjective: Pt supine in bed, HOB raised and pt finished eating breakfast. Family friend, Leslie Bradley, present. Pt is pleasantly confused, reoriented throughout therapy session. Pt agreeable to therapy session.  General Comments: Pt supine to sit Max A x 2. Pt saturated in urine, sit to stand Mod A x 2 to Leslie Bradley and transferred to raised toilet with rails. Pt completed below ADLs in bathroom. Pt seated in chair, sit to stand Min A x 2 to rw and ambulated Min a ~5f with rw and vcs  x2 trials. At EOS call light in reach and chair alarm on. Ice pack LLE.    Pain: Denies, pt with some soreness during activity (faces) ice pack applied EOS    Social/Functional History  [facility]  ADL Assistance: Needs assistance (set up, cues, min assist)  Ambulation Assistance: Independent  Prior Function  ADL Assistance: Needs assistance (set up, cues, min assist)  Ambulation Assistance: Independent    Objective:    Cognition/Orientation:Impaired - baseline dementia; oriented to person only, word salad, follows simple commands with increased time, more alert this date and talkative, singing childhood school songs, no new learning    Bed mobility   Supine to sit: Max A x 2 with HOB raised  Scooting: Max A to EOB    Functional Mobility   Sit to Stand: Mod A x 2 SDenna [place] M[place] x 2 chair to rw  Stand to Sit: Min A x 2 from SSan Marinoand MMarsA rw to chair  Bed to Chair Transfer: Min A to ambulate chair to chair, use of SDenna Haggardbed to toilet  Commode Transfer: Dependent SDenna HaggardMod A x 2    ADLs   Grooming:  CGA in SSan Marinostance for oral care/wash face, pt seated in SSan Marinoto comb hair  Bathing: UB seated on toilet SBA/vcs/setup  UB dressing: gown change  LB dressing: Dependent brief  Toileting: Dependent - pt didn't void on toilet but brief was saturated - Dependent for hygiene care and brief mgmt    UE Exercises N/A    Activity Tolerance: Good      Patient Education: transfer training, orientation, role of OT, precautions - no new lInsurance risk surveyorDevices in Place: chair alarm on and call light in reach    Assessment: Pt progressing with therapy, ambulating with rw ~541fx 2 trials at [place]. Pt transferring with SaDenna Haggardod A x 2 progressing to Min A x 2 from chair to rw. Pt tolerating CGA standing is SaDenna Haggardo wash face and complete oral care. Pt did require Dependent assist for toileting and LB dressing. Recommend continued inpt therapy at d/c to maximize functional independence and safety. Continue with POC.      Discharge Recommendations: MaSkylarr Bradley a 12/24 on the AM-PAC ADL Inpatient form. Current research shows that an AM-PAC score of 17 or less is typically not associated with a discharge to the patient's home setting. Based on the patient's AM-PAC score and their  current ADL deficits, it is recommended that the patient have 3-5 sessions per week of Occupational Therapy at d/c to increase the patient's independence.  Please see assessment section for further patient specific details.    If patient discharges prior to next session this note will serve as a discharge summary.  Please see below for the latest assessment towards goals.    Equipment Needs:  Defer to next setting     Therapy Time:   Individual Concurrent Group Co-treatment   Time In 0922         Time Out 1016         Minutes 54         Timed Code Treatment Minutes: 54 min   Total Treatment Minutes:  54 min       Goals:  Short Term Goals  Time Frame for Short Term Goals: by D/C  Short Term Goal 1: wash face and brush teeth  spvn, set up in sitting - Not met  Short Term Goal 2: Participate in sit to stand to progress mobility - Met 8/28; Stand step transfer min assist - goal met 8/29 UPDATE GOAL - Pt will ambulate to/from bathroom CGA  Short Term Goal 3: Self-feeding mod I, set up - Met 8/28  Short Term Goal 4: Maintain static stance 3 min with SBA - Not met         Plan:      Times Per Week: 5-7x   Times Per Day: Once a day    If patient is discharged prior to next treatment, this note will serve as the discharge summary.    Leslie Bradley, [place]

## 2021-09-27 NOTE — Progress Notes (Signed)
Pt alert and oriented to self only but disoriented to time, place and person so re- orientation has been given. No any acute changes overnight. VS were taken and recorded. Spo2 maintained at RA. Is on self void and tolerating well. Pt up to the toilet with assist X2 using GB & stedy and able to void. No bowel movement during this shift. Pt pull out her IV catheter so cannulization done in L posterior hand with 24G. Incision site is clean, dry & intact, no any drainage noted. All standard safety precautions in place, call light within reach. Will continue to monitor and re- assess.

## 2021-09-27 NOTE — Care Coordination-Inpatient (Signed)
Spoke with patients daughter via phone for Imm form.

## 2021-09-27 NOTE — Plan of Care (Signed)
Problem: ABCDS Injury Assessment  Goal: Absence of physical injury  09/27/2021 1420 by Rae Halsted, RN  Outcome: Progressing  Flowsheets (Taken 09/27/2021 0042 by Jerrilyn Cairo, RN)  Absence of Physical Injury: Implement safety measures based on patient assessment  09/27/2021 0045 by Jerrilyn Cairo, RN  Outcome: Progressing  Flowsheets (Taken 09/27/2021 0042)  Absence of Physical Injury: Implement safety measures based on patient assessment     Problem: Skin/Tissue Integrity  Goal: Absence of new skin breakdown  Description: 1.  Monitor for areas of redness and/or skin breakdown  2.  Assess vascular access sites hourly  3.  Every 4-6 hours minimum:  Change oxygen saturation probe site  4.  Every 4-6 hours:  If on nasal continuous positive airway pressure, respiratory therapy assess nares and determine need for appliance change or resting period.  09/27/2021 1420 by Rae Halsted, RN  Outcome: Progressing  09/27/2021 0045 by Jerrilyn Cairo, RN  Outcome: Progressing     Problem: Discharge Planning  Goal: Discharge to home or other facility with appropriate resources  09/27/2021 1420 by Rae Halsted, RN  Outcome: Progressing  Flowsheets (Taken 09/27/2021 0300 by Jerrilyn Cairo, RN)  Discharge to home or other facility with appropriate resources:   Arrange for needed discharge resources and transportation as appropriate   Identify barriers to discharge with patient and caregiver   Identify discharge learning needs (meds, wound care, etc)  09/27/2021 0045 by Jerrilyn Cairo, RN  Outcome: Progressing  Flowsheets  Taken 09/26/2021 2300  Discharge to home or other facility with appropriate resources:   Identify barriers to discharge with patient and caregiver   Arrange for needed discharge resources and transportation as appropriate   Identify discharge learning needs (meds, wound care, etc)  Taken 09/26/2021 1900  Discharge to home or other facility with appropriate resources:   Identify barriers to discharge with patient  and caregiver   Arrange for needed discharge resources and transportation as appropriate   Identify discharge learning needs (meds, wound care, etc)     Problem: Pain  Goal: Verbalizes/displays adequate comfort level or baseline comfort level  09/27/2021 1420 by Rae Halsted, RN  Outcome: Progressing  Flowsheets (Taken 09/27/2021 1420)  Verbalizes/displays adequate comfort level or baseline comfort level:   Consider cultural and social influences on pain and pain management   Implement non-pharmacological measures as appropriate and evaluate response   Administer analgesics based on type and severity of pain and evaluate response   Assess pain using appropriate pain scale   Encourage patient to monitor pain and request assistance  09/27/2021 0045 by Jerrilyn Cairo, RN  Outcome: Progressing     Problem: Safety - Adult  Goal: Free from fall injury  09/27/2021 1420 by Rae Halsted, RN  Outcome: Progressing  Flowsheets (Taken 09/27/2021 0042 by Jerrilyn Cairo, RN)  Free From Fall Injury: Instruct family/caregiver on patient safety  09/27/2021 0045 by Jerrilyn Cairo, RN  Outcome: Progressing  Flowsheets (Taken 09/27/2021 0042)  Free From Fall Injury: Instruct family/caregiver on patient safety     Problem: Neurosensory - Adult  Goal: Achieves stable or improved neurological status  09/27/2021 1420 by Rae Halsted, RN  Outcome: Progressing  Flowsheets (Taken 09/27/2021 1420)  Achieves stable or improved neurological status: Assess for and report changes in neurological status  09/27/2021 0045 by Jerrilyn Cairo, RN  Outcome: Progressing  Flowsheets  Taken 09/26/2021 2300  Achieves stable or improved neurological status: Assess for and report changes in neurological status  Taken 09/26/2021 1900  Achieves stable or  improved neurological status: Assess for and report changes in neurological status  Goal: Achieves maximal functionality and self care  Outcome: Progressing  Flowsheets (Taken 09/27/2021 1420)  Achieves maximal  functionality and self care:   Monitor swallowing and airway patency with patient fatigue and changes in neurological status   Encourage and assist patient to increase activity and self care with guidance from physical therapy/occupational therapy

## 2021-09-27 NOTE — Progress Notes (Signed)
Pt. Alert and oriented to person but disoriented to time, place and situation. VSS on room air. Pt. up to the toilet with assist X 2 using GB & stedy and able to void. Incision site is clean, dry & intact, no any drainage noted. All standard safety precautions in place, call light within reach.

## 2021-09-27 NOTE — Plan of Care (Signed)
Problem: Discharge Planning  Goal: Discharge to home or other facility with appropriate resources  09/27/2021 1944 by Vincenza Hews, RN  Outcome: Progressing  Patient from memory care unit. Case management following plan of care.     Problem: Pain  Goal: Verbalizes/displays adequate comfort level or baseline comfort level  09/27/2021 1944 by Vincenza Hews, RN  Outcome: Progressing   Pt endorsing pain to 2. Being treated with PRN pain medication, rest, and frequent repositioning with pillow support for comfort and pressure relief. Pt reports some relief from pain with above interventions.     Problem: Safety - Adult  Goal: Free from fall injury  09/27/2021 1944 by Vincenza Hews, RN  Outcome: Progressing   All fall precautions in place. Bed locked and in lowest position with alarm on. Overbed table and personal belonings within reach. Call light within reach and patient instructed to use call light for assistance. Non-skid socks on.

## 2021-09-27 NOTE — Care Coordination-Inpatient (Signed)
CM following: CM spoke with Pattricia Boss with [facility] and verified that Pattricia Boss is starting precert this AM for the pt. Pattricia Boss will keep CM updated on status of insurance approval. CM will continue to follow for discharge planning.  Electronically signed by Lin Givens, MSW on 09/27/2021 at 9:18 AM  [phone]

## 2021-09-27 NOTE — Plan of Care (Signed)
Problem: ABCDS Injury Assessment  Goal: Absence of physical injury  Outcome: Progressing  Note: Pt free from physical injury.   Flowsheets (Taken 09/27/2021 0042)  Absence of Physical Injury: Implement safety measures based on patient assessment     Problem: Discharge Planning  Goal: Discharge to home or other facility with appropriate resources  Outcome: Progressing  Note: CM is following for discharge.   Flowsheets  Taken 09/26/2021 2300  Discharge to home or other facility with appropriate resources:   Identify barriers to discharge with patient and caregiver   Arrange for needed discharge resources and transportation as appropriate   Identify discharge learning needs (meds, wound care, etc)  Taken 09/26/2021 1900  Discharge to home or other facility with appropriate resources:   Identify barriers to discharge with patient and caregiver   Arrange for needed discharge resources and transportation as appropriate   Identify discharge learning needs (meds, wound care, etc)     Problem: Pain  Goal: Verbalizes/displays adequate comfort level or baseline comfort level  Outcome: Progressing  Note: Numeric pain rating scale used for assessment and managed as per MAR.     Problem: Safety - Adult  Goal: Free from fall injury  Outcome: Progressing  Note: All standard safety fall precautions in place, call light within reach. No fall injury.   Flowsheets (Taken 09/27/2021 0042)  Free From Fall Injury: Instruct family/caregiver on patient safety     Problem: Neurosensory - Adult  Goal: Achieves stable or improved neurological status  Outcome: Progressing  Flowsheets  Taken 09/26/2021 2300  Achieves stable or improved neurological status: Assess for and report changes in neurological status  Taken 09/26/2021 1900  Achieves stable or improved neurological status: Assess for and report changes in neurological status

## 2021-09-27 NOTE — Progress Notes (Signed)
Physical Therapy  Facility/Department: [facility]  Physical Therapy Treatment    Name: Leslie Bradley  DOB: Nov 05, 1928  MRN: [id number]  Date of Service: 09/27/2021    Discharge Recommendations:    Leslie Bradley scored a 8/24 on the AM-PAC short mobility form. Current research shows that an AM-PAC score of 17 or less is typically not associated with a discharge to the patient's home setting. Based on the patient's AM-PAC score and their current functional mobility deficits, it is recommended that the patient have 3-5 sessions per week of Physical Therapy at d/c to increase the patient's independence.  Please see assessment section for further patient specific details.    If patient discharges prior to next session this note will serve as a discharge summary.  Please see below for the latest assessment towards goals.     PT Equipment Recommendations  Equipment Needed: No (defer to next level of care)      Patient Diagnosis(es): The encounter diagnosis was Closed fracture of left hip, initial encounter ([place]).  Past Medical History:  has a past medical history of Breast cancer ([place]) and Hypertension.  Past Surgical History:  has a past surgical history that includes Breast lumpectomy and hip surgery (Left, 09/22/2021).    Assessment   Body Structures, Functions, Activity Limitations Requiring Skilled Therapeutic Intervention: Decreased functional mobility ;Decreased cognition;Decreased safe awareness;Decreased strength;Decreased balance;Increased pain;Decreased posture  Assessment: Pt requiring Ax2 for bed mobility and transfers and was able to amb 2x5' with RW and min Ax1 this session. Pt making good progress however still remains below her baseline and would benefit from further skilled PT to maximize safety and independence with functional mobility. Will continue to follow.  Treatment Diagnosis: Decreased functional mobility  Barriers to Learning: cognition  Activity Tolerance  Activity Tolerance: Patient  limited by endurance;Patient limited by fatigue;Patient tolerated treatment well     Plan   Physcial Therapy Plan  General Plan: 5-7 times per week  Current Treatment Recommendations: Strengthening, Balance training, Functional mobility training, Transfer training, Gait training, Endurance training, Safety education & training, Patient/Caregiver education & training, Therapeutic activities  Safety Devices  Type of Devices: Call light within reach, Chair alarm in place, Nurse notified, Left in chair, Gait belt     Restrictions  Position Activity Restriction  Other position/activity restrictions: WBAT, ambulate, ant hip precautions     Subjective   General  Chart Reviewed: Yes  Additional Pertinent Hx: 88 y/o pt s/p LEFT HIP HEMIARTHROPLASTY ANTERIOR APPROACH.  Family / Caregiver Present: Yes (Daughters MIL)  Referring Practitioner: Irish Lack, MD  Diagnosis: s/p LEFT HIP HEMIARTHROPLASTY ANTERIOR APPROACH  Follows Commands: Within Functional Limits  Subjective  Subjective: Pt found supine in bed upon arrival, denying pain and agreeable to therapy.         Social/Functional History  Social/Functional History  Type of Home: Facility ([facility])  ADL Assistance: Needs assistance (set up, cues, min assist)  Ambulation Assistance: Independent    Cognition   Orientation  Overall Orientation Status: Impaired  Orientation Level: Oriented to person;Disoriented to place;Disoriented to time;Disoriented to situation  Cognition  Overall Cognitive Status: Exceptions     Objective   Pulse: 92  Heart Rate Source: Monitor  BP: (!) 180/88  BP Location: Right upper arm  BP Method: Automatic  Patient Position: Semi fowlers  MAP (Calculated): 119  Respirations: 16  SpO2: 93 %  O2 Device: None (Room air)       Bed mobility  Rolling to Left: 2  Person assistance;Maximum assistance  Supine to Sit: 2 Person assistance;Maximum assistance  Scooting: Maximal assistance  Transfers  Sit to Stand: 2 Person Assistance;Moderate  Assistance;Minimal Assistance (Mod Ax2 from EOB to stedy and from toilet to stedy; Min Ax2 from recliner to RW and from armed chair to RW.)  Stand to Sit: 2 Person Assistance;Minimal Assistance (VC for hand placement, sequencing and safety)  Bed to Chair: Dependent/Total (EOB>toilet>recliner dep via stedy)  Ambulation  WB Status: FWBAT  Ambulation  Surface: Level tile  Device: Rolling Walker  Assistance: Minimal assistance  Quality of Gait: slow and effortful, VC/TC to stay within steady and for sequencing of steps  Distance: 2x5'     Balance  Posture: Fair  Sitting - Static: Fair  Sitting - Dynamic: Fair  Standing - Static: Fair  Standing - Dynamic: Fair  Exercise Treatment: LAQ x10 BLE        AM-PAC Score  AM-PAC Inpatient Mobility Raw Score : 6 (09/26/21 1328)  AM-PAC Inpatient T-Scale Score : 23.55 (09/26/21 1328)  Mobility Inpatient CMS 0-100% Score: 100 (09/26/21 1328)  Mobility Inpatient CMS G-Code Modifier : CN (09/26/21 1328)        Goals  Short Term Goals  Time Frame for Short Term Goals: d/c  Short Term Goal 1: sup<>sit mod Ax1  Short Term Goal 2: sit<>stand mod Ax2 Goal met; updated goal: sit<>stand min Ax1  Short Term Goal 3: bed <> chair mod Ax2 with RW  Short Term Goal 4: amb 34' with RW and CGA  Patient Goals   Patient Goals : none stated       Education  Patient Education  Education Given To: Patient  Education Provided: Role of Therapy  Education Method: Demonstration  Barriers to Learning: None  Education Outcome: Verbalized understanding      Therapy Time   Individual Concurrent Group Co-treatment   Time In 0922         Time Out 1016         Minutes 54         Timed Code Treatment Minutes:  54    Total Treatment Minutes:  54    If the patient is discharged before the next treatment session, this note will serve as the discharge summary.     Amanda Cockayne, PT, DPT

## 2021-09-27 NOTE — Care Coordination-Inpatient (Signed)
Verbal on phone with patients son for IMM form.

## 2021-09-27 NOTE — Progress Notes (Signed)
Progress Note    Admit Date: 09/21/2021  Day: 3  Diet: ADULT DIET; Regular    CC: Fall and hip pain    Interval history: Leslie Bradley is well this morning. Leslie Bradley is more sleepy this morning, = but easy to wake. Leslie Bradley remains pleasantly confused and only oriented to self. Leslie Bradley was able to spontaneously void twice overnight. Denies leg/hip pain. Denies HA, SOB, chest pain, abdominal pain.      HPI:     Leslie Bradley is a 88 y.o. female with pmhx dementia and HTN who presents to the emergency department from assisted living with left hip pain after fall. Patient unable to provide history d/t dementia, facility reports they are concerned Leslie Bradley tripped and fell, the fall was not witnessed. Staff alerted by patient screaming and found patient on the ground holding onto someone's legs. Patient not on any anticoagulation.      On arrival to ED patient afebrile, hypertensive, regular rate satting well on RA. On initial evaluation by ED, patient complaining of L hip pain, L leg shorter compared right. XR showed impacted L femoral neck fracture with superior migration. Seen by Ortho, patient has displaced left hip subcapital femur fracture, plan to operate tomorrow if patient medically stable. Per daughter patient is at baseline mental status. Patient admitted for further management.    Medications:     Scheduled Meds:   tamsulosin  0.4 mg Oral Daily    polyethylene glycol  17 g Oral Daily    senna  1 tablet Oral Daily    aspirin  81 mg Oral BID    methocarbamol  500 mg Oral TID    acetaminophen  1,000 mg Oral 3 times per day    famotidine  20 mg Oral Daily    mirtazapine  15 mg Oral Nightly    amLODIPine  5 mg Oral Daily    furosemide  20 mg Oral Daily    venlafaxine  100 mg Oral BID WC    sodium chloride flush  5-40 mL IntraVENous 2 times per day     Continuous Infusions:   sodium chloride      dextrose       PRN Meds:HYDROmorphone, oxyCODONE **OR** oxyCODONE, melatonin, sodium chloride flush, sodium chloride, ondansetron **OR**  ondansetron, glucose, dextrose bolus **OR** dextrose bolus, glucagon (rDNA), dextrose    Objective:   Vitals:   T-max:  Patient Vitals for the past 8 hrs:   BP Temp Temp src Pulse Resp SpO2   09/27/21 0700 (!) 150/62 97.6 F (36.4 C) Oral 92 16 --   09/27/21 0322 (!) 140/69 97.6 F (36.4 C) Oral 81 16 93 %       Intake/Output Summary (Last 24 hours) at 09/27/2021 0802  Last data filed at 09/27/2021 0600  Gross per 24 hour   Intake 570 ml   Output 438 ml   Net 132 ml     Review of systems  A 10 point ROS was conducted with pertinent positives and negatives listed as above    Physical Exam  Constitutional:       General: Leslie Bradley is not in acute distress.     Appearance: Leslie Bradley is obese.   HENT:      Head: Normocephalic.      Mouth/Throat:      Mouth: Mucous membranes are moist.   Cardiovascular:      Rate and Rhythm: Normal rate and regular rhythm.      Pulses: Normal pulses.   Pulmonary:  Effort: Pulmonary effort is normal.      Breath sounds: Lung sounds clear to auscultation  Abdominal:      General: Bowel sounds are normal.      Palpations: Abdomen is soft.   Musculoskeletal:      Cervical back: Normal range of motion.      Comments: L hip with dressing  Skin:     General: Skin is warm and dry.   Neurological:      Mental Status: Leslie Bradley is disoriented. Waxing and waning mental status at baseline     Comments: Oriented to self only (with maiden name Leslie Bradley not married name), per daughter this is her baseline      LABS:    CBC:   Recent Labs     09/25/21  0753 09/26/21  0835 09/27/21  0659   WBC 7.7 6.2 5.3   HGB 9.5* 10.1* 9.8*   HCT 28.9* 30.2* 29.0*   PLT 168 191 206   MCV 97.4 94.8 95.8     Renal:    Recent Labs     09/25/21  0753 09/26/21  0835 09/27/21  0659   NA 139 137 139   K 4.3 4.1 3.8   CL 105 101 102   CO2 27 33* 30   BUN 17 15 12    CREATININE 0.9 0.8 0.8   GLUCOSE 147* 100* 102*   CALCIUM 8.2* 8.6 8.5   MG 2.10 2.00 2.10   ANIONGAP 7 3 7      Hepatic: No results for input(s): AST, ALT, BILITOT, BILIDIR, PROT,  LABALBU, ALKPHOS in the last 72 hours.  Troponin: No results for input(s): TROPONINI in the last 72 hours.  BNP: No results for input(s): BNP in the last 72 hours.  Lipids: No results for input(s): CHOL, HDL in the last 72 hours.    Invalid input(s): LDLCALCU, TRIGLYCERIDE  ABGs:  No results for input(s): PHART, PCO2ART, PO2ART, HCO3ART, BEART, THGBART, O2SATART, TCO2ART in the last 72 hours.    INR: No results for input(s): INR in the last 72 hours.  Lactate: No results for input(s): LACTATE in the last 72 hours.  Cultures:  -----------------------------------------------------------------  RAD:   XR CHEST PORTABLE   Final Result      1. No acute disease.      11:34 a.m.      XR PELVIS (1-2 VIEWS)   Final Result      Postsurgical changes of left total hip arthroplasty without evidence of   immediate complication.      XR HIP 2-3 VW W PELVIS LEFT   Final Result   Operative fluoroscopy, as above.      FLUORO FOR SURGICAL PROCEDURES   Final Result   Operative fluoroscopy, as above.      XR CHEST PORTABLE   Final Result      Femoral neck acute transverse oblique fracture, likely subcapital in position      Mid to distal femur remains intact including the knee joint.      SINGLE AP VIEW OF THE PELVIS       HISTORY: Left hip and pelvic pain.      PROCEDURE: Single AP supine view of the pelvis was obtained      FINDINGS:      Impacted left femoral neck fracture is noted with superior migration of the   greater trochanter. Right femoral head and neck and proximal hip appear intact.      The remainder the pelvis is intact.  There are no unusual calcifications.      IMPRESSION:      Impacted left femoral neck fracture with superior migration, likely subcapital   in position, transverse or oblique.      CHEST PORTABLE VIEW      HISTORY: Larey Seat, hip fracture, preoperative   COMPARISON: February 08, 2018      PROCEDURE: AP portable semierect upright view of the chest was obtained at 1435   hours      FINDINGS:       LINES: No  central lines or tube      LUNGS: Lungs appear grossly clear      HEART: Heart size borderline. Atherosclerotic change of aorta      There is no evidence of pneumothorax      IMPRESSION:      1. No acute process or consolidation   2. Atherosclerotic aorta.               XR FEMUR LEFT (MIN 2 VIEWS)   Final Result      Femoral neck acute transverse oblique fracture, likely subcapital in position      Mid to distal femur remains intact including the knee joint.      SINGLE AP VIEW OF THE PELVIS       HISTORY: Left hip and pelvic pain.      PROCEDURE: Single AP supine view of the pelvis was obtained      FINDINGS:      Impacted left femoral neck fracture is noted with superior migration of the   greater trochanter. Right femoral head and neck and proximal hip appear intact.      The remainder the pelvis is intact. There are no unusual calcifications.      IMPRESSION:      Impacted left femoral neck fracture with superior migration, likely subcapital   in position, transverse or oblique.      CHEST PORTABLE VIEW      HISTORY: Larey Seat, hip fracture, preoperative   COMPARISON: February 08, 2018      PROCEDURE: AP portable semierect upright view of the chest was obtained at 1435   hours      FINDINGS:       LINES: No central lines or tube      LUNGS: Lungs appear grossly clear      HEART: Heart size borderline. Atherosclerotic change of aorta      There is no evidence of pneumothorax      IMPRESSION:      1. No acute process or consolidation   2. Atherosclerotic aorta.               XR PELVIS (1-2 VIEWS)   Final Result      Femoral neck acute transverse oblique fracture, likely subcapital in position      Mid to distal femur remains intact including the knee joint.      SINGLE AP VIEW OF THE PELVIS       HISTORY: Left hip and pelvic pain.      PROCEDURE: Single AP supine view of the pelvis was obtained      FINDINGS:      Impacted left femoral neck fracture is noted with superior migration of the   greater trochanter. Right  femoral head and neck and proximal hip appear intact.      The remainder the pelvis is intact. There are no unusual calcifications.      IMPRESSION:  Impacted left femoral neck fracture with superior migration, likely subcapital   in position, transverse or oblique.      CHEST PORTABLE VIEW      HISTORY: Larey Seat, hip fracture, preoperative   COMPARISON: February 08, 2018      PROCEDURE: AP portable semierect upright view of the chest was obtained at 1435   hours      FINDINGS:       LINES: No central lines or tube      LUNGS: Lungs appear grossly clear      HEART: Heart size borderline. Atherosclerotic change of aorta      There is no evidence of pneumothorax      IMPRESSION:      1. No acute process or consolidation   2. Atherosclerotic aorta.                   Assessment/Plan:     Displaced L Hip Subcapital Femur Fracture   Seen by ortho in ED, had left hip hemiarthroplasty on 8/24 with Dr. Ricki Miller. Tolerated well  - Orthopedic surgery consulted; appreciate recs   - left hip hemiarthroplasty on 8/24   - PT/OT consulted, scores 6/7 respectively   - Weight bearing as tolerated   - ASA 81 mg BID for 4 weeks   - F/u in 4 weeks   - Pain regimen:   - Oxycodone 5 mg q4h prn   - Dilaudid 0.25 mg IV q4h prn for breakthrough pain   - Robaxin 500 mg TID     Increased O2 requirements  Suspect it is 2/2 to pt pulling lines and not having NC in. Pt also likely has component of sleep apnea as saturations worsen when snoring. Little suspicion for PE at this time given no associated tachycardia, chest pain, or report of SOB. CXR negative for infection.  - Continue oxygen supplementation as needed  - Encouraged incentive spirometry    Limited urine output, improved  Improving urine output. Suspect initial reduced urinary output primarily due to low volume status compiled with physiologic ADH secretion from stress. Creatinine is stable suggesting no signs of kidney damage at this time. Was retaining overnight, likely due to lack  of ambulation.  - Has had 2x spontaneous voiding, foley not indicated  - Continue flomax     Chronic Medical Conditions     HTN   - Continue home amlodipine 5 mg   - Continue home lasix 20 mg      Anxiety   - Continue home venlafaxine 100 mg BID     Code Status: DNR-CCA  FEN: Adult diet; regular  PPX: pepcid, ASA 81 BID  DISPO: GGMF    [place] Macedonio Scallon, MD, PGY-1  09/27/21  8:02 AM    This patient has been staffed and discussed with Virgilio Belling, MD.

## 2021-09-28 LAB — CBC WITH AUTO DIFFERENTIAL
Basophils %: 0.4 %
Basophils Absolute: 0 10*3/uL (ref 0.0–0.2)
Eosinophils %: 4.6 %
Eosinophils Absolute: 0.2 10*3/uL (ref 0.0–0.6)
Hematocrit: 29.9 % — ABNORMAL LOW (ref 36.0–48.0)
Hemoglobin: 10.1 g/dL — ABNORMAL LOW (ref 12.0–16.0)
Lymphocytes %: 22.9 %
Lymphocytes Absolute: 1.2 10*3/uL (ref 1.0–5.1)
MCH: 31.9 pg (ref 26.0–34.0)
MCHC: 33.9 g/dL (ref 31.0–36.0)
MCV: 94 fL (ref 80.0–100.0)
MPV: 8.2 fL (ref 5.0–10.5)
Monocytes %: 15 %
Monocytes Absolute: 0.8 10*3/uL (ref 0.0–1.3)
Neutrophils %: 57.1 %
Neutrophils Absolute: 2.9 10*3/uL (ref 1.7–7.7)
Platelets: 243 10*3/uL (ref 135–450)
RBC: 3.18 M/uL — ABNORMAL LOW (ref 4.00–5.20)
RDW: 13.5 % (ref 12.4–15.4)
WBC: 5.2 10*3/uL (ref 4.0–11.0)

## 2021-09-28 LAB — BASIC METABOLIC PANEL W/ REFLEX TO MG FOR LOW K
Anion Gap: 7 (ref 3–16)
BUN: 11 mg/dL (ref 7–20)
CO2: 30 mmol/L (ref 21–32)
Calcium: 9 mg/dL (ref 8.3–10.6)
Chloride: 102 mmol/L (ref 99–110)
Creatinine: 0.8 mg/dL (ref 0.6–1.2)
Est, Glom Filt Rate: >60 (ref >60)
Glucose: 98 mg/dL (ref 70–99)
Potassium reflex Magnesium: 3.9 mmol/L (ref 3.5–5.1)
Sodium: 139 mmol/L (ref 136–145)

## 2021-09-28 LAB — MAGNESIUM: Magnesium: 2.1 mg/dL (ref 1.80–2.40)

## 2021-09-28 MED FILL — AMLODIPINE BESYLATE 5 MG PO TABS: 5 MG | ORAL | Qty: 1

## 2021-09-28 MED FILL — POLYETHYLENE GLYCOL 3350 17 G PO PACK: 17 g | ORAL | Qty: 1

## 2021-09-28 MED FILL — METHOCARBAMOL 500 MG PO TABS: 500 MG | ORAL | Qty: 1

## 2021-09-28 MED FILL — FAMOTIDINE 20 MG PO TABS: 20 MG | ORAL | Qty: 1

## 2021-09-28 MED FILL — ACETAMINOPHEN EXTRA STRENGTH 500 MG PO TABS: 500 MG | ORAL | Qty: 2

## 2021-09-28 MED FILL — VENLAFAXINE HCL 25 MG PO TABS: 25 MG | ORAL | Qty: 1

## 2021-09-28 MED FILL — ASPIRIN LOW DOSE 81 MG PO CHEW: 81 MG | ORAL | Qty: 1

## 2021-09-28 MED FILL — SENNA-LAX 8.6 MG PO TABS: 8.6 MG | ORAL | Qty: 1

## 2021-09-28 MED FILL — OXYCODONE HCL 5 MG PO TABS: 5 MG | ORAL | Qty: 2

## 2021-09-28 MED FILL — MIRTAZAPINE 15 MG PO TABS: 15 MG | ORAL | Qty: 1

## 2021-09-28 MED FILL — FUROSEMIDE 20 MG PO TABS: 20 MG | ORAL | Qty: 1

## 2021-09-28 MED FILL — TAMSULOSIN HCL 0.4 MG PO CAPS: 0.4 MG | ORAL | Qty: 1

## 2021-09-28 NOTE — Progress Notes (Signed)
Patient discharging to [facility]. Daughter aware and in agreement with plan. Transport at bedside. Patient transported with all belongings. Report given to receiving staff at [facility], all questions answered.

## 2021-09-28 NOTE — Care Coordination-Inpatient (Signed)
Case Management Assessment            Discharge Note                    Date / Time of Note: 09/28/2021 12:08 PM                  Discharge Note Completed by: Lin Givens, MSW    Patient Name: Leslie Bradley   Date of Birth: 27-Jun-1928  Diagnosis: Closed fracture of left hip, initial encounter [facility]) [S72.002A]  Subcapital fracture of femur, left, closed, initial encounter [facility]) [S72.012A]   Date / Time: 09/21/2021  1:40 PM    Current PCP: No primary care provider on file.  Clinic patient: No    Hospitalization in the last 30 days: No       Advance Directives:  Code Status: DNR-CCA  [place] DNR form completed and on chart: Yes    Financial:  Payor: [employer]ET[employer] MEDICARE / Plan: [employer]ET[employer] MEDICARE ADVANTAGE HMO / Product Type: Medicare /      Pharmacy:    [id number] - [place], [place] - [address] - P [phone] - F [phone]  [address]  [place] [address]  Phone: [phone] Fax: [phone]    [id number] [place] - [address] - P [phone] Carmon Ginsberg [phone]  [address]  [place] [address]  Phone: [phone] Fax: [phone]      Assistance purchasing medications?: Potential Assistance Purchasing Medications: No  Assistance provided by Case Management: None at this time    Does patient want to participate in local refill/ meds to beds program?: No    Meds To Beds General Rules:  1. Can ONLY be done Monday- Friday between 8:30am-5pm  2. Prescription(s) must be in pharmacy by 3pm to be filled same day  3.Copy of patient's insurance/ prescription drug card and patient face sheet must be sent along with the prescription(s)  4. Cost of Rx cannot be added to hospital bill. If financial assistance is needed, please contact unit case manager or Child psychotherapist; Case manager or Social Worker CANNOT provide pharmacy voucher for patients co-pays  5. Patients can then pick up the prescription on their way out of the hospital at discharge, or pharmacy can deliver to the  bedside if staff is available. (payment due at time of pick-up or delivery - cash, check, or card accepted)     Able to afford home medications/ co-pay costs: Yes    ADLS:  Current PT AM-PAC Score: 8 /24  Current OT AM-PAC Score: 12 /24      DISCHARGE Disposition: Skilled Nursing Facility (SNF): [facility]. [facility] Phone: [phone] Fax: [phone]    LOC at discharge: Skilled  COC Completed: Yes    Notification completed in HENS/PAS?:  Yes : CM has completed HENS online through secure website for SNF admission at [facility].   Document ID #: [id number]    IMM Completed:   Yes, Case management has presented and reviewed IMM letter #2 to the patient and/or family/ POA. Patient and/or family/POA verbalized understanding of their medicare rights and appeal process if needed. Patient and/or family/POA has signed and placed today's date (09/27/2021) and time (1040) on letter #2 on the the appropriate lines. Patient and/or family/POA, provided copy of signed letter and they are aware that this original copy of IMM letter #2 is available prior to discharge from the paper chart on the unit.  Electronic documentation has been entered  into epic for IMM letter #2 and original paper copy has been added to the paper chart at the nurses station.     IMM Letter given to Patient/Family/Significant other/Guardian/POA/by:: Leslie Bradley  IMM Letter date given:: 09/27/21  IMM Letter time given:: 1040    Transportation:  Research scientist (medical) for discharge: EMS transportation   Mode of Transport: Emergency planning/management officer - BLS  Reason for medical transport: Other: closed fracture of left hip, dementia, Ax2 for bed mobility  Name of Transport Company: [facility]  Phone: [phone]  Time of Transport: 1330    Transport form completed: Yes    Referrals made at [facility] for outpatient continued care:  Not Applicable    Additional CM Notes: CM spoke with pt's daughter and legal guardian, Leslie Bradley. Pt will discharge to [facility] today via [facility]  transport at 1330. Leslie Bradley is in agreement with this discharge plan. Leslie Bradley with [facility] is aware of discharge time and is accepting of the pt. No additional CM needs identified for discharge.     Nurse to call report to: [phone]  Clinicals have been faxed to: [phone]    COVID Result:  No results found for: COVID19    The Plan for Transition of Care is related to the following treatment goals of Closed fracture of left hip, initial encounter (HCC) [S72.002A]  Subcapital fracture of femur, left, closed, initial encounter (HCC) [S72.012A]    The Patient and/or patient representative Leslie Bradley and her family were provided with a choice of provider and agrees with the discharge plan Yes    Freedom of choice Bradley was provided with basic dialogue that supports the patient's individualized plan of care/goals and shares the quality data associated with the providers. Yes    Care Transitions patient: No    Lin Givens, MSW  [facility]  Case Management Department  Ph: [phone]  Fax: [phone]

## 2021-09-28 NOTE — Progress Notes (Signed)
Physical Therapy  Facility/Department: [facility] 5T ORTHO/NEURO  Daily Treatment Note  NAME: Leslie Bradley  DOB: 20-Feb-1928  MRN: [id number]    Date of Service: 09/28/2021    Discharge Recommendations: Leslie Bradley scored a 11/24 on the AM-PAC short mobility form. Current research shows that an AM-PAC score of 17 or less is typically not associated with a discharge to the patient's home setting. Based on the patient's AM-PAC score and their current functional mobility deficits, it is recommended that the patient have 3-5 sessions per week of Physical Therapy at d/c to increase the patient's independence.  Please see assessment section for further patient specific details.       PT Equipment Recommendations  Equipment Needed: No (defer)    Patient Diagnosis(es): The encounter diagnosis was Closed fracture of left hip, initial encounter ([place]).    Assessment   Assessment: Pt was limited by confusion and left hip pain requiring extra time to perform all activities. Transfers were effortful and painful using lift equipment this session. Pt is pleasantly confused requiring a lot re direction and cues; pt was left in the chair with needs in reach.   Equipment Needed: No (defer)     Plan    Physcial Therapy Plan  General Plan: 5-7 times per week     Restrictions  Position Activity Restriction  Other position/activity restrictions: WBAT, ambulate, ant hip precautions     Subjective    Subjective  Subjective: Pt was attempting to get out of bed upon arrival  Pain: grimace in pain but unable to rate due to dementia.  Orientation  Overall Orientation Status: Impaired     Objective   Balance  Sitting: /Good With BUE support   Standing: With with BUE support  Pharmacologist: Yes  Sit to Stand: Moderate assistance inside the STEDY  Stand to Sit: Minimum assistance inside the STEDY  Bed to Chair: Total assistance via STEDY     PT Exercises  Static Sitting Balance Exercises: Sitting EOB for approx 5 mins + 10  mins on the commode  Static standing with unilateral reaching to perform pericare and to hand hygiene tasks: x5 mins     Safety Devices  Type of Devices: Call light within reach;Chair alarm in place;Nurse notified;Left in chair;Gait belt     Goals  Short Term Goals  Time Frame for Short Term Goals: d/c  Short Term Goal 1: sup<>sit mod Ax1  Short Term Goal 2: sit<>stand mod Ax2 Goal met; updated goal: sit<>stand min Ax1  Short Term Goal 3: bed <> chair mod Ax2 with RW  Short Term Goal 4: amb 30' with RW and CGA  Patient Goals   Patient Goals : none stated    Education  Patient Education  Education Given To: Patient  Education Provided: Role of Therapy  Education Method: Demonstration  Barriers to Learning: None  Education Outcome: Verbalized understanding    Therapy Time   Individual Concurrent Group Co-treatment   Time In 1138         Time Out 1217         Minutes [address] A Lyrical Sowle, PTA

## 2021-09-28 NOTE — Discharge Summary (Addendum)
INTERNAL MEDICINE DEPARTMENT AT THE [facility]  DISCHARGE SUMMARY    Patient ID: Leslie Bradley                                             Discharge Date: 09/28/2021   Patient's PCP: No primary care provider on file.                                          Discharge Physician: [place] Branch, MD MD  Admit Date: 09/21/2021   Admitting Physician: Virgilio Belling, MD    PROBLEMS DURING HOSPITALIZATION:  Present on Admission:   Subcapital fracture of femur, left, closed, initial encounter (HCC)   Closed fracture of left hip (HCC)      DISCHARGE DIAGNOSES:  Fracture of femur s/p ORIF  Dementia    HPI: Leslie Bradley is a 88 y.o. female with pmhx dementia and HTN who presents to the emergency department from assisted living with left hip pain after fall. Patient unable to provide history d/t dementia, facility reports they are concerned she tripped and fell, the fall was not witnessed. Staff alerted by patient screaming and found patient on the ground holding onto someone's legs. Patient not on any anticoagulation.      On arrival to ED patient afebrile, hypertensive, regular rate satting well on RA. On initial evaluation by ED, patient complaining of L hip pain, L leg shorter compared right. XR showed impacted L femoral neck fracture with superior migration. Seen by Ortho, patient has displaced left hip subcapital femur fracture, plan to operate tomorrow if patient medically stable. Per daughter patient is at baseline mental status. Patient admitted for further management.    The following issues were addressed during hospitalization:    Displaced L Hip Subcapital Femur Fracture   Seen by ortho in ED, had left hip hemiarthroplasty on 8/24 with Dr. Ricki Miller. Tolerated well  - Orthopedic surgery consulted; appreciate recs              - left hip hemiarthroplasty on 8/24              - PT/OT consulted, scores 6/7 respectively              - Weight bearing as tolerated              - ASA 81 mg BID for 4 weeks               - F/u in 4 weeks   - Pain regimen:              - Oxycodone 5 mg q4h prn              - Dilaudid 0.25 mg IV q4h prn for breakthrough pain              - Robaxin 500 mg TID      Increased O2 requirements  Suspect it is 2/2 to pt pulling lines and not having NC in. Pt also likely has component of sleep apnea as saturations worsen when snoring. Little suspicion for PE at this time given no associated tachycardia, chest pain, or report of SOB. CXR negative for infection.  - Continue oxygen supplementation as needed  - Encouraged incentive spirometry  Limited urine output, improved  Improving urine output. Suspect initial reduced urinary output primarily due to low volume status compiled with physiologic ADH secretion from stress. Creatinine is stable suggesting no signs of kidney damage at this time. Was retaining overnight, likely due to lack of ambulation.  - Spontaneously voiding, foley catheter no longer needed  - Continue flomax     Physical Exam:  BP (!) 148/75   Pulse 80   Temp 98 F (36.7 C) (Oral)   Resp 16   Ht 5\' 10"  (1.778 m)   Wt 221 lb 3.2 oz (100.3 kg)   SpO2 92%   BMI 31.74 kg/m     Constitutional:       General: She is not in acute distress.     Appearance: She is obese.   HENT:      Head: Normocephalic.      Mouth/Throat:      Mouth: Mucous membranes are moist.   Cardiovascular:      Rate and Rhythm: Normal rate and regular rhythm.      Pulses: Normal pulses.   Pulmonary:      Effort: Pulmonary effort is normal.      Breath sounds: Lung sounds clear to auscultation  Abdominal:      General: Bowel sounds are normal.      Palpations: Abdomen is soft.   Musculoskeletal:      Cervical back: Normal range of motion.      Comments: L hip with dressing  Skin:     General: Skin is warm and dry.   Neurological:      Mental Status: She is disoriented. Waxing and waning mental status at baseline     Comments: Oriented to self only    Consults: orthopedic surgery  Significant Diagnostic Studies:   x-ray of femur on the left  Treatments: surgery: ORIF  Disposition: SNF  Discharged Condition: Stable  Follow Up: Primary Care Physician in one week    DISCHARGE MEDICATION:     Medication List        START taking these medications      aspirin 81 MG chewable tablet  Take 1 tablet by mouth daily            CHANGE how you take these medications      amLODIPine 5 MG tablet  Commonly known as: NORVASC  Take 1 tablet by mouth daily  What changed:   additional instructions  Another medication with the same name was removed. Continue taking this medication, and follow the directions you see here.            CONTINUE taking these medications      famotidine 40 MG tablet  Commonly known as: PEPCID     furosemide 20 MG tablet  Commonly known as: LASIX     melatonin 5 MG Tabs tablet     mirtazapine 15 MG tablet  Commonly known as: REMERON     venlafaxine 100 MG tablet  Commonly known as: EFFEXOR  TAKE 1 TABLET BY MOUTH TWICE A DAY               Where to Get Your Medications        These medications were sent to [facility] [address] - [place] [address] - P [phone] - F [phone]  [phone] [place]      Phone: [phone]   amLODIPine 5 MG tablet  aspirin 81 MG  chewable tablet       Activity: activity as tolerated  Diet: regular diet  Wound Care: as directed    Time Spent on discharge is more than 37 minutes    Signed:  [place] Branch, MD,  PGY-1  10/13/2021      Addendum to Resident H&P/Progress Note/Discharge Summary:  I have personally seen,examined and evaluated the patient. I have reviewed the current history, physical findings, labs.  I have created the assessment and plan/medical decision making in its entirety and agree with note as documented by resident MD ( Dr.Jordan Branch)          Virgilio Belling, MD

## 2021-09-28 NOTE — Progress Notes (Signed)
Patient is alert and oriented to self, disoriented to place, time, and situation. Pleasantly confused and needs reorientation frequently.  VSS on room air. Ambulating x2 with stedy. Unable to verbalize pain level or goal, managing pain using appropriate pain scale. Voiding well via purewick. No bm this shift. Patient turned q2 hrs.     Patient is currently resting in bed with bed alarm on for safety. Call light within reach and all fall precautions in place. Plan of care ongoing.    Electronically signed by Vincenza Hews, RN on 09/28/2021 at 4:46 AM

## 2021-09-28 NOTE — Care Coordination-Inpatient (Signed)
CM following: CM spoke with Leslie Bradley with [facility]. Precert is still pending for the pt. CM will continue to follow for discharge planning.  Electronically signed by Lin Givens, MSW on 09/28/2021 at 9:58 AM  [phone]

## 2021-09-28 NOTE — Progress Notes (Signed)
Progress Note    Admit Date: 09/21/2021  Day: 3  Diet: ADULT DIET; Regular    CC: Fall and hip pain    Interval history: Leslie Bradley is well this morning. She remains pleasantly confused and oriented only to self. She continued to spontaneously void overnight, total UOP yesterday of . She is awaiting placement.      HPI:     Leslie Bradley is a 88 y.o. female with pmhx dementia and HTN who presents to the emergency department from assisted living with left hip pain after fall. Patient unable to provide history d/t dementia, facility reports they are concerned she tripped and fell, the fall was not witnessed. Staff alerted by patient screaming and found patient on the ground holding onto someone's legs. Patient not on any anticoagulation.      On arrival to ED patient afebrile, hypertensive, regular rate satting well on RA. On initial evaluation by ED, patient complaining of L hip pain, L leg shorter compared right. XR showed impacted L femoral neck fracture with superior migration. Seen by Ortho, patient has displaced left hip subcapital femur fracture, plan to operate tomorrow if patient medically stable. Per daughter patient is at baseline mental status. Patient admitted for further management.    Medications:     Scheduled Meds:   tamsulosin  0.4 mg Oral Daily    polyethylene glycol  17 g Oral Daily    senna  1 tablet Oral Daily    aspirin  81 mg Oral BID    methocarbamol  500 mg Oral TID    acetaminophen  1,000 mg Oral 3 times per day    famotidine  20 mg Oral Daily    mirtazapine  15 mg Oral Nightly    amLODIPine  5 mg Oral Daily    furosemide  20 mg Oral Daily    venlafaxine  100 mg Oral BID WC    sodium chloride flush  5-40 mL IntraVENous 2 times per day     Continuous Infusions:   sodium chloride      dextrose       PRN Meds:HYDROmorphone, oxyCODONE **OR** oxyCODONE, melatonin, sodium chloride flush, sodium chloride, ondansetron **OR** ondansetron, glucose, dextrose bolus **OR** dextrose bolus,  glucagon (rDNA), dextrose    Objective:   Vitals:   T-max:  Patient Vitals for the past 8 hrs:   BP Temp Temp src Pulse Resp SpO2   09/28/21 0656 (!) 173/78 97.8 F (36.6 C) Oral 81 19 92 %   09/28/21 0502 -- -- -- -- 18 --   09/28/21 0432 -- -- -- -- 18 --   09/28/21 0228 (!) 157/78 97.8 F (36.6 C) Oral 78 16 92 %       Intake/Output Summary (Last 24 hours) at 09/28/2021 0703  Last data filed at 09/28/2021 0228  Gross per 24 hour   Intake 890 ml   Output 1220 ml   Net -330 ml     Review of systems  A 10 point ROS was conducted with pertinent positives and negatives listed as above. Limited 2/2 AMS.    Physical Exam  Constitutional:       General: She is not in acute distress.     Appearance: She is obese.   HENT:      Head: Normocephalic.      Mouth/Throat:      Mouth: Mucous membranes are moist.   Cardiovascular:      Rate and Rhythm: Normal rate and regular rhythm.  Pulses: Normal pulses.   Pulmonary:      Effort: Pulmonary effort is normal.      Breath sounds: Lung sounds clear to auscultation  Abdominal:      General: Bowel sounds are normal.      Palpations: Abdomen is soft.   Musculoskeletal:      Cervical back: Normal range of motion.      Comments: L hip with dressing  Skin:     General: Skin is warm and dry.   Neurological:      Mental Status: She is disoriented. Waxing and waning mental status at baseline     Comments: Oriented to self only     LABS:    CBC:   Recent Labs     09/26/21  0835 09/27/21  0659 09/28/21  0628   WBC 6.2 5.3 5.2   HGB 10.1* 9.8* 10.1*   HCT 30.2* 29.0* 29.9*   PLT 191 206 243   MCV 94.8 95.8 94.0     Renal:    Recent Labs     09/25/21  0753 09/26/21  0835 09/27/21  0659   NA 139 137 139   K 4.3 4.1 3.8   CL 105 101 102   CO2 27 33* 30   BUN 17 15 12    CREATININE 0.9 0.8 0.8   GLUCOSE 147* 100* 102*   CALCIUM 8.2* 8.6 8.5   MG 2.10 2.00 2.10   ANIONGAP 7 3 7      Hepatic: No results for input(s): AST, ALT, BILITOT, BILIDIR, PROT, LABALBU, ALKPHOS in the last 72  hours.  Troponin: No results for input(s): TROPONINI in the last 72 hours.  BNP: No results for input(s): BNP in the last 72 hours.  Lipids: No results for input(s): CHOL, HDL in the last 72 hours.    Invalid input(s): LDLCALCU, TRIGLYCERIDE  ABGs:  No results for input(s): PHART, PCO2ART, PO2ART, HCO3ART, BEART, THGBART, O2SATART, TCO2ART in the last 72 hours.    INR: No results for input(s): INR in the last 72 hours.  Lactate: No results for input(s): LACTATE in the last 72 hours.  Cultures:  -----------------------------------------------------------------  RAD:   XR CHEST PORTABLE   Final Result      1. No acute disease.      11:34 a.m.      XR PELVIS (1-2 VIEWS)   Final Result      Postsurgical changes of left total hip arthroplasty without evidence of   immediate complication.      XR HIP 2-3 VW W PELVIS LEFT   Final Result   Operative fluoroscopy, as above.      FLUORO FOR SURGICAL PROCEDURES   Final Result   Operative fluoroscopy, as above.      XR CHEST PORTABLE   Final Result      Femoral neck acute transverse oblique fracture, likely subcapital in position      Mid to distal femur remains intact including the knee joint.      SINGLE AP VIEW OF THE PELVIS       HISTORY: Left hip and pelvic pain.      PROCEDURE: Single AP supine view of the pelvis was obtained      FINDINGS:      Impacted left femoral neck fracture is noted with superior migration of the   greater trochanter. Right femoral head and neck and proximal hip appear intact.      The remainder the pelvis is intact. There are no  unusual calcifications.      IMPRESSION:      Impacted left femoral neck fracture with superior migration, likely subcapital   in position, transverse or oblique.      CHEST PORTABLE VIEW      HISTORY: Larey Seat, hip fracture, preoperative   COMPARISON: February 08, 2018      PROCEDURE: AP portable semierect upright view of the chest was obtained at 1435   hours      FINDINGS:       LINES: No central lines or tube      LUNGS:  Lungs appear grossly clear      HEART: Heart size borderline. Atherosclerotic change of aorta      There is no evidence of pneumothorax      IMPRESSION:      1. No acute process or consolidation   2. Atherosclerotic aorta.               XR FEMUR LEFT (MIN 2 VIEWS)   Final Result      Femoral neck acute transverse oblique fracture, likely subcapital in position      Mid to distal femur remains intact including the knee joint.      SINGLE AP VIEW OF THE PELVIS       HISTORY: Left hip and pelvic pain.      PROCEDURE: Single AP supine view of the pelvis was obtained      FINDINGS:      Impacted left femoral neck fracture is noted with superior migration of the   greater trochanter. Right femoral head and neck and proximal hip appear intact.      The remainder the pelvis is intact. There are no unusual calcifications.      IMPRESSION:      Impacted left femoral neck fracture with superior migration, likely subcapital   in position, transverse or oblique.      CHEST PORTABLE VIEW      HISTORY: Larey Seat, hip fracture, preoperative   COMPARISON: February 08, 2018      PROCEDURE: AP portable semierect upright view of the chest was obtained at 1435   hours      FINDINGS:       LINES: No central lines or tube      LUNGS: Lungs appear grossly clear      HEART: Heart size borderline. Atherosclerotic change of aorta      There is no evidence of pneumothorax      IMPRESSION:      1. No acute process or consolidation   2. Atherosclerotic aorta.               XR PELVIS (1-2 VIEWS)   Final Result      Femoral neck acute transverse oblique fracture, likely subcapital in position      Mid to distal femur remains intact including the knee joint.      SINGLE AP VIEW OF THE PELVIS       HISTORY: Left hip and pelvic pain.      PROCEDURE: Single AP supine view of the pelvis was obtained      FINDINGS:      Impacted left femoral neck fracture is noted with superior migration of the   greater trochanter. Right femoral head and neck and proximal  hip appear intact.      The remainder the pelvis is intact. There are no unusual calcifications.      IMPRESSION:      Impacted left femoral  neck fracture with superior migration, likely subcapital   in position, transverse or oblique.      CHEST PORTABLE VIEW      HISTORY: Larey Seat, hip fracture, preoperative   COMPARISON: February 08, 2018      PROCEDURE: AP portable semierect upright view of the chest was obtained at 1435   hours      FINDINGS:       LINES: No central lines or tube      LUNGS: Lungs appear grossly clear      HEART: Heart size borderline. Atherosclerotic change of aorta      There is no evidence of pneumothorax      IMPRESSION:      1. No acute process or consolidation   2. Atherosclerotic aorta.                   Assessment/Plan:     Displaced L Hip Subcapital Femur Fracture   Seen by ortho in ED, had left hip hemiarthroplasty on 8/24 with Dr. Ricki Miller. Tolerated well  - Orthopedic surgery consulted; appreciate recs   - left hip hemiarthroplasty on 8/24   - PT/OT consulted, scores 6/7 respectively   - Weight bearing as tolerated   - ASA 81 mg BID for 4 weeks   - F/u in 4 weeks   - Pain regimen:   - Oxycodone 5 mg q4h prn   - Dilaudid 0.25 mg IV q4h prn for breakthrough pain   - Robaxin 500 mg TID     Increased O2 requirements  Suspect it is 2/2 to pt pulling lines and not having NC in. Pt also likely has component of sleep apnea as saturations worsen when snoring. Little suspicion for PE at this time given no associated tachycardia, chest pain, or report of SOB. CXR negative for infection.  - Continue oxygen supplementation as needed  - Encouraged incentive spirometry    Limited urine output, improved  Improving urine output. Suspect initial reduced urinary output primarily due to low volume status compiled with physiologic ADH secretion from stress. Creatinine is stable suggesting no signs of kidney damage at this time. Was retaining overnight, likely due to lack of ambulation.  - Spontaneously  voiding, foley catheter no longer needed  - Continue flomax     Chronic Medical Conditions     HTN   - Continue home amlodipine 5 mg   - Continue home lasix 20 mg      Anxiety   - Continue home venlafaxine 100 mg BID     Code Status: DNR-CCA  FEN: Adult diet; regular  PPX: pepcid, ASA 81 BID  DISPO: GGMF    [place] Jw Covin, MD, PGY-1  09/28/21  7:03 AM    This patient has been staffed and discussed with Virgilio Belling, MD.

## 2021-09-28 NOTE — Plan of Care (Signed)
Problem: Discharge Planning  Goal: Discharge to home or other facility with appropriate resources  Outcome: Progressing  Note: Patient will discharge to [facility]. Social work following for needs. Plan of care continues.      Problem: Pain  Goal: Verbalizes/displays adequate comfort level or baseline comfort level  Outcome: Progressing  Note: Denies having any pain. Educated on numerical pain rating scale. PRN dilaudid and oxy available for pain control. Patient educated on medications and side effects. Assisted with turning and repositioning in bed/chair for comfort and pressure relief. Notified to let RN know if pain manifests and medication is wanted. Plan of care continues.      Problem: Safety - Adult  Goal: Free from fall injury  Outcome: Progressing  Note: Fall precautions in place. Non-skid socks on. Bed locked in lowest position with alarm on and side rails up. Bedside table, belongings, and call light within reach. Hourly rounding in anticipation of patient needs. Floor clean and free from clutter. Room door open. Plan of care continues.      Problem: Neurosensory - Adult  Goal: Achieves stable or improved neurological status  Outcome: Progressing  Note: Confused at baseline. Oriented to self only. Easily reoriented and redirectable. Neuro checks q4. Blinds open. Plan of care continues.

## 2021-09-28 NOTE — Unmapped (Signed)
This is a notification of a Discharge Alert generated by [facility]. This patient visited the following location: [facility])Admit Date: 09/21/2021 13:40Discharge Date: 09/28/2021 13:50Visit Type: InpatientChief complaint: Fracture of unspecified part   of neck of Diagnosis:  (S72.002A)Alert Category: Attending Physician: Beaulah Corin Physician: Consulting Physician: Jannetta Quint, SYDNEYCopied Physician(s): [facility] is a not-for-profit corporation that was founded in 1997 as a   community effort to enhance the ability to share health information electronically in the [place] area. Today, [facility] is one of the nations largest and most financially sustainable regional health information exchange (HIE)   organizations.

## 2021-09-29 NOTE — Unmapped (Signed)
[facility] H&P     The [facility] chart was reviewed. Additional History obtained from:  patient, EPIC, and staff         Subjective  Admit to [facility] s/p Hip fx from fall.   88 year old with chronic severe dementia had a fall apparently at her memory care assisted living [address] [facility] and sustained a hip fracture.  Underwent total replacement and has been doing very well.  Case additionally discussed with the patient's son-in-law who lives in [place] and is very familiar with his mother-in-law's baseline status.  He says she appears very close to that.  She was denying any pain this morning when we saw her.  She was having lots of difficulty attending to my questions but was not in any distress today.  I did discuss her case with speech therapy who found she had a markedly impaired cognitive testing and was unable to retain new information regarding using caution when ambulating.  Physical and Occupational Therapy consultations are pending.    Recent Hospital Stay: Encounter Details    Encounter Details  Date Type Department Care Team Description   09/21/2021 - 09/28/2021 [facility] Encounter [facility] 5T Ortho/Neuro   [address]   [place], [place] [address]   [phone]  Theora Master, MD   [address]   [id number]   [place], [place]       Closed fracture of left hip, initial encounter [facility]) (Primary Dx)   [place], MD - 09/26/2021 9:33 AM EDT   Formatting of this note might be different from the original.  Please follow-up with Dr. Ricki Miller in orthopedic surgery in 4 weeks. Continue taking Aspirin 81mg  daily for 4 weeks. Your amlodipine dose you should be taking now is 5mg  once a day.   Past Surgical History:  Past Surgical History:  Procedure Laterality Date  BREAST LUMPECTOMY  HIP SURGERY Left 09/22/2021  LEFT HIP HEMIARTHROPLASTY ANTERIOR APPROACH performed by Nunzio Cobbs, MD at [facility] OR    Medications at Time of Discharge  - documented as of this  encounter  Medications at Time of Discharge  Medication Sig Dispensed Refills Start Date End Date   aspirin 81 MG chewable tablet  Take 1 tablet by mouth daily 30 tablet  0 09/26/2021 10/26/2021   amLODIPine (NORVASC) 5 MG tablet  Take 1 tablet by mouth daily 30 tablet  3 09/27/2021     famotidine (PEPCID) 40 MG tablet  Take 1 tablet by mouth 2 times daily   0       furosemide (LASIX) 20 MG tablet  Take 1 tablet by mouth daily   0       melatonin 5 MG TABS tablet  Take 1 tablet by mouth nightly   0       mirtazapine (REMERON) 15 MG tablet  Take 1 tablet by mouth nightly   0       venlafaxine (EFFEXOR) 100 MG tablet  TAKE 1 TABLET BY MOUTH TWICE A DAY 180 tablet  2 09/09/2018           Condition on Transfer to SNF:   CODE STATUS: DNR - CCA    ROS: as noted in HPI  Review of Systems   Unable to perform ROS: Dementia   Past Medical History:  No past medical history on file.  Past Surgical History:  No past surgical history on file.  Current Problem List:   Patient Active Problem List    Diagnosis Date  Noted    Acute encephalopathy 09/29/2021    Acute UTI (urinary tract infection) 09/29/2021    Anxiety 01/01/2020    Gall stones 01/01/2020    Altered mental status 02/08/2018    Adjustment disorder with mixed anxiety and depressed mood 02/05/2018    Anxiety 01/08/2018    Late onset Alzheimer's dementia without behavioral disturbance (CMS-HCC) 05/16/2017    Chronic cough 03/02/2017    Nephrolithiasis 10/10/2016    Aortic atherosclerosis (CMS-HCC) 10/05/2016    Obesity (BMI 30.0-34.9) 08/30/2016    MDD (major depressive disorder) 03/29/2015    Hyperglycemia 02/09/2014    Allergic rhinitis 02/09/2014    Essential hypertension, benign 03/05/2013    Hx of nonmelanoma skin cancer 03/05/2013    Personal history of malignant neoplasm of breast 06/27/2011     Social History:      Allergies:Reviewed in Chart.     Medications:  Please see attached Facility Sheets for Up to Date Medications. Facility list reviewed and orders signed      Objective  Facility Vitals were reviewed.   Physical Exam  Vitals and nursing note reviewed.   Constitutional:       Appearance: Normal appearance. She is not ill-appearing.   HENT:      Mouth/Throat:      Mouth: Mucous membranes are moist.   Cardiovascular:      Rate and Rhythm: Normal rate.      Pulses: Normal pulses.      Heart sounds: Normal heart sounds.   Pulmonary:      Effort: Pulmonary effort is normal.      Breath sounds: Normal breath sounds.   Abdominal:      General: Bowel sounds are normal.      Palpations: Abdomen is soft.   Musculoskeletal:         General: No deformity.      Right lower leg: No edema.      Left lower leg: Edema present.      Comments: Trace edema on the surgical leg   Skin:     General: Skin is warm and dry.      Capillary Refill: Capillary refill takes less than 2 seconds.   Neurological:      General: No focal deficit present.      Mental Status: She is alert. Mental status is at baseline. She is disoriented.      Gait: Gait abnormal.   Psychiatric:         Mood and Affect: Mood normal.         Behavior: Behavior normal.       Notable Labs:   CBC:   Recent Labs   09/25/21  0753 09/26/21  0835 09/27/21  0659   WBC 7.7 6.2 5.3   HGB 9.5* 10.1* 9.8*   HCT 28.9* 30.2* 29.0*   PLT 168 191 206   MCV 97.4 94.8 95.8     Renal:   Recent Labs   09/25/21  0753 09/26/21  0835 09/27/21  0659   NA 139 137 139   K 4.3 4.1 3.8   CL 105 101 102   CO2 27 33* 30   BUN 17 15 12    CREATININE 0.9 0.8 0.8   GLUCOSE 147* 100* 102*   CALCIUM 8.2* 8.6 8.5   MG 2.10 2.00 2.10   ANIONGAP 7 3 7      RAD:   XR CHEST PORTABLE   Final Result     1. No acute disease.  11:34 a.m.     XR PELVIS (1-2 VIEWS)   Final Result     Postsurgical changes of left total hip arthroplasty without evidence of   immediate complication.     XR HIP 2-3 VW W PELVIS LEFT   Final Result   Operative fluoroscopy, as above.     FLUORO FOR SURGICAL PROCEDURES   Final Result   Operative fluoroscopy, as above.     XR CHEST PORTABLE    Final Result     Assessment & Plan  Mary Frost is a 88 y.o. female seen today at [facility] for evaluation following a qualifying hospitalization. We will order PT/OT and ST evaluations, as well as Skilled Nursing services as detailed below. I have reviewed the provided records and signed orders for care here at [facility].       Assessment/Plan:    Displaced L Hip Subcapital Femur Fracture  Seen by ortho in ED, had left hip hemiarthroplasty on 8/24 with Dr. Ricki Miller. Tolerated well  - Orthopedic surgery consulted; appreciate recs  - left hip hemiarthroplasty on 8/24  - PT/OT consulted, scores 6/7 respectively  - Weight bearing as tolerated  - ASA 81 mg BID for 4 weeks  - F/u in 4 weeks  - Pain regimen:  - Oxycodone 5 mg q4h prn; discontinued as the patient is without pain just using Tylenol  - Robaxin 500 mg TID as needed although we will try not to use given cognitive impairment    2.  Bladder obstruction following surgery, discharged on Flomax although orthostatic hypotension could precipitate another fall so I am going to discontinue it at this time.    3. HTN  - Continue home amlodipine 5 mg  - Continue home lasix 20 mg    4. Anxiety  - Continue home venlafaxine 100 mg BID    Code Status: DNR-CCA  FEN: Adult diet; regular  PPX: pepcid, ASA 81 BID x 4 weeks.     I did discuss with the family that severe cognitive impairment and poor safety awareness represent an unavoidable risk of falls with injury.  A tab alarm and other systems to reduce the chances of an injurious fall will be implemented but he is aware that her fragility and cognitive impairment make falls unavoidable.      Myrtie Hawk MD, MEd  NPI [id number]      I spent 10 minutes speaking with the patient/caregiver, conducting an interview, performing an exam, and educating the patient/caregiver on my assessment and plan. I also spent 35 minutes, on the same day as the encounter, preparing to see the patient (eg, review of tests), obtaining  and/or reviewing separately obtained history, counseling and educating the family/caregiver , ordering medications, tests, or procedures, documenting clinical information in the electronic or other health record, and providing care coordination . Total Time spent for this encounter on the same day of service: 45 minutes

## 2022-04-17 ENCOUNTER — Inpatient Hospital Stay
Admit: 2022-04-17 | Discharge: 2022-04-17 | Disposition: A | Discharge status: Home / Self Care | Payer: MEDICARE | Attending: Emergency Medicine

## 2022-04-17 DIAGNOSIS — R040 Epistaxis: Secondary | ICD-10-CM

## 2022-04-17 MED ORDER — IBUPROFEN 200 MG PO TABS
200 | Freq: Once | ORAL | Status: AC
Start: 2022-04-17 — End: 2022-04-17
  Administered 2022-04-17: 21:00:00 600 mg via ORAL

## 2022-04-17 MED ORDER — OXYMETAZOLINE HCL 0.05 % NA SOLN
0.05 | Freq: Once | NASAL | Status: AC
Start: 2022-04-17 — End: 2022-04-17
  Administered 2022-04-17: 18:00:00 2 via NASAL

## 2022-04-17 MED ORDER — ACETAMINOPHEN 325 MG PO TABS
325 | Freq: Once | ORAL | Status: AC
Start: 2022-04-17 — End: 2022-04-17
  Administered 2022-04-17: 21:00:00 650 mg via ORAL

## 2022-04-17 MED ORDER — CEPHALEXIN 500 MG PO CAPS
500 MG | ORAL_CAPSULE | Freq: Four times a day (QID) | ORAL | 0 refills | Status: AC
Start: 2022-04-17 — End: 2022-04-24

## 2022-04-17 MED ORDER — OXYMETAZOLINE HCL 0.05 % NA SOLN
0.05 % | Freq: Two times a day (BID) | NASAL | 0 refills | Status: DC
Start: 2022-04-17 — End: 2022-04-17

## 2022-04-17 MED FILL — 12 HOUR NASAL DECONGESTANT 0.05 % NA SOLN: 0.05 % | NASAL | Qty: 30

## 2022-04-17 MED FILL — ACETAMINOPHEN 325 MG PO TABS: 325 MG | ORAL | Qty: 2

## 2022-04-17 MED FILL — IBUPROFEN 200 MG PO TABS: 200 MG | ORAL | Qty: 1

## 2022-04-17 NOTE — ED Provider Notes (Signed)
[place] ENCOUNTER          EM RESIDENT NOTE       Date of evaluation: 04/17/2022    Chief Complaint     Epistaxis    History of Present Illness     Leslie Bradley is a 89 y.o. female with PMH of HTN on lisinopril and Norvasc who presents to the ED from [facility] with left-sided nose bleed. The nose bleed appears to have started just prior to arrival. It is unclear if the facility attempted any measures to control it. She is not on any blood thinners according to family. Compressive nose clamp was applied by EMS prior to arrival. At time of exam, there is no active epistaxis. No reported trauma.    History limited by patent's baseline dementia.    The patient denies any other aggravating or alleviating factors unless otherwise explicitly stated above.    MEDICAL DECISION MAKING / ASSESSMENT / PLAN     INITIAL VITALS: BP: (!) 165/85, Temp: 97 F (36.1 C), Pulse: 84, Respirations: 20, SpO2: 95 %    Leslie Bradley is a 89 y.o. female with a history and presentation as described above in HPI. This patient was also evaluated by the attending physician. All care plans were discussed and agreed upon.    Presenting with chief complaint of left-sided epistaxis. Unclear if any intervention was attempted at ALF. Compressive nose clamp applied by EMS for 5 minutes PTA. She is not on any anti-coagulation. At time of exam, nose clamp is removed and there is no active epistaxis. Blood clot noted in anterior left nare. Given hemostasis with minimal intervention, low concern for posterior bleed. She has a history of epistaxis in the past but has not had any frequent bleeds. No precipitating trauma, so suspect the epistaxis may be related to dry air. Will instill Afrin and monitor for continued hemostasis.     ED Course as of 04/17/22 1758   Mon Apr 17, 2022   1344 Attempted to call patient's facility for additional information, but unable to speak to any nurse. [ES]   [id number] On  reassessment, patient now with some slight oozing out of left nare. Nasal clamp replaced. [ES]   [id number] Nose bleeding resumed. RN instilled 2 sprays afrin in left nare and reclamped. [ES]   [id number] On reassessment, a small active bleed was noted over the septum in the area of Kiesselbach's plexus. A 4.5cm anterior rhino rocket was placed in the left nare and inflated. [ES]   1604 Patient continues to have slight oozing with Rhino Rocket in place. No blood seen in posterior oropharynx, no cough, so low concern for posterior bleed. Balloon inflated slightly more. Will continue to monitor. Patient complaining of mild headache so will give dose of PO Tylenol and ibuprofen. [ES]   [id number] Bleeding better controlled, now only every 5 minutes. Will continue to monitor. [ES]   1722 Bleeding further improved, now dripping anteriorly every 15-30 minutes. No cough or blood in the oropharynx. Epistaxis now controlled with anterior rhino rocket. Discussed plan to discharge with ENT follow-up in 48 hours with Dr. Ginette Otto. Daughter is agreeable with the plan of discharge. [ES]      ED Course User Index  [ES] Agustin Cree, MD     Epistaxis controlled with anterior nasal balloon/rhino rocket. Patient given referral to Dr. Ginette Otto (ENT) and told to follow-up in 48 hours. Will discharge on prophylactic cephalexin while balloon is in  place. Daughter updated on plan and is agreeable.     Is this patient to be included in the SEP-1 core measure? No Exclusion criteria - the patient is NOT to be included for SEP-1 Core Measure due to: Infection is not suspected    Medical Decision Making  Risk  OTC drugs.  Prescription drug management.      Clinical Impression     1. Epistaxis      Disposition     PATIENT REFERRED TO:  Zimmer, Frutoso Chase, MD  [address]  [place] [address]  [phone]    Schedule an appointment as soon as possible for a visit in 2 days  For removal of the Rhino Rocket    DISCHARGE MEDICATIONS:  New  Prescriptions    CEPHALEXIN (KEFLEX) 500 MG CAPSULE    Take 1 capsule by mouth 4 times daily for 7 days     DISPOSITION Decision To Discharge 04/17/2022 05:56:00 PM    Discharge with ENT follow-up in 48 hours to Dr. Ginette Otto    Diagnostic Results and Other Data     RADIOLOGY:  No orders to display     LABS:   No results found for this visit on 04/17/22.  EKG   None    ED BEDSIDE ULTRASOUND:  No results found.    RECENT VITALS:  BP: (!) 170/91, Temp: 97 F (36.1 C), Pulse: 88,Respirations: 16, SpO2: 95 %     Procedures     None    ED Course     Nursing Notes, Past Medical Hx, Past Surgical Hx, Social Hx, Allergies, and Family Hx were reviewed.    The patient was given the following medications:  Orders Placed This Encounter   Medications    oxymetazoline (AFRIN) 0.05 % nasal spray 2 spray    acetaminophen (TYLENOL) tablet 650 mg    ibuprofen (ADVIL;MOTRIN) tablet 600 mg    cephALEXin (KEFLEX) 500 MG capsule     Sig: Take 1 capsule by mouth 4 times daily for 7 days     Dispense:  28 capsule     Refill:  0     Stop taking these once the Rhino Rocket is removed.    DISCONTD: oxymetazoline (12 HOUR NASAL SPRAY) 0.05 % nasal spray     Sig: 2 sprays by Nasal route 2 times daily     Dispense:  15 mL     Refill:  0     CONSULTS:  None    Review of Systems     Review of systems otherwise unremarkable unless discussed in HPI.    Past Medical, Surgical, Family, and Social History     She has a past medical history of Breast cancer ([place]) and Hypertension.  She has a past surgical history that includes Breast lumpectomy and hip surgery (Left, 09/22/2021).  Her family history is not on file.  She reports that she has never smoked. She has never used smokeless tobacco. She reports that she does not drink alcohol and does not use drugs.    Medications     Previous Medications    AMLODIPINE (NORVASC) 5 MG TABLET    Take 1 tablet by mouth daily    ASPIRIN 81 MG CHEWABLE TABLET    Take 1 tablet by mouth daily    FAMOTIDINE (PEPCID) 40 MG  TABLET    Take 1 tablet by mouth 2 times daily    FUROSEMIDE (LASIX) 20 MG TABLET  Take 1 tablet by mouth daily    MELATONIN 5 MG TABS TABLET    Take 1 tablet by mouth nightly    MIRTAZAPINE (REMERON) 15 MG TABLET    Take 1 tablet by mouth nightly    VENLAFAXINE (EFFEXOR) 100 MG TABLET    TAKE 1 TABLET BY MOUTH TWICE A DAY     Allergies     She is allergic to aspirin, erythromycin, amoxicillin, and diflucan [fluconazole].    Physical Exam     INITIAL VITALS: BP: (!) 165/85, Temp: 97 F (36.1 C), Pulse: 84, Respirations: 20, SpO2: 95 %     General:  Well-appearing. Resting comfortably.   Eyes:  Pupils reactive. EOMI. No discharge from eyes.  ENT: No oropharyngeal edema, tolerating oral secretions. Dried blood over face. Blood clot in anterior left nare with no active bleeding. After removal of blood clot, some oozing is noted from anterior left nare around Kiesselbach's plexus. No blood in posterior oropharynx  Pulmonary:   Non-labored breathing. Breath sounds clear bilaterally.  Cardiac:  Regular rate and rhythm.  Abdomen:  Soft. Non-distended. Non-tender.  Musculoskeletal:  No long bone deformity. No peripheral edema.  Skin:  Dry, no rashes.  Neuro:  Alert and oriented x4. Moves all four extremities to command. CN II-XII intact.        Agustin Cree, MD  Resident  04/17/22 Carollee Massed

## 2022-04-17 NOTE — ED Provider Notes (Signed)
ED Attending Attestation Note     Date of evaluation: 04/17/2022    This patient was seen by the resident.  I have seen and examined the patient, agree with the workup, evaluation, management and diagnosis. The care plan has been discussed.  My assessment reveals 89 year old female presenting to the hospital for evaluation of epistaxis.  Patient with bleeding noted from the left nares.  She is not on any anticoagulation.  She denies any trauma.  On assessment patient is noted to have bleeding from the left nares with clot noted there.  She spit up on of the blood clot here in the department.  Afrin utilized for hemostasis as well as a nasal clip     Maryruth Eve, MD  04/17/22 1353

## 2022-04-17 NOTE — ED Triage Notes (Signed)
Spoke with nurse at [facility] who reports that medication list is unreadable when copied.  Nurse reports that pt is on Lasix and Norvas and did take those medications this morning. Pt does not take any blood thinners, including ASA or Plavix

## 2022-04-17 NOTE — Discharge Instructions (Addendum)
You were seen in the ER for a nosebleed. We controlled the nose bleed with a nasal spray called Afrin and a nasal packing called a Rhino Rocket. Please leave the nasal packing in at all times.    The nasal packing needs to be removed in 48 hours. I sent a referral to the ENT Dr. Ginette Otto. Please call his office tomorrow to schedule an appointment within the next day or two to remove the nasal packing.    While the packing is in, we will be prescribing you an antibiotic called Keflex to prevent infection. Please take it every 6 hours while the packing is in place. When it is removed by the ENT doctor, you may stop the antibiotic.    Return to the ER with any new nose bleeds that don't stop or any other concerns.

## 2022-04-17 NOTE — Other (Signed)
This is a notification of a Admission Alert generated from an ADT received from [place]. This patient was admited HQ:[facility] [phone] Discharge Date: Visit Type:Emergency Diagnosis:

## 2022-04-17 NOTE — Other (Signed)
This is a notification of a Discharge Alert generated from an ADT received from [facility]. This patient was admited BP:[facility] Admit [phone] Discharge [phone] Visit Type:Emergency Diagnosis:Epistaxis

## 2022-04-21 ENCOUNTER — Encounter: Admit: 2022-04-21 | Discharge: 2022-04-21 | Payer: MEDICARE | Attending: Otolaryngology

## 2022-04-21 DIAGNOSIS — R040 Epistaxis: Secondary | ICD-10-CM

## 2022-04-21 NOTE — Progress Notes (Signed)
CHIEF COMPLAINT: Epistaxis    HISTORY OF PRESENT ILLNESS:  89 y.o. female who presents with epistaxis which arose spontaneously 4 days ago.  She bled from the left side.  Both anterior and posterior.  She was seen in the emergency room and a [place] was required to control the bleeding.  After about 5 hours the patient pulled it out herself.  She has not bled since.  She is not anticoagulated.  This is her first episode of epistaxis.    PAST MEDICAL HISTORY:   Social History     Tobacco Use   Smoking Status Never   Smokeless Tobacco Never                                                    Social History     Substance and Sexual Activity   Alcohol Use Never                                                    Current Outpatient Medications:     amLODIPine (NORVASC) 5 MG tablet, Take 1 tablet by mouth daily, Disp: 30 tablet, Rfl: 3    famotidine (PEPCID) 40 MG tablet, Take 1 tablet by mouth 2 times daily, Disp: , Rfl:     furosemide (LASIX) 20 MG tablet, Take 1 tablet by mouth daily, Disp: , Rfl:     melatonin 5 MG TABS tablet, Take 1 tablet by mouth nightly, Disp: , Rfl:     venlafaxine (EFFEXOR) 100 MG tablet, TAKE 1 TABLET BY MOUTH TWICE A DAY, Disp: 180 tablet, Rfl: 2    mirtazapine (REMERON) 15 MG tablet, Take 1 tablet by mouth nightly, Disp: , Rfl:                                                  Past Medical History:   Diagnosis Date    Allergic rhinitis     Breast cancer ([place])     Dizziness     GERD (gastroesophageal reflux disease)     Hypertension     Nosebleed     Rash                                                     Past Surgical History:   Procedure Laterality Date    BREAST LUMPECTOMY      HIP SURGERY Left 09/22/2021    LEFT HIP HEMIARTHROPLASTY ANTERIOR APPROACH performed by Lindie Spruce, MD at [facility]: Family history reviewed.  Except as noted in history of present illness, there is no pertinent family history      REVIEW OF SYSTEMS:  All pertinent positive and negative  review of systems included in HPI.  Otherwise, all systems are reviewed and negative.    PHYSICAL EXAMINATION:   GENERAL: wdwn- no acute distress  RESPIRATORY:  No stridor  or respiratory distress  COMMUNICATION :  Normal voice  MENTAL STATUS:  Mood and affect normal, oriented X 3  HEAD AND FACE:  No abnormalities of the skin of face or head  EXTERNAL EARS AND NOSE:  Normal pinnae bilateral  FACIAL MUSCLES:  All branches of facial nerve intact  EXTRAOCULAR MUSCLES: Intact with full range of motion  FACE PALPATION:  No tenderness over sinuses.  Zygomatic arches and orbital rims intact  OTOSCOPY:  Normal external auditory canals, tympanic membranes, and middle ear spaces  TUNING FORKS: Rinne ++ Weber midline at 512 Hz  INTRANASAL:  Septum midline, turbinates normal, meati clear.  Bleeding site identified mid septum on the left side.  LIPS, TEETH, GINGIVA:  Normal mucosa  PHARYNX:  Normal  NECK:  No masses.  LYMPHATIC:  No cervical adenopathy  SALIVARY GLANDS:  No swelling or masses in the parotid or submandibular salivary glands  THYROID:  No goiter or thyroid masses.    IMPRESSION: Left-sided anterior epistaxis.    PLAN: Left naris topically decongested with Afrin and anesthetized with lidocaine on a cotton pledget.  Bleeding site was identified and cauterized with silver nitrate.    FOLLOW-UP: As needed.
